# Patient Record
Sex: Male | Born: 1945 | Race: White | Hispanic: No | Marital: Married | Smoking: Never smoker
Health system: Southern US, Community
[De-identification: ages and names within clinical notes are randomized; demographics above are authoritative.]

## PROBLEM LIST (undated history)

## (undated) DIAGNOSIS — I2510 Atherosclerotic heart disease of native coronary artery without angina pectoris: Secondary | ICD-10-CM

## (undated) DIAGNOSIS — Z8679 Personal history of other diseases of the circulatory system: Secondary | ICD-10-CM

## (undated) DIAGNOSIS — R972 Elevated prostate specific antigen [PSA]: Secondary | ICD-10-CM

## (undated) DIAGNOSIS — K219 Gastro-esophageal reflux disease without esophagitis: Secondary | ICD-10-CM

## (undated) DIAGNOSIS — Z8719 Personal history of other diseases of the digestive system: Secondary | ICD-10-CM

## (undated) DIAGNOSIS — E349 Endocrine disorder, unspecified: Secondary | ICD-10-CM

## (undated) DIAGNOSIS — J9600 Acute respiratory failure, unspecified whether with hypoxia or hypercapnia: Secondary | ICD-10-CM

## (undated) DIAGNOSIS — N183 Chronic kidney disease, stage 3 unspecified: Secondary | ICD-10-CM

## (undated) DIAGNOSIS — M109 Gout, unspecified: Secondary | ICD-10-CM

## (undated) DIAGNOSIS — R001 Bradycardia, unspecified: Secondary | ICD-10-CM

## (undated) DIAGNOSIS — F1090 Alcohol use, unspecified, uncomplicated: Secondary | ICD-10-CM

## (undated) DIAGNOSIS — M48061 Spinal stenosis, lumbar region without neurogenic claudication: Secondary | ICD-10-CM

## (undated) DIAGNOSIS — I441 Atrioventricular block, second degree: Secondary | ICD-10-CM

## (undated) DIAGNOSIS — G4730 Sleep apnea, unspecified: Secondary | ICD-10-CM

## (undated) DIAGNOSIS — K4020 Bilateral inguinal hernia, without obstruction or gangrene, not specified as recurrent: Secondary | ICD-10-CM

## (undated) DIAGNOSIS — R0602 Shortness of breath: Secondary | ICD-10-CM

## (undated) DIAGNOSIS — C801 Malignant (primary) neoplasm, unspecified: Secondary | ICD-10-CM

## (undated) DIAGNOSIS — Z789 Other specified health status: Secondary | ICD-10-CM

## (undated) DIAGNOSIS — N400 Enlarged prostate without lower urinary tract symptoms: Secondary | ICD-10-CM

## (undated) DIAGNOSIS — D72829 Elevated white blood cell count, unspecified: Secondary | ICD-10-CM

## (undated) DIAGNOSIS — K5730 Diverticulosis of large intestine without perforation or abscess without bleeding: Secondary | ICD-10-CM

## (undated) DIAGNOSIS — E1165 Type 2 diabetes mellitus with hyperglycemia: Secondary | ICD-10-CM

## (undated) DIAGNOSIS — K8050 Calculus of bile duct without cholangitis or cholecystitis without obstruction: Secondary | ICD-10-CM

## (undated) DIAGNOSIS — G4733 Obstructive sleep apnea (adult) (pediatric): Secondary | ICD-10-CM

## (undated) DIAGNOSIS — I071 Rheumatic tricuspid insufficiency: Secondary | ICD-10-CM

## (undated) DIAGNOSIS — G4700 Insomnia, unspecified: Secondary | ICD-10-CM

## (undated) DIAGNOSIS — K8020 Calculus of gallbladder without cholecystitis without obstruction: Secondary | ICD-10-CM

## (undated) DIAGNOSIS — M1990 Unspecified osteoarthritis, unspecified site: Secondary | ICD-10-CM

## (undated) DIAGNOSIS — H409 Unspecified glaucoma: Secondary | ICD-10-CM

## (undated) DIAGNOSIS — I10 Essential (primary) hypertension: Secondary | ICD-10-CM

## (undated) DIAGNOSIS — Z7982 Long term (current) use of aspirin: Secondary | ICD-10-CM

## (undated) DIAGNOSIS — M5137 Other intervertebral disc degeneration, lumbosacral region: Secondary | ICD-10-CM

## (undated) DIAGNOSIS — M51379 Other intervertebral disc degeneration, lumbosacral region without mention of lumbar back pain or lower extremity pain: Secondary | ICD-10-CM

## (undated) DIAGNOSIS — M419 Scoliosis, unspecified: Secondary | ICD-10-CM

## (undated) DIAGNOSIS — K2270 Barrett's esophagus without dysplasia: Secondary | ICD-10-CM

## (undated) DIAGNOSIS — N529 Male erectile dysfunction, unspecified: Secondary | ICD-10-CM

## (undated) DIAGNOSIS — D649 Anemia, unspecified: Secondary | ICD-10-CM

## (undated) DIAGNOSIS — I499 Cardiac arrhythmia, unspecified: Secondary | ICD-10-CM

## (undated) DIAGNOSIS — Z960 Presence of urogenital implants: Secondary | ICD-10-CM

## (undated) DIAGNOSIS — E782 Mixed hyperlipidemia: Secondary | ICD-10-CM

## (undated) DIAGNOSIS — C61 Malignant neoplasm of prostate: Secondary | ICD-10-CM

## (undated) HISTORY — DX: Elevated white blood cell count, unspecified: D72.829

## (undated) HISTORY — PX: APPENDECTOMY: SHX54

## (undated) HISTORY — DX: Unspecified glaucoma: H40.9

## (undated) HISTORY — DX: Type 2 diabetes mellitus with hyperglycemia: E11.65

## (undated) HISTORY — PX: CORONARY ANGIOPLASTY WITH STENT PLACEMENT: SHX49

## (undated) HISTORY — PX: TOTAL SHOULDER ARTHROPLASTY: SHX126

## (undated) HISTORY — DX: Unspecified osteoarthritis, unspecified site: M19.90

## (undated) HISTORY — DX: Bradycardia, unspecified: R00.1

## (undated) HISTORY — DX: Essential (primary) hypertension: I10

## (undated) HISTORY — DX: Elevated prostate specific antigen (PSA): R97.20

## (undated) HISTORY — DX: Other intervertebral disc degeneration, lumbosacral region: M51.37

## (undated) HISTORY — PX: CHOLECYSTECTOMY: SHX55

## (undated) HISTORY — DX: Endocrine disorder, unspecified: E34.9

## (undated) HISTORY — DX: Mixed hyperlipidemia: E78.2

## (undated) HISTORY — PX: UPPER GI ENDOSCOPY: SHX6162

## (undated) HISTORY — DX: Obstructive sleep apnea (adult) (pediatric): G47.33

## (undated) HISTORY — PX: COLONOSCOPY: SHX174

## (undated) HISTORY — DX: Shortness of breath: R06.02

## (undated) HISTORY — DX: Sleep apnea, unspecified: G47.30

## (undated) HISTORY — DX: Barrett's esophagus without dysplasia: K22.70

## (undated) HISTORY — DX: Rheumatic tricuspid insufficiency: I07.1

## (undated) HISTORY — DX: Other intervertebral disc degeneration, lumbosacral region without mention of lumbar back pain or lower extremity pain: M51.379

## (undated) HISTORY — PX: TONSILLECTOMY: SUR1361

## (undated) HISTORY — DX: Atherosclerotic heart disease of native coronary artery without angina pectoris: I25.10

---

## 1998-03-05 HISTORY — PX: PENILE PROSTHESIS IMPLANT: SHX240

## 1999-03-06 HISTORY — PX: CORONARY ANGIOPLASTY WITH STENT PLACEMENT: SHX49

## 2008-05-14 ENCOUNTER — Ambulatory Visit: Payer: Self-pay | Admitting: Orthopedic Surgery

## 2008-08-30 ENCOUNTER — Emergency Department: Payer: Self-pay | Admitting: Emergency Medicine

## 2008-09-11 ENCOUNTER — Emergency Department: Payer: Self-pay | Admitting: Emergency Medicine

## 2008-10-05 ENCOUNTER — Ambulatory Visit: Payer: Self-pay | Admitting: Urology

## 2008-10-25 ENCOUNTER — Ambulatory Visit: Payer: Self-pay | Admitting: Gastroenterology

## 2009-10-06 ENCOUNTER — Other Ambulatory Visit: Payer: Self-pay | Admitting: Urology

## 2009-10-19 ENCOUNTER — Encounter: Payer: Self-pay | Admitting: Urology

## 2009-11-03 ENCOUNTER — Encounter: Payer: Self-pay | Admitting: Urology

## 2010-02-24 ENCOUNTER — Ambulatory Visit: Payer: Self-pay | Admitting: General Practice

## 2010-03-27 ENCOUNTER — Ambulatory Visit: Payer: Self-pay | Admitting: General Practice

## 2010-04-10 ENCOUNTER — Inpatient Hospital Stay: Payer: Self-pay | Admitting: General Practice

## 2010-04-13 LAB — PATHOLOGY REPORT

## 2010-06-01 ENCOUNTER — Ambulatory Visit: Payer: Self-pay | Admitting: Internal Medicine

## 2010-06-04 ENCOUNTER — Ambulatory Visit: Payer: Self-pay | Admitting: Internal Medicine

## 2010-07-04 ENCOUNTER — Ambulatory Visit: Payer: Self-pay | Admitting: Internal Medicine

## 2010-08-04 ENCOUNTER — Ambulatory Visit: Payer: Self-pay | Admitting: Internal Medicine

## 2010-09-03 ENCOUNTER — Ambulatory Visit: Payer: Self-pay | Admitting: Internal Medicine

## 2010-10-04 ENCOUNTER — Ambulatory Visit: Payer: Self-pay | Admitting: Internal Medicine

## 2010-11-04 ENCOUNTER — Ambulatory Visit: Payer: Self-pay | Admitting: Internal Medicine

## 2010-12-04 ENCOUNTER — Ambulatory Visit: Payer: Self-pay | Admitting: Internal Medicine

## 2011-01-04 ENCOUNTER — Ambulatory Visit: Payer: Self-pay | Admitting: Internal Medicine

## 2011-02-03 ENCOUNTER — Ambulatory Visit: Payer: Self-pay | Admitting: Internal Medicine

## 2011-03-06 ENCOUNTER — Ambulatory Visit: Payer: Self-pay | Admitting: Internal Medicine

## 2011-04-02 LAB — CANCER CENTER HEMATOCRIT: HCT: 45.3 % (ref 40.0–52.0)

## 2011-04-06 ENCOUNTER — Ambulatory Visit: Payer: Self-pay | Admitting: Internal Medicine

## 2011-04-30 LAB — CANCER CENTER HEMATOCRIT: HCT: 46.9 % (ref 40.0–52.0)

## 2011-05-04 ENCOUNTER — Ambulatory Visit: Payer: Self-pay | Admitting: Internal Medicine

## 2011-05-28 LAB — CBC CANCER CENTER
Basophil %: 0.5 %
Eosinophil %: 1.4 %
HGB: 15 g/dL (ref 13.0–18.0)
Lymphocyte %: 25.7 %
MCH: 26.1 pg (ref 26.0–34.0)
MCHC: 32.1 g/dL (ref 32.0–36.0)
Monocyte #: 1.3 x10 3/mm — ABNORMAL HIGH (ref 0.0–0.7)
Monocyte %: 14.8 %
Neutrophil #: 5.2 x10 3/mm (ref 1.4–6.5)
Platelet: 188 x10 3/mm (ref 150–440)
RBC: 5.77 10*6/uL (ref 4.40–5.90)
WBC: 9 x10 3/mm (ref 3.8–10.6)

## 2011-06-04 ENCOUNTER — Ambulatory Visit: Payer: Self-pay | Admitting: Internal Medicine

## 2011-07-04 ENCOUNTER — Ambulatory Visit: Payer: Self-pay | Admitting: Internal Medicine

## 2011-07-25 LAB — CANCER CENTER HEMATOCRIT: HCT: 47.9 % (ref 40.0–52.0)

## 2011-08-04 ENCOUNTER — Ambulatory Visit: Payer: Self-pay | Admitting: Internal Medicine

## 2011-09-19 ENCOUNTER — Ambulatory Visit: Payer: Self-pay | Admitting: Internal Medicine

## 2011-09-20 LAB — PSA: PSA: 6 ng/mL — ABNORMAL HIGH (ref 0.0–4.0)

## 2011-10-04 ENCOUNTER — Ambulatory Visit: Payer: Self-pay | Admitting: Internal Medicine

## 2011-10-31 LAB — CBC CANCER CENTER
Basophil #: 0.1 x10 3/mm (ref 0.0–0.1)
Basophil %: 1.1 %
Eosinophil #: 0.1 x10 3/mm (ref 0.0–0.7)
Eosinophil %: 1.7 %
HCT: 47.7 % (ref 40.0–52.0)
HGB: 14.8 g/dL (ref 13.0–18.0)
Lymphocyte #: 2.3 x10 3/mm (ref 1.0–3.6)
Lymphocyte %: 30.2 %
MCH: 25.5 pg — ABNORMAL LOW (ref 26.0–34.0)
MCHC: 31.1 g/dL — ABNORMAL LOW (ref 32.0–36.0)
MCV: 82 fL (ref 80–100)
Monocyte #: 1.2 x10 3/mm — ABNORMAL HIGH (ref 0.2–1.0)
Neutrophil %: 51.8 %
RBC: 5.8 10*6/uL (ref 4.40–5.90)
RDW: 16.2 % — ABNORMAL HIGH (ref 11.5–14.5)
WBC: 7.7 x10 3/mm (ref 3.8–10.6)

## 2011-11-04 ENCOUNTER — Ambulatory Visit: Payer: Self-pay | Admitting: Internal Medicine

## 2011-12-26 ENCOUNTER — Ambulatory Visit: Payer: Self-pay | Admitting: Internal Medicine

## 2011-12-26 LAB — CANCER CENTER HEMATOCRIT: HCT: 48.2 % (ref 40.0–52.0)

## 2012-01-04 ENCOUNTER — Ambulatory Visit: Payer: Self-pay | Admitting: Internal Medicine

## 2012-02-03 ENCOUNTER — Ambulatory Visit: Payer: Self-pay | Admitting: Internal Medicine

## 2012-02-20 LAB — CANCER CENTER HEMATOCRIT: HCT: 46.8 % (ref 40.0–52.0)

## 2012-03-05 ENCOUNTER — Ambulatory Visit: Payer: Self-pay | Admitting: Internal Medicine

## 2012-03-05 HISTORY — PX: SHOULDER SURGERY: SHX246

## 2012-04-05 ENCOUNTER — Ambulatory Visit: Payer: Self-pay | Admitting: Internal Medicine

## 2012-04-30 LAB — CANCER CENTER HEMATOCRIT: HCT: 48.3 % (ref 40.0–52.0)

## 2012-05-01 LAB — PSA: PSA: 3.2 ng/mL (ref 0.0–4.0)

## 2012-05-03 ENCOUNTER — Ambulatory Visit: Payer: Self-pay | Admitting: Internal Medicine

## 2012-06-10 ENCOUNTER — Ambulatory Visit: Payer: Self-pay | Admitting: Internal Medicine

## 2012-06-11 LAB — CBC CANCER CENTER
Basophil %: 0.9 %
HCT: 44.6 % (ref 40.0–52.0)
HGB: 14.3 g/dL (ref 13.0–18.0)
Lymphocyte #: 2.3 x10 3/mm (ref 1.0–3.6)
Lymphocyte %: 28.3 %
MCH: 27.8 pg (ref 26.0–34.0)
MCHC: 32.1 g/dL (ref 32.0–36.0)
MCV: 87 fL (ref 80–100)
Monocyte #: 1.1 x10 3/mm — ABNORMAL HIGH (ref 0.2–1.0)
Monocyte %: 13.6 %
Neutrophil #: 4.6 x10 3/mm (ref 1.4–6.5)
Platelet: 210 x10 3/mm (ref 150–440)

## 2012-07-03 ENCOUNTER — Ambulatory Visit: Payer: Self-pay | Admitting: Internal Medicine

## 2012-07-23 LAB — CANCER CENTER HEMATOCRIT: HCT: 44.9 % (ref 40.0–52.0)

## 2012-08-03 ENCOUNTER — Ambulatory Visit: Payer: Self-pay | Admitting: Internal Medicine

## 2012-09-09 ENCOUNTER — Ambulatory Visit: Payer: Self-pay | Admitting: Internal Medicine

## 2012-09-10 LAB — CANCER CENTER HEMATOCRIT: HCT: 44.6 %

## 2012-10-03 ENCOUNTER — Ambulatory Visit: Payer: Self-pay | Admitting: Internal Medicine

## 2012-10-21 ENCOUNTER — Ambulatory Visit: Payer: Self-pay | Admitting: Internal Medicine

## 2012-11-05 ENCOUNTER — Ambulatory Visit: Payer: Self-pay | Admitting: Internal Medicine

## 2012-11-10 ENCOUNTER — Ambulatory Visit: Payer: Self-pay | Admitting: Internal Medicine

## 2012-12-03 ENCOUNTER — Ambulatory Visit: Payer: Self-pay | Admitting: Internal Medicine

## 2012-12-31 LAB — CBC CANCER CENTER
Basophil #: 0.1 x10 3/mm (ref 0.0–0.1)
Basophil %: 0.9 %
Eosinophil #: 0.2 x10 3/mm (ref 0.0–0.7)
HCT: 47.6 % (ref 40.0–52.0)
HGB: 15.1 g/dL (ref 13.0–18.0)
Lymphocyte %: 28.3 %
MCH: 28.4 pg (ref 26.0–34.0)
MCHC: 31.8 g/dL — ABNORMAL LOW (ref 32.0–36.0)
MCV: 89 fL (ref 80–100)
Monocyte #: 1.4 x10 3/mm — ABNORMAL HIGH (ref 0.2–1.0)
Monocyte %: 15.1 %
Neutrophil %: 53.3 %
RDW: 14.9 % — ABNORMAL HIGH (ref 11.5–14.5)
WBC: 9.6 x10 3/mm (ref 3.8–10.6)

## 2013-01-03 ENCOUNTER — Ambulatory Visit: Payer: Self-pay | Admitting: Internal Medicine

## 2013-02-18 ENCOUNTER — Ambulatory Visit: Payer: Self-pay | Admitting: Internal Medicine

## 2013-02-18 LAB — CANCER CENTER HEMATOCRIT: HCT: 47.5 % (ref 40.0–52.0)

## 2013-03-05 ENCOUNTER — Ambulatory Visit: Payer: Self-pay | Admitting: Internal Medicine

## 2013-04-14 ENCOUNTER — Ambulatory Visit: Payer: Self-pay | Admitting: Internal Medicine

## 2013-04-15 LAB — CANCER CENTER HEMATOCRIT: HCT: 46.6 % (ref 40.0–52.0)

## 2013-05-03 ENCOUNTER — Ambulatory Visit: Payer: Self-pay | Admitting: Internal Medicine

## 2013-06-09 ENCOUNTER — Ambulatory Visit: Payer: Self-pay | Admitting: Internal Medicine

## 2013-06-10 LAB — CBC CANCER CENTER
Basophil #: 0.1 x10 3/mm (ref 0.0–0.1)
Basophil %: 1 %
EOS ABS: 0.2 x10 3/mm (ref 0.0–0.7)
EOS PCT: 2.2 %
HCT: 47.9 % (ref 40.0–52.0)
HGB: 15.1 g/dL (ref 13.0–18.0)
Lymphocyte #: 2.5 x10 3/mm (ref 1.0–3.6)
Lymphocyte %: 30.4 %
MCH: 27.5 pg (ref 26.0–34.0)
MCHC: 31.5 g/dL — ABNORMAL LOW (ref 32.0–36.0)
MCV: 88 fL (ref 80–100)
MONO ABS: 1 x10 3/mm (ref 0.2–1.0)
MONOS PCT: 12.7 %
NEUTROS ABS: 4.4 x10 3/mm (ref 1.4–6.5)
Neutrophil %: 53.7 %
Platelet: 224 x10 3/mm (ref 150–440)
RBC: 5.47 10*6/uL (ref 4.40–5.90)
RDW: 15.1 % — ABNORMAL HIGH (ref 11.5–14.5)
WBC: 8.1 x10 3/mm (ref 3.8–10.6)

## 2013-06-10 LAB — IRON AND TIBC
IRON: 161 ug/dL (ref 65–175)
Iron Bind.Cap.(Total): 477 ug/dL — ABNORMAL HIGH (ref 250–450)
Iron Saturation: 34 %
UNBOUND IRON-BIND. CAP.: 316 ug/dL

## 2013-06-10 LAB — FERRITIN: Ferritin (ARMC): 9 ng/mL (ref 8–388)

## 2013-07-03 ENCOUNTER — Ambulatory Visit: Payer: Self-pay | Admitting: Internal Medicine

## 2013-08-10 ENCOUNTER — Ambulatory Visit: Payer: Self-pay | Admitting: Internal Medicine

## 2013-08-10 LAB — HEMATOCRIT: HCT: 48 % (ref 40.0–52.0)

## 2013-09-02 ENCOUNTER — Ambulatory Visit: Payer: Self-pay | Admitting: Internal Medicine

## 2013-09-30 DIAGNOSIS — R0602 Shortness of breath: Secondary | ICD-10-CM | POA: Insufficient documentation

## 2013-09-30 DIAGNOSIS — M1990 Unspecified osteoarthritis, unspecified site: Secondary | ICD-10-CM | POA: Insufficient documentation

## 2013-09-30 HISTORY — DX: Shortness of breath: R06.02

## 2013-09-30 HISTORY — DX: Unspecified osteoarthritis, unspecified site: M19.90

## 2013-10-12 ENCOUNTER — Ambulatory Visit: Payer: Self-pay | Admitting: Internal Medicine

## 2013-10-12 LAB — CBC CANCER CENTER
BASOS ABS: 0.1 x10 3/mm (ref 0.0–0.1)
BASOS PCT: 0.9 %
Eosinophil #: 0.1 x10 3/mm (ref 0.0–0.7)
Eosinophil %: 1.7 %
HCT: 47 % (ref 40.0–52.0)
HGB: 14.8 g/dL (ref 13.0–18.0)
LYMPHS PCT: 29.2 %
Lymphocyte #: 2.5 x10 3/mm (ref 1.0–3.6)
MCH: 28.2 pg (ref 26.0–34.0)
MCHC: 31.5 g/dL — ABNORMAL LOW (ref 32.0–36.0)
MCV: 90 fL (ref 80–100)
Monocyte #: 1.2 x10 3/mm — ABNORMAL HIGH (ref 0.2–1.0)
Monocyte %: 13.7 %
NEUTROS PCT: 54.5 %
Neutrophil #: 4.6 x10 3/mm (ref 1.4–6.5)
Platelet: 254 x10 3/mm (ref 150–440)
RBC: 5.24 10*6/uL (ref 4.40–5.90)
RDW: 14.9 % — ABNORMAL HIGH (ref 11.5–14.5)
WBC: 8.5 x10 3/mm (ref 3.8–10.6)

## 2013-10-12 LAB — OCCULT BLOOD X 1 CARD TO LAB, STOOL
OCCULT BLOOD, FECES: NEGATIVE
Occult Blood, Feces: NEGATIVE
Occult Blood, Feces: NEGATIVE

## 2013-11-03 ENCOUNTER — Ambulatory Visit: Payer: Self-pay | Admitting: Internal Medicine

## 2013-11-10 ENCOUNTER — Ambulatory Visit: Payer: Self-pay | Admitting: Gastroenterology

## 2013-11-12 LAB — PATHOLOGY REPORT

## 2013-11-16 DIAGNOSIS — E1165 Type 2 diabetes mellitus with hyperglycemia: Secondary | ICD-10-CM

## 2013-11-16 DIAGNOSIS — IMO0002 Reserved for concepts with insufficient information to code with codable children: Secondary | ICD-10-CM

## 2013-11-16 DIAGNOSIS — E119 Type 2 diabetes mellitus without complications: Secondary | ICD-10-CM | POA: Insufficient documentation

## 2013-11-16 DIAGNOSIS — R972 Elevated prostate specific antigen [PSA]: Secondary | ICD-10-CM

## 2013-11-16 HISTORY — DX: Elevated prostate specific antigen (PSA): R97.20

## 2013-11-16 HISTORY — DX: Reserved for concepts with insufficient information to code with codable children: IMO0002

## 2013-11-16 HISTORY — DX: Type 2 diabetes mellitus with hyperglycemia: E11.65

## 2014-02-23 ENCOUNTER — Ambulatory Visit: Payer: Self-pay | Admitting: Internal Medicine

## 2014-04-20 DIAGNOSIS — G4733 Obstructive sleep apnea (adult) (pediatric): Secondary | ICD-10-CM | POA: Insufficient documentation

## 2014-04-20 HISTORY — DX: Obstructive sleep apnea (adult) (pediatric): G47.33

## 2014-04-29 DIAGNOSIS — I071 Rheumatic tricuspid insufficiency: Secondary | ICD-10-CM

## 2014-04-29 HISTORY — DX: Rheumatic tricuspid insufficiency: I07.1

## 2014-10-11 DIAGNOSIS — I10 Essential (primary) hypertension: Secondary | ICD-10-CM | POA: Insufficient documentation

## 2014-10-11 HISTORY — DX: Essential (primary) hypertension: I10

## 2014-10-29 ENCOUNTER — Ambulatory Visit (INDEPENDENT_AMBULATORY_CARE_PROVIDER_SITE_OTHER): Payer: Medicare Other | Admitting: Urology

## 2014-10-29 ENCOUNTER — Encounter: Payer: Self-pay | Admitting: Urology

## 2014-10-29 VITALS — BP 168/91 | HR 64 | Ht 69.0 in | Wt 184.3 lb

## 2014-10-29 DIAGNOSIS — E782 Mixed hyperlipidemia: Secondary | ICD-10-CM

## 2014-10-29 DIAGNOSIS — N401 Enlarged prostate with lower urinary tract symptoms: Secondary | ICD-10-CM

## 2014-10-29 DIAGNOSIS — R972 Elevated prostate specific antigen [PSA]: Secondary | ICD-10-CM

## 2014-10-29 DIAGNOSIS — I10 Essential (primary) hypertension: Secondary | ICD-10-CM

## 2014-10-29 DIAGNOSIS — E291 Testicular hypofunction: Secondary | ICD-10-CM | POA: Diagnosis not present

## 2014-10-29 DIAGNOSIS — N529 Male erectile dysfunction, unspecified: Secondary | ICD-10-CM

## 2014-10-29 DIAGNOSIS — I2510 Atherosclerotic heart disease of native coronary artery without angina pectoris: Secondary | ICD-10-CM

## 2014-10-29 HISTORY — DX: Essential (primary) hypertension: I10

## 2014-10-29 HISTORY — DX: Mixed hyperlipidemia: E78.2

## 2014-10-29 HISTORY — DX: Atherosclerotic heart disease of native coronary artery without angina pectoris: I25.10

## 2014-10-29 LAB — URINALYSIS, COMPLETE
Bilirubin, UA: NEGATIVE
Glucose, UA: NEGATIVE
LEUKOCYTES UA: NEGATIVE
Nitrite, UA: NEGATIVE
PROTEIN UA: NEGATIVE
RBC, UA: NEGATIVE
SPEC GRAV UA: 1.025 (ref 1.005–1.030)
Urobilinogen, Ur: 0.2 mg/dL (ref 0.2–1.0)
pH, UA: 5.5 (ref 5.0–7.5)

## 2014-10-29 LAB — MICROSCOPIC EXAMINATION
BACTERIA UA: NONE SEEN
EPITHELIAL CELLS (NON RENAL): NONE SEEN /HPF (ref 0–10)
RBC MICROSCOPIC, UA: NONE SEEN /HPF (ref 0–?)
WBC, UA: NONE SEEN /hpf (ref 0–?)

## 2014-10-29 MED ORDER — SULFAMETHOXAZOLE-TRIMETHOPRIM 800-160 MG PO TABS: 1.0000 | ORAL_TABLET | Freq: Two times a day (BID) | ORAL | Status: DC

## 2014-10-29 NOTE — Progress Notes (Signed)
10/29/2014 4:22 PM   Jimmy Mcintyre 1945-11-10 [id number]  Referring provider: No referring provider defined for this encounter.  Chief Complaint  Patient presents with  . Elevated PSA    New Patient    HPI:  1 - Elevated PSA - rising PSA x several, has been counseled for Urol eval x several by PCP but now agreeable. On Finasteride x 15 years. Recent PSA History: all values ON FINASTERIDE AND TESTOSTERONE 11/2013 - 5.09 06/2014 - 7.19; 09/2014 - 7.72  2 - Hypogonadism  - On androgel x years for symptomatic hypogonadism, mostly from extreme fatigue. Denies h/o polycythemia.  3 - Erectile Dysfunction - s/p IPP implant and revision x2, most recently in [place] around 2010. Now with severe SST deformity and he is very reluctant to use.  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  PMH sig for CAD/Stent (now not limiting whatsoever), Dm2, OA, GERD. His PCP is Georgie Chard MD with [name].      PMH: Past Medical History  Diagnosis Date  . Benign essential HTN 10/11/2014  . Arteriosclerosis of coronary artery 10/29/2014    Overview:  pci stent of lad   . Essential (primary) hypertension 10/29/2014  . TI (tricuspid incompetence) 04/29/2014  . Obstructive apnea 04/20/2014  . Arthritis, degenerative 09/30/2013    Overview:   a.  Shoulders severe.   b.  Cervical spine.   c.  Lumbar spine   . Diabetes mellitus type 2, uncontrolled 11/16/2013  . Abnormal prostate specific antigen 11/16/2013  . 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 Breath shortness 09/30/2013  . Glaucoma   . Sleep apnea     Surgical History: Past Surgical History  Procedure Laterality Date  . Coronary angioplasty with stent placement  2001  . Penile prosthesis implant  2000  . Shoulder surgery Right 2014    Home Medications:    Medication List       This list is accurate  as of: 10/29/14  4:22 PM.  Always use your most recent med list.               amLODipine-benazepril 5-20 MG per capsule  Commonly known as:  LOTREL  TAKE 1 CAPSULE BY MOUTH EVERY DAY     ANDROGEL 20.25 MG/1.25GM (1.62%) Gel  Generic drug:  Testosterone  Apply topically.     aspirin 325 MG tablet  Take by mouth.     cyanocobalamin 1000 MCG/ML injection  Commonly known as:  (VITAMIN B-12)  Inject into the muscle.     finasteride 5 MG tablet  Commonly known as:  PROSCAR  Take by mouth.     fluticasone 50 MCG/ACT nasal spray  Commonly known as:  FLONASE  Place into the nose.     gemfibrozil 600 MG tablet  Commonly known as:  LOPID  TAKE 1 TABLET TWICE A DAY AS DIRECTED     glimepiride 2 MG tablet  Commonly known as:  AMARYL  Take by mouth.     HYDROcodone-acetaminophen 5-325 MG per tablet  Commonly known as:  NORCO/VICODIN  Take by mouth.     JUBLIA 10 % Soln  Generic drug:  Efinaconazole  APPLY TO NAILS EVERY DAY     loratadine-pseudoephedrine 5-120 MG per tablet  Commonly known as:  CLARITIN-D 12-hour  Take by mouth.     metoprolol succinate 25 MG 24 hr tablet  Commonly  known as:  TOPROL-XL  Take by mouth.     nabumetone 750 MG tablet  Commonly known as:  RELAFEN  TAKE 1 TABLET TWICE A DAY AS NEEDED FOR PAIN     omeprazole 20 MG capsule  Commonly known as:  PRILOSEC  Take by mouth.     simvastatin 20 MG tablet  Commonly known as:  ZOCOR  Take by mouth.     sulfamethoxazole-trimethoprim 800-160 MG per tablet  Commonly known as:  BACTRIM DS,SEPTRA DS  Take 1 tablet by mouth every 12 (twelve) hours. Begin 3 days before prostate biopsy.     zolpidem 12.5 MG CR tablet  Commonly known as:  AMBIEN CR  Take by mouth.        Allergies: No Known Allergies  Family History: Family History  Problem Relation Age of Onset  . Prostate cancer Neg Hx   . Bladder Cancer Neg Hx     Social History:  reports that he has never smoked. He does not have any  smokeless tobacco history on file. He reports that he drinks alcohol. He reports that he does not use illicit drugs.  ROS: UROLOGY Frequent Urination?: Yes Hard to postpone urination?: Yes Burning/pain with urination?: No Get up at night to urinate?: Yes Leakage of urine?: Yes Urine stream starts and stops?: No Trouble starting stream?: Yes Do you have to strain to urinate?: No Blood in urine?: No Urinary tract infection?: No Sexually transmitted disease?: No Injury to kidneys or bladder?: No Painful intercourse?: No Weak stream?: Yes Erection problems?: Yes Penile pain?: Yes  Gastrointestinal Nausea?: No Vomiting?: No Indigestion/heartburn?: No Diarrhea?: No Constipation?: No  Constitutional Fever: No Night sweats?: No Weight loss?: No Fatigue?: No  Skin Skin rash/lesions?: No Itching?: No  Eyes Blurred vision?: Yes Double vision?: No  Ears/Nose/Throat Sore throat?: No Sinus problems?: Yes  Hematologic/Lymphatic Swollen glands?: No Easy bruising?: No  Cardiovascular Leg swelling?: No Chest pain?: No  Respiratory Cough?: No Shortness of breath?: Yes  Endocrine Excessive thirst?: No  Musculoskeletal Back pain?: Yes Joint pain?: Yes  Neurological Headaches?: No Dizziness?: No  Psychologic Depression?: No Anxiety?: No  Physical Exam: BP 168/91 mmHg  Pulse 64  Ht 5\' 9"  (1.753 m)  Wt 184 lb 4.8 oz (83.598 kg)  BMI 27.20 kg/m2  Constitutional:  Alert and oriented, No acute distress. HEENT: [place] AT, moist mucus membranes.  Trachea midline, no masses. Cardiovascular: No clubbing, cyanosis, or edema. Respiratory: Normal respiratory effort, no increased work of breathing. GI: Abdomen is soft, nontender, nondistended, no abdominal masses GU: No CVA tenderness. IPP in place w/o erosion, pump in Rt hemiscrotum. DRE 45gm with significant left lateral induraiton.  Skin: No rashes, bruises or suspicious lesions. Lymph: No cervical or inguinal  adenopathy. Neurologic: Grossly intact, no focal deficits, moving all 4 extremities. Psychiatric: Normal mood and affect.  Laboratory Data: Lab Results  Component Value Date   WBC 8.5 10/12/2013   HGB 14.8 10/12/2013   HCT 47.0 10/12/2013   MCV 90 10/12/2013   PLT 254 10/12/2013     Urinalysis No results found for: COLORURINE, APPEARANCEUR, LABSPEC, PHURINE, GLUCOSEU, HGBUR, BILIRUBINUR, KETONESUR, PROTEINUR, UROBILINOGEN, NITRITE, LEUKOCYTESUR  Pertinent Imaging:   Assessment & Plan:   1 - Elevated PSA - rising PSA and absolute value, which corrected for finasteride at over 15 is concerning. Also sig left lateral induration on exam. Although he does have some comorbdity and age over 27 it is well controlled and he has excellent functional status. Certainly rec  biopsy to rule out high grade tumor, as would really need to come off testosterone if significant malignancy present. Risks, benefits, alternatives discussed as well as peri-procedure course in detail. He wants to proceed. Bactrim to start 3 days prior.  2 - Hypogonadism  - risks of this medication (increased CV disesae, prostate cancer, all cause-mortality) discussed. At this point he elects to continue.   3 - Erectile Dysfunction - has funcitoning IPP, just with some glans hypermobility which he is not interested in adressing at present. 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   Return in about 3 weeks (around 11/19/2014).  Alexis Frock, [facility] [address] [place], [place] [address] [phone]

## 2014-11-29 ENCOUNTER — Telehealth: Payer: Self-pay | Admitting: Urology

## 2014-11-29 ENCOUNTER — Telehealth: Payer: Self-pay

## 2014-11-29 NOTE — Telephone Encounter (Signed)
Pt called stating he is having a reaction to abx, bactrim. Medication was given prior to prostate bx. Pt states he developed a rash and his blood sugars jumped to 160 (his normal range is around 100). Pt has stopped abx. In office the day of procedure he will receive gentamycin injection and Levaquin PO. Can pt have both of these and does he need a different abx leading up to procedure? Pt also stated he does have an artifical shoulder and a cardiac stent. Please advise.

## 2014-11-30 ENCOUNTER — Other Ambulatory Visit: Payer: Self-pay | Admitting: Urology

## 2014-11-30 ENCOUNTER — Ambulatory Visit (INDEPENDENT_AMBULATORY_CARE_PROVIDER_SITE_OTHER): Payer: Medicare Other | Admitting: Urology

## 2014-11-30 VITALS — BP 153/96 | HR 67 | Ht 69.0 in | Wt 186.8 lb

## 2014-11-30 DIAGNOSIS — R972 Elevated prostate specific antigen [PSA]: Secondary | ICD-10-CM | POA: Diagnosis not present

## 2014-11-30 MED ORDER — GENTAMICIN SULFATE 40 MG/ML IJ SOLN
80.0000 mg | Freq: Once | INTRAMUSCULAR | Status: AC
  Administered 2014-11-30: 80 mg via INTRAMUSCULAR

## 2014-11-30 MED ORDER — LEVOFLOXACIN 500 MG PO TABS
500.0000 mg | ORAL_TABLET | Freq: Once | ORAL | Status: AC
  Administered 2014-11-30: 500 mg via ORAL

## 2014-11-30 NOTE — Progress Notes (Signed)
Prostate Biopsy Procedure   Informed consent was obtained after discussing risks/benefits of the procedure.  A time out was performed to ensure correct patient identity.   - Gentamicin given prophylactically - Levaquin 500 mg administered PO -Transrectal Ultrasound performed revealing a 42 gm prostate -No significant hypoechoic or median lobe noted - moderate prostatomalacia noted.   Procedure: - Prostate block performed using 10 cc 1% lidocaine and biopsies taken from sextant areas, a total of 12 under ultrasound guidance.  Post-Procedure: - Patient tolerated the procedure well - He was counseled to seek immediate medical attention if experiences any severe pain, significant bleeding, or fevers - Return in one week to discuss biopsy results

## 2014-12-07 ENCOUNTER — Ambulatory Visit: Payer: Medicare Other | Admitting: Urology

## 2014-12-07 LAB — PATHOLOGY REPORT

## 2014-12-09 ENCOUNTER — Ambulatory Visit (INDEPENDENT_AMBULATORY_CARE_PROVIDER_SITE_OTHER): Payer: Medicare Other | Admitting: Urology

## 2014-12-09 VITALS — BP 146/89 | HR 88 | Ht 69.0 in | Wt 186.5 lb

## 2014-12-09 DIAGNOSIS — C61 Malignant neoplasm of prostate: Secondary | ICD-10-CM | POA: Diagnosis not present

## 2014-12-09 DIAGNOSIS — E291 Testicular hypofunction: Secondary | ICD-10-CM | POA: Diagnosis not present

## 2014-12-09 DIAGNOSIS — N529 Male erectile dysfunction, unspecified: Secondary | ICD-10-CM | POA: Diagnosis not present

## 2014-12-09 DIAGNOSIS — R972 Elevated prostate specific antigen [PSA]: Secondary | ICD-10-CM

## 2014-12-09 DIAGNOSIS — N400 Enlarged prostate without lower urinary tract symptoms: Secondary | ICD-10-CM

## 2014-12-09 NOTE — Progress Notes (Signed)
12/09/2014 4:25 PM   [name] June 11, 1945 [id number]  Referring provider: No referring provider defined for this encounter.  Chief Complaint  Patient presents with  . Results    prostate biopsy results    HPI: Patient had persistently elevated PSA despite finasteride use. It was moving up from 5.0-7.72 over a year's time. Transrectal ultrasound biopsies done by Dr. Tammi Klippel results are the right-sided tumor none on the left. Multiple areas in the right side grade 3+4 and 4+3. There is one biopsy at 3+ but basically easily high-risk patient metastasis if treatment is not done. Present time course he has no bone pain. Using AndroGel and Androderm for erectile dysfunction. This was stopped. He can maintain his finasteride. Long discussion be done regarding the various procedure options and complications same.   PMH: Past Medical History  Diagnosis Date  . Benign essential HTN 10/11/2014  . Arteriosclerosis of coronary artery 10/29/2014    Overview:  pci stent of lad   . Essential (primary) hypertension 10/29/2014  . TI (tricuspid incompetence) 04/29/2014  . Obstructive apnea 04/20/2014  . Arthritis, degenerative 09/30/2013    Overview:   a.  Shoulders severe.   b.  Cervical spine.   c.  Lumbar spine   . Diabetes mellitus type 2, uncontrolled 11/16/2013  . Abnormal prostate specific antigen 11/16/2013  . 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 Breath shortness 09/30/2013  . Glaucoma   . Sleep apnea     Surgical History: Past Surgical History  Procedure Laterality Date  . Coronary angioplasty with stent placement  2001  . Penile prosthesis implant  2000  . Shoulder surgery Right 2014    Home Medications:    Medication List       This list is accurate as of: 12/09/14  4:25 PM.  Always use your most recent med list.               amLODipine-benazepril 5-20 MG capsule  Commonly known as:  LOTREL  TAKE 1 CAPSULE BY MOUTH EVERY DAY     ANDROGEL 20.25  MG/1.25GM (1.62%) Gel  Generic drug:  Testosterone  Apply topically.     aspirin 325 MG tablet  Take by mouth.     cyanocobalamin 1000 MCG/ML injection  Commonly known as:  (VITAMIN B-12)  Inject into the muscle.     finasteride 5 MG tablet  Commonly known as:  PROSCAR  Take by mouth.     fluticasone 50 MCG/ACT nasal spray  Commonly known as:  FLONASE  Place into the nose.     gemfibrozil 600 MG tablet  Commonly known as:  LOPID  TAKE 1 TABLET TWICE A DAY AS DIRECTED     glimepiride 2 MG tablet  Commonly known as:  AMARYL  Take by mouth.     HYDROcodone-acetaminophen 5-325 MG tablet  Commonly known as:  NORCO/VICODIN  Take by mouth.     JUBLIA 10 % Soln  Generic drug:  Efinaconazole  APPLY TO NAILS EVERY DAY     loratadine-pseudoephedrine 5-120 MG tablet  Commonly known as:  CLARITIN-D 12-hour  Take by mouth.     metoprolol succinate 25 MG 24 hr tablet  Commonly known as:  TOPROL-XL  Take by mouth.     nabumetone 750 MG tablet  Commonly known as:  RELAFEN  TAKE 1 TABLET TWICE A DAY AS NEEDED FOR PAIN     omeprazole 20 MG capsule  Commonly known as:  PRILOSEC  Take by mouth.  simvastatin 20 MG tablet  Commonly known as:  ZOCOR  Take by mouth.     zolpidem 12.5 MG CR tablet  Commonly known as:  AMBIEN CR  Take by mouth.        Allergies:  Allergies  Allergen Reactions  . Bactrim [Sulfamethoxazole-Trimethoprim] Rash    Family History: Family History  Problem Relation Age of Onset  . Prostate cancer Neg Hx   . Bladder Cancer Neg Hx     Social History:  reports that he has never smoked. He does not have any smokeless tobacco history on file. He reports that he drinks alcohol. He reports that he does not use illicit drugs.  ROS:                                        Physical Exam: BP 146/89 mmHg  Pulse 88  Ht 5\' 9"  (1.753 m)  Wt 186 lb 8 oz (84.596 kg)  BMI 27.53 kg/m2  Constitutional:  Alert and oriented, No  acute distress. HEENT: [place] AT, moist mucus membranes.  Trachea midline, no masses. Cardiovascular: No clubbing, cyanosis, or edema. Respiratory: Normal respiratory effort, no increased work of breathing. GI: Abdomen is soft, nontender, nondistended, no abdominal masses GU: No CVA tenderness. BPH grade 2 over 4 smooth nonnodular Skin: No rashes, bruises or suspicious lesions. Lymph: No cervical or inguinal adenopathy. Neurologic: Grossly intact, no focal deficits, moving all 4 extremities. Psychiatric: Normal mood and affect.  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 All on the right side of the plan is to do external beam radiation to be the best procedure for this patient. His H obviate's doing radical prostatectomy is a 69. All he options were explained to the patient including complications impotence and incontinence irritative bowel symptoms irritative bladder symptoms. I make sure he stops his AndroGel on his original PSA  Refer to Radiation Oncology  There are no diagnoses linked to this encounter.  Return in about 4 weeks (around 01/06/2015) for fu radiation consult.  Collier Flowers, [facility] [address] [place], [place] [address] [phone]

## 2014-12-10 ENCOUNTER — Other Ambulatory Visit: Payer: Self-pay | Admitting: Urology

## 2014-12-14 ENCOUNTER — Telehealth: Payer: Self-pay

## 2014-12-14 NOTE — Telephone Encounter (Signed)
  Oncology Nurse Navigator Documentation  Referral date to RadOnc/MedOnc: 12/14/14 (12/14/14 1000) Navigator Encounter Type: Introductory phone call (12/14/14 1000) Patient Visit Type: Radonc (12/14/14 1000)                    Time Spent with Patient: 15 (12/14/14 1000)   Notified of appt Monday 12/20/14 10:30 with Dr Baruch Gouty. Readback confirmed.

## 2014-12-20 ENCOUNTER — Encounter: Payer: Self-pay | Admitting: Radiation Oncology

## 2014-12-20 ENCOUNTER — Ambulatory Visit
Admission: RE | Admit: 2014-12-20 | Discharge: 2014-12-20 | Disposition: A | Discharge status: Home / Self Care | Payer: Medicare Other | Source: Ambulatory Visit | Attending: Radiation Oncology | Admitting: Radiation Oncology

## 2014-12-20 VITALS — BP 150/89 | HR 64 | Temp 96.5°F | Wt 184.1 lb

## 2014-12-20 DIAGNOSIS — I10 Essential (primary) hypertension: Secondary | ICD-10-CM | POA: Insufficient documentation

## 2014-12-20 DIAGNOSIS — Z7984 Long term (current) use of oral hypoglycemic drugs: Secondary | ICD-10-CM | POA: Insufficient documentation

## 2014-12-20 DIAGNOSIS — Z79899 Other long term (current) drug therapy: Secondary | ICD-10-CM | POA: Insufficient documentation

## 2014-12-20 DIAGNOSIS — I2510 Atherosclerotic heart disease of native coronary artery without angina pectoris: Secondary | ICD-10-CM | POA: Insufficient documentation

## 2014-12-20 DIAGNOSIS — Z510 Encounter for antineoplastic radiation therapy: Secondary | ICD-10-CM | POA: Insufficient documentation

## 2014-12-20 DIAGNOSIS — Z7982 Long term (current) use of aspirin: Secondary | ICD-10-CM | POA: Insufficient documentation

## 2014-12-20 DIAGNOSIS — N400 Enlarged prostate without lower urinary tract symptoms: Secondary | ICD-10-CM | POA: Insufficient documentation

## 2014-12-20 DIAGNOSIS — Z955 Presence of coronary angioplasty implant and graft: Secondary | ICD-10-CM | POA: Insufficient documentation

## 2014-12-20 DIAGNOSIS — E119 Type 2 diabetes mellitus without complications: Secondary | ICD-10-CM | POA: Insufficient documentation

## 2014-12-20 DIAGNOSIS — C61 Malignant neoplasm of prostate: Secondary | ICD-10-CM | POA: Insufficient documentation

## 2014-12-20 NOTE — Consult Note (Signed)
Except an outstanding is perfect of Radiation Oncology NEW PATIENT EVALUATION  Name: Jimmy Mcintyre  MRN: [id number]  Date:   12/20/2014     DOB: 1945/04/14   This 69 y.o. 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 Prostate Cancer    Consult    DIAGNOSIS: The encounter diagnosis was Malignant neoplasm of prostate ([place]).   PREVIOUS INVESTIGATIONS:  Pathology reports reviewed Clinical notes reviewed Bone scan not performed based on low PSA  HPI: Patient is a 69 year old male who was noticed from 1 year time for his PSA accelerated from 5-7.7. This prompted a transrectal ultrasound-guided biopsy showing tumor confined to the right side of his prostate with 6 biopsies positive for Gleason 7 (3+4). One core was 4+3. Patient has no history of bone pain based on his PSA no bone scan was performed. He has very little lower urinary tract symptoms. He has had an umbilical herniorrhaphy repair. He has used AndroGel and Androderm in the past for erectile dysfunction he has since discontinued that. He is currently on finasteride. He is seen today for radiation oncology opinion.  PLANNED TREATMENT REGIMEN: Image guided I MRT radiation therapy  PAST MEDICAL HISTORY:  has a past medical history of Benign essential HTN (10/11/2014); Arteriosclerosis of coronary artery (10/29/2014); Essential (primary) hypertension (10/29/2014); TI (tricuspid incompetence) (04/29/2014); Obstructive apnea (04/20/2014); Arthritis, degenerative (09/30/2013); Diabetes mellitus type 2, uncontrolled ([place]) (11/16/2013); Abnormal prostate specific antigen (11/16/2013); Combined fat and carbohydrate induced hyperlipemia (10/29/2014); Breath shortness (09/30/2013); Glaucoma; and Sleep apnea.    PAST SURGICAL HISTORY:  Past Surgical  History  Procedure Laterality Date  . Coronary angioplasty with stent placement  2001  . Penile prosthesis implant  2000  . Shoulder surgery Right 2014    FAMILY HISTORY: family history is negative for Prostate cancer and Bladder Cancer.  SOCIAL HISTORY:  reports that he has never smoked. He does not have any smokeless tobacco history on file. He reports that he drinks alcohol. He reports that he does not use illicit drugs.  ALLERGIES: Bactrim  MEDICATIONS:  Current Outpatient Prescriptions  Medication Sig Dispense Refill  . amLODipine-benazepril (LOTREL) 5-20 MG per capsule TAKE 1 CAPSULE BY MOUTH EVERY DAY    . aspirin 325 MG tablet Take by mouth.    . cyanocobalamin (,VITAMIN B-12,) 1000 MCG/ML injection Inject into the muscle.    . Efinaconazole (JUBLIA) 10 % SOLN APPLY TO NAILS EVERY DAY    . finasteride (PROSCAR) 5 MG tablet Take by mouth.    . fluticasone (FLONASE) 50 MCG/ACT nasal spray Place into the nose.    Marland Kitchen gemfibrozil (LOPID) 600 MG tablet TAKE 1 TABLET TWICE A DAY AS DIRECTED    . glimepiride (AMARYL) 2 MG tablet Take by mouth.    Marland Kitchen HYDROcodone-acetaminophen (NORCO/VICODIN) 5-325 MG per tablet Take by mouth.    . loratadine-pseudoephedrine (CLARITIN-D 12-HOUR) 5-120 MG per tablet Take by mouth.    . metoprolol succinate (TOPROL-XL) 25 MG 24 hr tablet Take by mouth.    . nabumetone (RELAFEN) 750 MG tablet TAKE 1 TABLET TWICE A DAY AS NEEDED FOR PAIN    . omeprazole (PRILOSEC) 20 MG capsule Take by mouth.    . simvastatin (ZOCOR) 20 MG tablet Take by mouth.    . Testosterone (ANDROGEL) 20.25 MG/1.25GM (  1.62%) GEL Apply topically.    Marland Kitchen zolpidem (AMBIEN CR) 12.5 MG CR tablet Take by mouth.     No current facility-administered medications for this encounter.    ECOG PERFORMANCE STATUS:  0 - Asymptomatic  REVIEW OF SYSTEMS:  Patient denies any weight loss, fatigue, weakness, fever, chills or night sweats. Patient denies any loss of vision, blurred vision. Patient  denies any ringing  of the ears or hearing loss. No irregular heartbeat. Patient denies heart murmur or history of fainting. Patient denies any chest pain or pain radiating to her upper extremities. Patient denies any shortness of breath, difficulty breathing at night, cough or hemoptysis. Patient denies any swelling in the lower legs. Patient denies any nausea vomiting, vomiting of blood, or coffee ground material in the vomitus. Patient denies any stomach pain. Patient states has had normal bowel movements no significant constipation or diarrhea. Patient denies any dysuria, hematuria or significant nocturia. Patient denies any problems walking, swelling in the joints or loss of balance. Patient denies any skin changes, loss of hair or loss of weight. Patient denies any excessive worrying or anxiety or significant depression. Patient denies any problems with insomnia. Patient denies excessive thirst, polyuria, polydipsia. Patient denies any swollen glands, patient denies easy bruising or easy bleeding. Patient denies any recent infections, allergies or URI. Patient "s visual fields have not changed significantly in recent time.    PHYSICAL EXAM: BP 150/89 mmHg  Pulse 64  Temp(Src) 96.5 F (35.8 C)  Wt 184 lb 1.4 oz (83.5 kg) On rectal exam rectal sphincter tone is good prostate is slightly enlarged in the left lateral lobe although the silk sulcus is preserved bilaterally. Induration of the left lobe may be secondary to recent biopsy. No other rectal abnormality is identified. Well-developed well-nourished patient in NAD. HEENT reveals PERLA, EOMI, discs not visualized.  Oral cavity is clear. No oral mucosal lesions are identified. Neck is clear without evidence of cervical or supraclavicular adenopathy. Lungs are clear to A&P. Cardiac examination is essentially unremarkable with regular rate and rhythm without murmur rub or thrill. Abdomen is benign with no organomegaly or masses noted. Motor sensory and  DTR levels are equal and symmetric in the upper and lower extremities. Cranial nerves II through XII are grossly intact. Proprioception is intact. No peripheral adenopathy or edema is identified. No motor or sensory levels are noted. Crude visual fields are within normal range.  LABORATORY DATA: Pathology report is reviewed    RADIOLOGY RESULTS: No current films for review   IMPRESSION: Stage IIa adenocarcinoma the prostate Gleason score of 68 in 69 year old male for image guided I MRT radiation therapy.  PLAN: I have run the [facility] for prostate cancer. His parameter show only a 37% chance of organ confined disease with 62% of extracapsular extension. His lymph node involvement is 4%. Based on his status I've recommended image guided I MRT radiation therapy. We treated his prostate and proximal seminal vesicles to 8000 cGy over 8 weeks. I've asked urology to place gold fiduciary markers in his prostate for daily image guided treatment. Risks and benefits of treatment including increased lower urinary tract symptoms, diarrhea, fatigue, alteration of blood counts, all were discussed in detail with the patient. His wife was also present. They both seem to comprehend my treatment plan well. I've also set up and ordered CT simulation after fiduciary markers are placed.  I would like to take this opportunity for allowing me to participate in the care of your patient.Marland Kitchen  Armstead Peaks., MD

## 2014-12-21 ENCOUNTER — Telehealth: Payer: Self-pay

## 2014-12-21 NOTE — Telephone Encounter (Signed)
  Oncology Nurse Navigator Documentation    Navigator Encounter Type: Telephone (12/21/14 0900)                      Time Spent with Patient: 15 (12/21/14 0900)   Jimmy Mcintyre had a few further questions regarding external beam radiation vs seeds. Instructed again on why Dr Baruch Gouty chose external beam therapy for his treatment option. No further questions at this time.

## 2014-12-27 ENCOUNTER — Telehealth: Payer: Self-pay

## 2014-12-27 NOTE — Telephone Encounter (Signed)
  Oncology Nurse Navigator Documentation    Navigator Encounter Type: Telephone (12/27/14 1700)                      Time Spent with Patient: 15 (12/27/14 1700)   Jimmy Mcintyre called and had further questions regarding side effects of radiation and protection of the urethra. Reiterated most common side effects of radiaton to the prostate and protection of the surrounding areas.

## 2015-01-04 ENCOUNTER — Other Ambulatory Visit: Payer: Self-pay

## 2015-01-04 ENCOUNTER — Ambulatory Visit (INDEPENDENT_AMBULATORY_CARE_PROVIDER_SITE_OTHER): Payer: Medicare Other | Admitting: Urology

## 2015-01-04 VITALS — BP 178/93 | HR 72 | Ht 69.0 in | Wt 185.8 lb

## 2015-01-04 DIAGNOSIS — C61 Malignant neoplasm of prostate: Secondary | ICD-10-CM

## 2015-01-04 MED ORDER — LEVOFLOXACIN 500 MG PO TABS
500.0000 mg | ORAL_TABLET | Freq: Once | ORAL | Status: AC
  Administered 2015-01-04: 500 mg via ORAL

## 2015-01-04 MED ORDER — GENTAMICIN SULFATE 40 MG/ML IJ SOLN
80.0000 mg | Freq: Once | INTRAMUSCULAR | Status: AC
  Administered 2015-01-04: 80 mg via INTRAMUSCULAR

## 2015-01-04 NOTE — Progress Notes (Signed)
Patient's to have external beam radiation by Dr. Donella Stade. I placed his Gold seed markers anteriorly in the midline and laterally and posterior portion prostate today. It is under local anesthetic. Utilizing the ultrasound. Patient tolerated the procedure well was seen in follow-up in radiation therapy for his IMR T. He was also given his Lupron injection today. PSA will be obtained

## 2015-01-06 ENCOUNTER — Ambulatory Visit
Admission: RE | Admit: 2015-01-06 | Discharge: 2015-01-06 | Disposition: A | Discharge status: Home / Self Care | Payer: Medicare Other | Source: Ambulatory Visit | Attending: Radiation Oncology | Admitting: Radiation Oncology

## 2015-01-06 DIAGNOSIS — Z7984 Long term (current) use of oral hypoglycemic drugs: Secondary | ICD-10-CM | POA: Diagnosis not present

## 2015-01-06 DIAGNOSIS — I10 Essential (primary) hypertension: Secondary | ICD-10-CM | POA: Diagnosis not present

## 2015-01-06 DIAGNOSIS — Z79899 Other long term (current) drug therapy: Secondary | ICD-10-CM | POA: Diagnosis not present

## 2015-01-06 DIAGNOSIS — Z955 Presence of coronary angioplasty implant and graft: Secondary | ICD-10-CM | POA: Diagnosis not present

## 2015-01-06 DIAGNOSIS — C61 Malignant neoplasm of prostate: Secondary | ICD-10-CM | POA: Diagnosis not present

## 2015-01-06 DIAGNOSIS — N400 Enlarged prostate without lower urinary tract symptoms: Secondary | ICD-10-CM | POA: Diagnosis not present

## 2015-01-06 DIAGNOSIS — Z7982 Long term (current) use of aspirin: Secondary | ICD-10-CM | POA: Diagnosis not present

## 2015-01-06 DIAGNOSIS — E119 Type 2 diabetes mellitus without complications: Secondary | ICD-10-CM | POA: Diagnosis not present

## 2015-01-06 DIAGNOSIS — I2510 Atherosclerotic heart disease of native coronary artery without angina pectoris: Secondary | ICD-10-CM | POA: Diagnosis not present

## 2015-01-06 DIAGNOSIS — Z510 Encounter for antineoplastic radiation therapy: Secondary | ICD-10-CM | POA: Diagnosis present

## 2015-01-14 DIAGNOSIS — Z510 Encounter for antineoplastic radiation therapy: Secondary | ICD-10-CM | POA: Diagnosis not present

## 2015-01-17 ENCOUNTER — Ambulatory Visit: Payer: Medicare Other

## 2015-01-17 ENCOUNTER — Other Ambulatory Visit: Payer: Self-pay | Admitting: *Deleted

## 2015-01-17 DIAGNOSIS — C61 Malignant neoplasm of prostate: Secondary | ICD-10-CM

## 2015-01-17 DIAGNOSIS — Z510 Encounter for antineoplastic radiation therapy: Secondary | ICD-10-CM | POA: Diagnosis not present

## 2015-01-18 ENCOUNTER — Ambulatory Visit: Payer: Medicare Other

## 2015-01-18 DIAGNOSIS — Z510 Encounter for antineoplastic radiation therapy: Secondary | ICD-10-CM | POA: Diagnosis not present

## 2015-01-19 ENCOUNTER — Ambulatory Visit: Payer: Medicare Other

## 2015-01-19 DIAGNOSIS — Z510 Encounter for antineoplastic radiation therapy: Secondary | ICD-10-CM | POA: Diagnosis not present

## 2015-01-20 ENCOUNTER — Ambulatory Visit: Payer: Medicare Other

## 2015-01-20 DIAGNOSIS — Z510 Encounter for antineoplastic radiation therapy: Secondary | ICD-10-CM | POA: Diagnosis not present

## 2015-01-21 ENCOUNTER — Ambulatory Visit: Payer: Medicare Other

## 2015-01-21 DIAGNOSIS — Z510 Encounter for antineoplastic radiation therapy: Secondary | ICD-10-CM | POA: Diagnosis not present

## 2015-01-24 ENCOUNTER — Ambulatory Visit
Admission: RE | Admit: 2015-01-24 | Discharge: 2015-01-24 | Disposition: A | Discharge status: Home / Self Care | Payer: Medicare Other | Source: Ambulatory Visit | Attending: Radiation Oncology | Admitting: Radiation Oncology

## 2015-01-24 DIAGNOSIS — Z510 Encounter for antineoplastic radiation therapy: Secondary | ICD-10-CM | POA: Diagnosis not present

## 2015-01-25 ENCOUNTER — Ambulatory Visit
Admission: RE | Admit: 2015-01-25 | Discharge: 2015-01-25 | Disposition: A | Discharge status: Home / Self Care | Payer: Medicare Other | Source: Ambulatory Visit | Attending: Radiation Oncology | Admitting: Radiation Oncology

## 2015-01-25 DIAGNOSIS — Z510 Encounter for antineoplastic radiation therapy: Secondary | ICD-10-CM | POA: Diagnosis not present

## 2015-01-26 ENCOUNTER — Ambulatory Visit
Admission: RE | Admit: 2015-01-26 | Discharge: 2015-01-26 | Disposition: A | Discharge status: Home / Self Care | Payer: Medicare Other | Source: Ambulatory Visit | Attending: Radiation Oncology | Admitting: Radiation Oncology

## 2015-01-26 ENCOUNTER — Inpatient Hospital Stay: Payer: Medicare Other | Attending: Radiation Oncology

## 2015-01-26 DIAGNOSIS — C61 Malignant neoplasm of prostate: Secondary | ICD-10-CM | POA: Diagnosis present

## 2015-01-26 DIAGNOSIS — Z510 Encounter for antineoplastic radiation therapy: Secondary | ICD-10-CM | POA: Diagnosis not present

## 2015-01-26 LAB — CBC
HEMATOCRIT: 49.6 % (ref 40.0–52.0)
Hemoglobin: 16.2 g/dL (ref 13.0–18.0)
MCH: 28.5 pg (ref 26.0–34.0)
MCHC: 32.7 g/dL (ref 32.0–36.0)
MCV: 87.1 fL (ref 80.0–100.0)
Platelets: 205 10*3/uL (ref 150–440)
RBC: 5.7 MIL/uL (ref 4.40–5.90)
RDW: 14.1 % (ref 11.5–14.5)
WBC: 7.4 10*3/uL (ref 3.8–10.6)

## 2015-01-31 ENCOUNTER — Ambulatory Visit
Admission: RE | Admit: 2015-01-31 | Discharge: 2015-01-31 | Disposition: A | Discharge status: Home / Self Care | Payer: Medicare Other | Source: Ambulatory Visit | Attending: Radiation Oncology | Admitting: Radiation Oncology

## 2015-01-31 DIAGNOSIS — Z510 Encounter for antineoplastic radiation therapy: Secondary | ICD-10-CM | POA: Diagnosis not present

## 2015-02-01 ENCOUNTER — Ambulatory Visit
Admission: RE | Admit: 2015-02-01 | Discharge: 2015-02-01 | Disposition: A | Discharge status: Home / Self Care | Payer: Medicare Other | Source: Ambulatory Visit | Attending: Radiation Oncology | Admitting: Radiation Oncology

## 2015-02-01 DIAGNOSIS — Z510 Encounter for antineoplastic radiation therapy: Secondary | ICD-10-CM | POA: Diagnosis not present

## 2015-02-02 ENCOUNTER — Ambulatory Visit
Admission: RE | Admit: 2015-02-02 | Discharge: 2015-02-02 | Disposition: A | Discharge status: Home / Self Care | Payer: Medicare Other | Source: Ambulatory Visit | Attending: Radiation Oncology | Admitting: Radiation Oncology

## 2015-02-02 DIAGNOSIS — Z510 Encounter for antineoplastic radiation therapy: Secondary | ICD-10-CM | POA: Diagnosis not present

## 2015-02-03 ENCOUNTER — Ambulatory Visit
Admission: RE | Admit: 2015-02-03 | Discharge: 2015-02-03 | Disposition: A | Discharge status: Home / Self Care | Payer: Medicare Other | Source: Ambulatory Visit | Attending: Radiation Oncology | Admitting: Radiation Oncology

## 2015-02-03 DIAGNOSIS — Z510 Encounter for antineoplastic radiation therapy: Secondary | ICD-10-CM | POA: Diagnosis not present

## 2015-02-04 ENCOUNTER — Ambulatory Visit
Admission: RE | Admit: 2015-02-04 | Discharge: 2015-02-04 | Disposition: A | Discharge status: Home / Self Care | Payer: Medicare Other | Source: Ambulatory Visit | Attending: Radiation Oncology | Admitting: Radiation Oncology

## 2015-02-04 DIAGNOSIS — Z510 Encounter for antineoplastic radiation therapy: Secondary | ICD-10-CM | POA: Diagnosis not present

## 2015-02-07 ENCOUNTER — Ambulatory Visit
Admission: RE | Admit: 2015-02-07 | Discharge: 2015-02-07 | Disposition: A | Discharge status: Home / Self Care | Payer: Medicare Other | Source: Ambulatory Visit | Attending: Radiation Oncology | Admitting: Radiation Oncology

## 2015-02-07 DIAGNOSIS — Z510 Encounter for antineoplastic radiation therapy: Secondary | ICD-10-CM | POA: Diagnosis not present

## 2015-02-08 ENCOUNTER — Ambulatory Visit
Admission: RE | Admit: 2015-02-08 | Discharge: 2015-02-08 | Disposition: A | Discharge status: Home / Self Care | Payer: Medicare Other | Source: Ambulatory Visit | Attending: Radiation Oncology | Admitting: Radiation Oncology

## 2015-02-08 DIAGNOSIS — Z510 Encounter for antineoplastic radiation therapy: Secondary | ICD-10-CM | POA: Diagnosis not present

## 2015-02-09 ENCOUNTER — Ambulatory Visit
Admission: RE | Admit: 2015-02-09 | Discharge: 2015-02-09 | Disposition: A | Discharge status: Home / Self Care | Payer: Medicare Other | Source: Ambulatory Visit | Attending: Radiation Oncology | Admitting: Radiation Oncology

## 2015-02-09 ENCOUNTER — Inpatient Hospital Stay: Payer: Medicare Other | Attending: Radiation Oncology

## 2015-02-09 DIAGNOSIS — C61 Malignant neoplasm of prostate: Secondary | ICD-10-CM | POA: Diagnosis present

## 2015-02-09 DIAGNOSIS — Z510 Encounter for antineoplastic radiation therapy: Secondary | ICD-10-CM | POA: Diagnosis not present

## 2015-02-09 LAB — CBC
HEMATOCRIT: 48.7 % (ref 40.0–52.0)
Hemoglobin: 15.9 g/dL (ref 13.0–18.0)
MCH: 28.6 pg (ref 26.0–34.0)
MCHC: 32.7 g/dL (ref 32.0–36.0)
MCV: 87.4 fL (ref 80.0–100.0)
Platelets: 185 10*3/uL (ref 150–440)
RBC: 5.57 MIL/uL (ref 4.40–5.90)
RDW: 14.7 % — AB (ref 11.5–14.5)
WBC: 6.8 10*3/uL (ref 3.8–10.6)

## 2015-02-10 ENCOUNTER — Ambulatory Visit
Admission: RE | Admit: 2015-02-10 | Discharge: 2015-02-10 | Disposition: A | Discharge status: Home / Self Care | Payer: Medicare Other | Source: Ambulatory Visit | Attending: Radiation Oncology | Admitting: Radiation Oncology

## 2015-02-10 DIAGNOSIS — Z510 Encounter for antineoplastic radiation therapy: Secondary | ICD-10-CM | POA: Diagnosis not present

## 2015-02-11 ENCOUNTER — Ambulatory Visit
Admission: RE | Admit: 2015-02-11 | Discharge: 2015-02-11 | Disposition: A | Discharge status: Home / Self Care | Payer: Medicare Other | Source: Ambulatory Visit | Attending: Radiation Oncology | Admitting: Radiation Oncology

## 2015-02-11 DIAGNOSIS — Z510 Encounter for antineoplastic radiation therapy: Secondary | ICD-10-CM | POA: Diagnosis not present

## 2015-02-14 ENCOUNTER — Ambulatory Visit
Admission: RE | Admit: 2015-02-14 | Discharge: 2015-02-14 | Disposition: A | Discharge status: Home / Self Care | Payer: Medicare Other | Source: Ambulatory Visit | Attending: Radiation Oncology | Admitting: Radiation Oncology

## 2015-02-14 DIAGNOSIS — Z510 Encounter for antineoplastic radiation therapy: Secondary | ICD-10-CM | POA: Diagnosis not present

## 2015-02-15 ENCOUNTER — Ambulatory Visit
Admission: RE | Admit: 2015-02-15 | Discharge: 2015-02-15 | Disposition: A | Discharge status: Home / Self Care | Payer: Medicare Other | Source: Ambulatory Visit | Attending: Radiation Oncology | Admitting: Radiation Oncology

## 2015-02-15 ENCOUNTER — Other Ambulatory Visit: Payer: Self-pay | Admitting: *Deleted

## 2015-02-15 DIAGNOSIS — Z510 Encounter for antineoplastic radiation therapy: Secondary | ICD-10-CM | POA: Diagnosis not present

## 2015-02-15 MED ORDER — HYDROCORTISONE 2.5 % RE CREA
1.0000 | TOPICAL_CREAM | Freq: Two times a day (BID) | RECTAL | Status: DC
Start: 2015-02-15 — End: 2015-11-21

## 2015-02-16 ENCOUNTER — Ambulatory Visit
Admission: RE | Admit: 2015-02-16 | Discharge: 2015-02-16 | Disposition: A | Discharge status: Home / Self Care | Payer: Medicare Other | Source: Ambulatory Visit | Attending: Radiation Oncology | Admitting: Radiation Oncology

## 2015-02-16 DIAGNOSIS — Z510 Encounter for antineoplastic radiation therapy: Secondary | ICD-10-CM | POA: Diagnosis not present

## 2015-02-17 ENCOUNTER — Ambulatory Visit
Admission: RE | Admit: 2015-02-17 | Discharge: 2015-02-17 | Disposition: A | Discharge status: Home / Self Care | Payer: Medicare Other | Source: Ambulatory Visit | Attending: Radiation Oncology | Admitting: Radiation Oncology

## 2015-02-17 DIAGNOSIS — Z510 Encounter for antineoplastic radiation therapy: Secondary | ICD-10-CM | POA: Diagnosis not present

## 2015-02-18 ENCOUNTER — Ambulatory Visit
Admission: RE | Admit: 2015-02-18 | Discharge: 2015-02-18 | Disposition: A | Discharge status: Home / Self Care | Payer: Medicare Other | Source: Ambulatory Visit | Attending: Radiation Oncology | Admitting: Radiation Oncology

## 2015-02-18 DIAGNOSIS — Z510 Encounter for antineoplastic radiation therapy: Secondary | ICD-10-CM | POA: Diagnosis not present

## 2015-02-21 ENCOUNTER — Ambulatory Visit
Admission: RE | Admit: 2015-02-21 | Discharge: 2015-02-21 | Disposition: A | Discharge status: Home / Self Care | Payer: Medicare Other | Source: Ambulatory Visit | Attending: Radiation Oncology | Admitting: Radiation Oncology

## 2015-02-21 DIAGNOSIS — Z510 Encounter for antineoplastic radiation therapy: Secondary | ICD-10-CM | POA: Diagnosis not present

## 2015-02-22 ENCOUNTER — Ambulatory Visit
Admission: RE | Admit: 2015-02-22 | Discharge: 2015-02-22 | Disposition: A | Discharge status: Home / Self Care | Payer: Medicare Other | Source: Ambulatory Visit | Attending: Radiation Oncology | Admitting: Radiation Oncology

## 2015-02-22 DIAGNOSIS — Z510 Encounter for antineoplastic radiation therapy: Secondary | ICD-10-CM | POA: Diagnosis not present

## 2015-02-23 ENCOUNTER — Ambulatory Visit
Admission: RE | Admit: 2015-02-23 | Discharge: 2015-02-23 | Disposition: A | Discharge status: Home / Self Care | Payer: Medicare Other | Source: Ambulatory Visit | Attending: Radiation Oncology | Admitting: Radiation Oncology

## 2015-02-23 ENCOUNTER — Inpatient Hospital Stay: Payer: Medicare Other | Admitting: *Deleted

## 2015-02-23 DIAGNOSIS — IMO0001 Reserved for inherently not codable concepts without codable children: Secondary | ICD-10-CM

## 2015-02-23 DIAGNOSIS — IMO0002 Reserved for concepts with insufficient information to code with codable children: Principal | ICD-10-CM

## 2015-02-23 DIAGNOSIS — C61 Malignant neoplasm of prostate: Secondary | ICD-10-CM | POA: Diagnosis not present

## 2015-02-23 DIAGNOSIS — Z510 Encounter for antineoplastic radiation therapy: Secondary | ICD-10-CM | POA: Diagnosis not present

## 2015-02-23 LAB — CBC WITH DIFFERENTIAL/PLATELET
BASOS ABS: 0.1 10*3/uL (ref 0–0.1)
Basophils Relative: 1 %
Eosinophils Absolute: 0.3 10*3/uL (ref 0–0.7)
Eosinophils Relative: 5 %
HEMATOCRIT: 47.4 % (ref 40.0–52.0)
Hemoglobin: 15.3 g/dL (ref 13.0–18.0)
LYMPHS ABS: 1.4 10*3/uL (ref 1.0–3.6)
LYMPHS PCT: 23 %
MCH: 28.3 pg (ref 26.0–34.0)
MCHC: 32.2 g/dL (ref 32.0–36.0)
MCV: 87.8 fL (ref 80.0–100.0)
MONO ABS: 0.9 10*3/uL (ref 0.2–1.0)
Monocytes Relative: 15 %
NEUTROS ABS: 3.3 10*3/uL (ref 1.4–6.5)
Neutrophils Relative %: 56 %
Platelets: 166 10*3/uL (ref 150–440)
RBC: 5.4 MIL/uL (ref 4.40–5.90)
RDW: 14.1 % (ref 11.5–14.5)
WBC: 5.9 10*3/uL (ref 3.8–10.6)

## 2015-02-24 ENCOUNTER — Ambulatory Visit
Admission: RE | Admit: 2015-02-24 | Discharge: 2015-02-24 | Disposition: A | Discharge status: Home / Self Care | Payer: Medicare Other | Source: Ambulatory Visit | Attending: Radiation Oncology | Admitting: Radiation Oncology

## 2015-02-24 DIAGNOSIS — Z510 Encounter for antineoplastic radiation therapy: Secondary | ICD-10-CM | POA: Diagnosis not present

## 2015-02-25 ENCOUNTER — Ambulatory Visit
Admission: RE | Admit: 2015-02-25 | Discharge: 2015-02-25 | Disposition: A | Discharge status: Home / Self Care | Payer: Medicare Other | Source: Ambulatory Visit | Attending: Radiation Oncology | Admitting: Radiation Oncology

## 2015-02-25 DIAGNOSIS — Z510 Encounter for antineoplastic radiation therapy: Secondary | ICD-10-CM | POA: Diagnosis not present

## 2015-03-01 ENCOUNTER — Ambulatory Visit
Admission: RE | Admit: 2015-03-01 | Discharge: 2015-03-01 | Disposition: A | Discharge status: Home / Self Care | Payer: Medicare Other | Source: Ambulatory Visit | Attending: Radiation Oncology | Admitting: Radiation Oncology

## 2015-03-01 DIAGNOSIS — Z510 Encounter for antineoplastic radiation therapy: Secondary | ICD-10-CM | POA: Diagnosis not present

## 2015-03-02 ENCOUNTER — Ambulatory Visit
Admission: RE | Admit: 2015-03-02 | Discharge: 2015-03-02 | Disposition: A | Discharge status: Home / Self Care | Payer: Medicare Other | Source: Ambulatory Visit | Attending: Radiation Oncology | Admitting: Radiation Oncology

## 2015-03-02 DIAGNOSIS — Z510 Encounter for antineoplastic radiation therapy: Secondary | ICD-10-CM | POA: Diagnosis not present

## 2015-03-03 ENCOUNTER — Ambulatory Visit
Admission: RE | Admit: 2015-03-03 | Discharge: 2015-03-03 | Disposition: A | Discharge status: Home / Self Care | Payer: Medicare Other | Source: Ambulatory Visit | Attending: Radiation Oncology | Admitting: Radiation Oncology

## 2015-03-03 DIAGNOSIS — Z510 Encounter for antineoplastic radiation therapy: Secondary | ICD-10-CM | POA: Diagnosis not present

## 2015-03-04 ENCOUNTER — Ambulatory Visit
Admission: RE | Admit: 2015-03-04 | Discharge: 2015-03-04 | Disposition: A | Discharge status: Home / Self Care | Payer: Medicare Other | Source: Ambulatory Visit | Attending: Radiation Oncology | Admitting: Radiation Oncology

## 2015-03-04 DIAGNOSIS — Z510 Encounter for antineoplastic radiation therapy: Secondary | ICD-10-CM | POA: Diagnosis not present

## 2015-03-07 ENCOUNTER — Ambulatory Visit: Payer: Medicare Other

## 2015-03-08 ENCOUNTER — Ambulatory Visit
Admission: RE | Admit: 2015-03-08 | Discharge: 2015-03-08 | Disposition: A | Discharge status: Home / Self Care | Payer: Medicare Other | Source: Ambulatory Visit | Attending: Radiation Oncology | Admitting: Radiation Oncology

## 2015-03-08 DIAGNOSIS — Z510 Encounter for antineoplastic radiation therapy: Secondary | ICD-10-CM | POA: Diagnosis not present

## 2015-03-09 ENCOUNTER — Ambulatory Visit
Admission: RE | Admit: 2015-03-09 | Discharge: 2015-03-09 | Disposition: A | Discharge status: Home / Self Care | Payer: Medicare Other | Source: Ambulatory Visit | Attending: Radiation Oncology | Admitting: Radiation Oncology

## 2015-03-09 DIAGNOSIS — Z510 Encounter for antineoplastic radiation therapy: Secondary | ICD-10-CM | POA: Diagnosis not present

## 2015-03-10 ENCOUNTER — Ambulatory Visit
Admission: RE | Admit: 2015-03-10 | Discharge: 2015-03-10 | Disposition: A | Discharge status: Home / Self Care | Payer: Medicare Other | Source: Ambulatory Visit | Attending: Radiation Oncology | Admitting: Radiation Oncology

## 2015-03-10 DIAGNOSIS — Z510 Encounter for antineoplastic radiation therapy: Secondary | ICD-10-CM | POA: Diagnosis not present

## 2015-03-11 ENCOUNTER — Ambulatory Visit
Admission: RE | Admit: 2015-03-11 | Discharge: 2015-03-11 | Disposition: A | Discharge status: Home / Self Care | Payer: Medicare Other | Source: Ambulatory Visit | Attending: Radiation Oncology | Admitting: Radiation Oncology

## 2015-03-11 DIAGNOSIS — Z510 Encounter for antineoplastic radiation therapy: Secondary | ICD-10-CM | POA: Diagnosis not present

## 2015-03-14 ENCOUNTER — Ambulatory Visit: Payer: Medicare Other

## 2015-03-15 ENCOUNTER — Ambulatory Visit
Admission: RE | Admit: 2015-03-15 | Discharge: 2015-03-15 | Disposition: A | Discharge status: Home / Self Care | Payer: Medicare Other | Source: Ambulatory Visit | Attending: Radiation Oncology | Admitting: Radiation Oncology

## 2015-03-15 DIAGNOSIS — Z510 Encounter for antineoplastic radiation therapy: Secondary | ICD-10-CM | POA: Diagnosis not present

## 2015-03-16 ENCOUNTER — Ambulatory Visit
Admission: RE | Admit: 2015-03-16 | Discharge: 2015-03-16 | Disposition: A | Discharge status: Home / Self Care | Payer: Medicare Other | Source: Ambulatory Visit | Attending: Radiation Oncology | Admitting: Radiation Oncology

## 2015-03-16 DIAGNOSIS — Z510 Encounter for antineoplastic radiation therapy: Secondary | ICD-10-CM | POA: Diagnosis not present

## 2015-03-17 ENCOUNTER — Ambulatory Visit
Admission: RE | Admit: 2015-03-17 | Discharge: 2015-03-17 | Disposition: A | Discharge status: Home / Self Care | Payer: Medicare Other | Source: Ambulatory Visit | Attending: Radiation Oncology | Admitting: Radiation Oncology

## 2015-03-17 ENCOUNTER — Ambulatory Visit: Payer: Medicare Other

## 2015-03-17 DIAGNOSIS — Z510 Encounter for antineoplastic radiation therapy: Secondary | ICD-10-CM | POA: Diagnosis not present

## 2015-03-18 ENCOUNTER — Ambulatory Visit: Payer: Medicare Other

## 2015-03-18 ENCOUNTER — Ambulatory Visit
Admission: RE | Admit: 2015-03-18 | Discharge: 2015-03-18 | Disposition: A | Discharge status: Home / Self Care | Payer: Medicare Other | Source: Ambulatory Visit | Attending: Radiation Oncology | Admitting: Radiation Oncology

## 2015-03-18 DIAGNOSIS — Z510 Encounter for antineoplastic radiation therapy: Secondary | ICD-10-CM | POA: Diagnosis not present

## 2015-03-21 ENCOUNTER — Ambulatory Visit
Admission: RE | Admit: 2015-03-21 | Discharge: 2015-03-21 | Disposition: A | Discharge status: Home / Self Care | Payer: Medicare Other | Source: Ambulatory Visit | Attending: Radiation Oncology | Admitting: Radiation Oncology

## 2015-03-21 DIAGNOSIS — Z510 Encounter for antineoplastic radiation therapy: Secondary | ICD-10-CM | POA: Diagnosis not present

## 2015-03-21 NOTE — Telephone Encounter (Signed)
Encounter created in error

## 2015-04-20 ENCOUNTER — Ambulatory Visit
Admission: RE | Admit: 2015-04-20 | Discharge: 2015-04-20 | Disposition: A | Discharge status: Home / Self Care | Payer: Medicare Other | Source: Ambulatory Visit | Attending: Radiation Oncology | Admitting: Radiation Oncology

## 2015-04-20 ENCOUNTER — Encounter: Payer: Self-pay | Admitting: Radiation Oncology

## 2015-04-20 VITALS — BP 148/87 | HR 67 | Temp 96.9°F | Resp 20 | Ht 69.0 in | Wt 186.9 lb

## 2015-04-20 DIAGNOSIS — C61 Malignant neoplasm of prostate: Secondary | ICD-10-CM

## 2015-04-20 NOTE — Progress Notes (Signed)
Radiation Oncology Follow up Note  Name: Jimmy Mcintyre   Date:   04/20/2015 MRN:  [id number] DOB: Jun 05, 1945    This 70 y.o. male presents to the clinic today for follow-up for stage IIa prostate cancer 1 month outIMRT treatment.  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 He is seen today in routine follow-up one month out and is doing well. He states his nocturia has improved. Still has some slight urgency and frequency. No significant diarrhea at this time..  COMPLICATIONS OF TREATMENT: none  FOLLOW UP COMPLIANCE: keeps appointments   PHYSICAL EXAM:  BP 148/87 mmHg  Pulse 67  Temp(Src) 96.9 F (36.1 C)  Resp 20  Ht 5\' 9"  (1.753 m)  Wt 186 lb 15.2 oz (84.8 kg)  BMI 27.60 kg/m2 On rectal exam rectal sphincter tone is good. Prostate is smooth contracted without evidence of nodularity or mass. Sulcus is preserved bilaterally. No discrete nodularity is identified. No other rectal abnormalities are noted. Well-developed well-nourished patient in NAD. HEENT reveals PERLA, EOMI, discs not visualized.  Oral cavity is clear. No oral mucosal lesions are identified. Neck is clear without evidence of cervical or supraclavicular adenopathy. Lungs are clear to A&P. Cardiac examination is essentially unremarkable with regular rate and rhythm without murmur rub or thrill. Abdomen is benign with no organomegaly or masses noted. Motor sensory and DTR levels are equal and symmetric in the upper and lower extremities. Cranial nerves II through XII are grossly intact. Proprioception is intact. No peripheral adenopathy or edema is identified. No motor or sensory levels are noted. Crude visual fields are within normal range.  RADIOLOGY RESULTS: No current films for review  PLAN: At the present time he is doing well recovering nicely from  his radiation therapy treatments. I am please was overall progress. I've explained to my PSA protocol and I've asked to see him back in 3-4 months for follow-up at which time PSA will be performed. He is scheduled for follow-up with urology. Patient knows to call with any concerns.  I would like to take this opportunity for allowing me to participate in the care of your patient.Armstead Peaks., MD

## 2015-04-22 ENCOUNTER — Ambulatory Visit: Payer: Medicare Other | Admitting: Urology

## 2015-05-19 ENCOUNTER — Emergency Department
Admission: EM | Admit: 2015-05-19 | Discharge: 2015-05-19 | Disposition: A | Discharge status: Home / Self Care | Payer: Medicare Other | Attending: Emergency Medicine | Admitting: Emergency Medicine

## 2015-05-19 ENCOUNTER — Encounter: Payer: Self-pay | Admitting: Emergency Medicine

## 2015-05-19 ENCOUNTER — Emergency Department: Payer: Medicare Other

## 2015-05-19 DIAGNOSIS — Z955 Presence of coronary angioplasty implant and graft: Secondary | ICD-10-CM | POA: Diagnosis not present

## 2015-05-19 DIAGNOSIS — I2510 Atherosclerotic heart disease of native coronary artery without angina pectoris: Secondary | ICD-10-CM | POA: Insufficient documentation

## 2015-05-19 DIAGNOSIS — Z7984 Long term (current) use of oral hypoglycemic drugs: Secondary | ICD-10-CM | POA: Insufficient documentation

## 2015-05-19 DIAGNOSIS — E785 Hyperlipidemia, unspecified: Secondary | ICD-10-CM | POA: Insufficient documentation

## 2015-05-19 DIAGNOSIS — Z7982 Long term (current) use of aspirin: Secondary | ICD-10-CM | POA: Diagnosis not present

## 2015-05-19 DIAGNOSIS — I361 Nonrheumatic tricuspid (valve) insufficiency: Secondary | ICD-10-CM | POA: Diagnosis not present

## 2015-05-19 DIAGNOSIS — E1165 Type 2 diabetes mellitus with hyperglycemia: Secondary | ICD-10-CM | POA: Diagnosis not present

## 2015-05-19 DIAGNOSIS — Z791 Long term (current) use of non-steroidal anti-inflammatories (NSAID): Secondary | ICD-10-CM | POA: Insufficient documentation

## 2015-05-19 DIAGNOSIS — N401 Enlarged prostate with lower urinary tract symptoms: Secondary | ICD-10-CM | POA: Diagnosis not present

## 2015-05-19 DIAGNOSIS — R509 Fever, unspecified: Secondary | ICD-10-CM | POA: Diagnosis present

## 2015-05-19 DIAGNOSIS — M1990 Unspecified osteoarthritis, unspecified site: Secondary | ICD-10-CM | POA: Diagnosis not present

## 2015-05-19 DIAGNOSIS — M545 Low back pain: Secondary | ICD-10-CM | POA: Diagnosis not present

## 2015-05-19 DIAGNOSIS — I10 Essential (primary) hypertension: Secondary | ICD-10-CM | POA: Insufficient documentation

## 2015-05-19 DIAGNOSIS — Z79899 Other long term (current) drug therapy: Secondary | ICD-10-CM | POA: Insufficient documentation

## 2015-05-19 LAB — COMPREHENSIVE METABOLIC PANEL
ALK PHOS: 95 U/L (ref 38–126)
ALT: 54 U/L (ref 17–63)
AST: 64 U/L — AB (ref 15–41)
Albumin: 4.4 g/dL (ref 3.5–5.0)
Anion gap: 6 (ref 5–15)
BUN: 19 mg/dL (ref 6–20)
CALCIUM: 9.4 mg/dL (ref 8.9–10.3)
CHLORIDE: 105 mmol/L (ref 101–111)
CO2: 26 mmol/L (ref 22–32)
CREATININE: 1.21 mg/dL (ref 0.61–1.24)
GFR, EST NON AFRICAN AMERICAN: 58 mL/min — AB (ref >60)
Glucose, Bld: 90 mg/dL (ref 65–99)
Potassium: 3.8 mmol/L (ref 3.5–5.1)
Sodium: 137 mmol/L (ref 135–145)
Total Bilirubin: 1.5 mg/dL — ABNORMAL HIGH (ref 0.3–1.2)
Total Protein: 7.6 g/dL (ref 6.5–8.1)

## 2015-05-19 LAB — URINALYSIS COMPLETE WITH MICROSCOPIC (ARMC ONLY)
BILIRUBIN URINE: NEGATIVE
Glucose, UA: NEGATIVE mg/dL
Hgb urine dipstick: NEGATIVE
KETONES UR: NEGATIVE mg/dL
Leukocytes, UA: NEGATIVE
Nitrite: NEGATIVE
Protein, ur: NEGATIVE mg/dL
RBC / HPF: NONE SEEN RBC/hpf (ref 0–5)
Specific Gravity, Urine: 1.024 (ref 1.005–1.030)
pH: 5 (ref 5.0–8.0)

## 2015-05-19 LAB — CBC WITH DIFFERENTIAL/PLATELET
BASOS ABS: 0 10*3/uL (ref 0–0.1)
Basophils Relative: 0 %
Eosinophils Absolute: 0 10*3/uL (ref 0–0.7)
Eosinophils Relative: 0 %
HCT: 49 % (ref 40.0–52.0)
HEMOGLOBIN: 15.8 g/dL (ref 13.0–18.0)
LYMPHS ABS: 0.4 10*3/uL — AB (ref 1.0–3.6)
LYMPHS PCT: 2 %
MCH: 28.6 pg (ref 26.0–34.0)
MCHC: 32.2 g/dL (ref 32.0–36.0)
MCV: 88.9 fL (ref 80.0–100.0)
Monocytes Absolute: 0.8 10*3/uL (ref 0.2–1.0)
Monocytes Relative: 5 %
NEUTROS ABS: 15.6 10*3/uL — AB (ref 1.4–6.5)
NEUTROS PCT: 93 %
Platelets: 189 10*3/uL (ref 150–440)
RBC: 5.51 MIL/uL (ref 4.40–5.90)
RDW: 14.2 % (ref 11.5–14.5)
WBC: 16.8 10*3/uL — AB (ref 3.8–10.6)

## 2015-05-19 LAB — RAPID INFLUENZA A&B ANTIGENS (ARMC ONLY): INFLUENZA B (ARMC): NEGATIVE

## 2015-05-19 LAB — RAPID INFLUENZA A&B ANTIGENS: Influenza A (ARMC): NEGATIVE

## 2015-05-19 LAB — MONONUCLEOSIS SCREEN: Mono Screen: NEGATIVE

## 2015-05-19 MED ORDER — OXYCODONE-ACETAMINOPHEN 5-325 MG PO TABS
2.0000 | ORAL_TABLET | Freq: Once | ORAL | Status: AC
  Administered 2015-05-19: 2 via ORAL
  Filled 2015-05-19: qty 2

## 2015-05-19 MED ORDER — OXYCODONE-ACETAMINOPHEN 5-325 MG PO TABS: 1.0000 | ORAL_TABLET | Freq: Four times a day (QID) | ORAL | Status: DC | PRN

## 2015-05-19 NOTE — ED Provider Notes (Signed)
[facility] Emergency Department Provider Note  Time seen: 9:29 PM  I have reviewed the triage vital signs and the nursing notes.   HISTORY  Chief Complaint Abnormal Lab    HPI Jimmy Mcintyre is a 70 y.o. male with a past medical history of diabetes, prostate cancer status post radiation therapy which ended in January presents the emergency department with back pain and fever. According to the patient approximately 3 weeks ago he developed significant lower back pain. He states he's had lower back pain intermittently which has worsened from the radiation risk as it has always been mild to moderate however 3 weeks ago that became severe and he had a fever of 103. He stated this lasted for a day or 2 and then resolved. Patient states today he felt febrile once again, with back pain once again, so he went to urgent care. Patient was found have a white blood cell count of 16,000 so they sent him to the emergency department for further evaluation. Here the patient denies any recent cough, congestion, sore throat, headache, nausea, vomiting, diarrhea, abdominal pain, chest pain. Denies dysuria. Patient does state moderate lower back pain.     Past Medical History  Diagnosis Date  . Benign essential HTN 10/11/2014  . Arteriosclerosis of coronary artery 10/29/2014    Overview:  pci stent of lad   . Essential (primary) hypertension 10/29/2014  . TI (tricuspid incompetence) 04/29/2014  . Obstructive apnea 04/20/2014  . Arthritis, degenerative 09/30/2013    Overview:   a.  Shoulders severe.   b.  Cervical spine.   c.  Lumbar spine   . Diabetes mellitus type 2, uncontrolled ([place]) 11/16/2013  . Abnormal prostate specific antigen 11/16/2013  . 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 Breath shortness 09/30/2013  . Glaucoma   . Sleep apnea     Patient Active Problem List   Diagnosis Date Noted  . Arteriosclerosis of coronary artery 10/29/2014  . Essential  (primary) hypertension 10/29/2014  . 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 Hypogonadism in male 10/29/2014  . Enlarged prostate with lower urinary tract symptoms (LUTS) 10/29/2014  . Elevated PSA 10/29/2014  . Erectile dysfunction 10/29/2014  . Benign essential HTN 10/11/2014  . TI (tricuspid incompetence) 04/29/2014  . Obstructive apnea 04/20/2014  . Diabetes mellitus type 2, uncontrolled ([place]) 11/16/2013  . Abnormal prostate specific antigen 11/16/2013  . Type 2 diabetes mellitus ([place]) 11/16/2013  . Arthritis, degenerative 09/30/2013  . Breath shortness 09/30/2013    Past Surgical History  Procedure Laterality Date  . Coronary angioplasty with stent placement  2001  . Penile prosthesis implant  2000  . Shoulder surgery Right 2014    Current Outpatient Rx  Name  Route  Sig  Dispense  Refill  . amLODipine-benazepril (LOTREL) 5-20 MG per capsule      TAKE 1 CAPSULE BY MOUTH EVERY DAY         . aspirin 325 MG tablet   Oral   Take by mouth.         . cyanocobalamin (,VITAMIN B-12,) 1000 MCG/ML injection   Intramuscular   Inject into the muscle.         . Efinaconazole (JUBLIA) 10 % SOLN      APPLY TO NAILS EVERY DAY         . finasteride (PROSCAR) 5 MG tablet   Oral   Take by mouth.         . fluticasone (  FLONASE) 50 MCG/ACT nasal spray   Nasal   Place into the nose.         Marland Kitchen FLUZONE HIGH-DOSE 0.5 ML SUSY      Reported on 04/20/2015      0     Dispense as written.   Marland Kitchen gemfibrozil (LOPID) 600 MG tablet      TAKE 1 TABLET TWICE A DAY AS DIRECTED         . glimepiride (AMARYL) 2 MG tablet   Oral   Take by mouth.         Marland Kitchen HYDROcodone-acetaminophen (NORCO/VICODIN) 5-325 MG per tablet   Oral   Take by mouth.         . hydrocortisone (ANUSOL-HC) 2.5 % rectal cream   Rectal   Place 1 application rectally 2 (two) times daily.   30 g   2   . loratadine-pseudoephedrine (CLARITIN-D 12-HOUR) 5-120 MG per tablet    Oral   Take by mouth.         . metoprolol succinate (TOPROL-XL) 25 MG 24 hr tablet   Oral   Take by mouth.         . nabumetone (RELAFEN) 750 MG tablet      TAKE 1 TABLET TWICE A DAY AS NEEDED FOR PAIN         . omeprazole (PRILOSEC) 20 MG capsule   Oral   Take by mouth.         . EXPIRED: simvastatin (ZOCOR) 20 MG tablet   Oral   Take by mouth.         . Testosterone (ANDROGEL) 20.25 MG/1.25GM (1.62%) GEL   Topical   Apply topically.         Marland Kitchen zolpidem (AMBIEN CR) 12.5 MG CR tablet   Oral   Take by mouth.           Allergies Bactrim  Family History  Problem Relation Age of Onset  . Prostate cancer Neg Hx   . Bladder Cancer Neg Hx     Social History Social History  Substance Use Topics  . Smoking status: Never Smoker   . Smokeless tobacco: None  . Alcohol Use: 0.0 oz/week    0 Standard drinks or equivalent per week    Review of Systems Constitutional: Positive for fever Cardiovascular: Negative for chest pain. Respiratory: Negative for shortness of breath. Gastrointestinal: Negative for abdominal pain, vomiting and diarrhea. Genitourinary: Negative for dysuria. Musculoskeletal: Positive for lower back pain Neurological: Negative for headache 10-point ROS otherwise negative.  ____________________________________________   PHYSICAL EXAM:  VITAL SIGNS: ED Triage Vitals  Enc Vitals Group     BP 05/19/15 1902 137/75 mmHg     Pulse Rate 05/19/15 1902 92     Resp 05/19/15 1902 16     Temp 05/19/15 1902 100.1 F (37.8 C)     Temp Source 05/19/15 1902 Oral     SpO2 05/19/15 2125 99 %     Weight 05/19/15 1902 190 lb (86.183 kg)     Height 05/19/15 1902 5\' 9"  (1.753 m)     Head Cir --      Peak Flow --      Pain Score 05/19/15 1903 8     Pain Loc --      Pain Edu? --      Excl. in [place]? --     Constitutional: Alert and oriented. Well appearing and in no distress. Eyes: Normal exam ENT   Head: Normocephalic and  atraumatic.    Mouth/Throat: Mucous membranes are moist. Cardiovascular: Normal rate, regular rhythm. No murmur Respiratory: Normal respiratory effort without tachypnea nor retractions. Breath sounds are clear  Gastrointestinal: Soft and nontender. No distention.   Musculoskeletal: Nontender with normal range of motion in all extremities. No back tenderness to palpation. No neck tenderness. Neurologic:  Normal speech and language. No gross focal neurologic deficits Skin:  Skin is warm, dry and intact.  Psychiatric: Mood and affect are normal. Speech and behavior are normal.   ____________________________________________     RADIOLOGY  Chest x-ray negative  ____________________________________________   INITIAL IMPRESSION / ASSESSMENT AND PLAN / ED COURSE  Pertinent labs & imaging results that were available during my care of the patient were reviewed by me and considered in my medical decision making (see chart for details).  Patient presents the emergency department back pain and a fever. Patient denies nearly every other symptom on review of systems. It is not clear what is causing the patient's symptoms at this time. Patient's labs have resulted within elevated white blood cell count of 16,000. Currently awaiting urinalysis results as well as chest x-ray and influenza swab. Patient has no headache, no neck pain or stiffness. Do not suspect meningitis. However given the patient's back pain and fever if the rest of the workup is negative at believe the patient would likely require an MRI versus CT scan of the spine to rule out osteomyelitis or discitis. Patient has a history of a artificial shoulder which may limit our ability to do an MRI.  Patient's workup has been largely negative besides leukocytosis of 16,000. Monospot negative. Influenza negative. Chest x-ray negative. Labs are largely within normal limits besides leukocytosis. Given the patient's reported subjective fevers with elevated white  blood cell count and 8/10 lower back pain. I discussed with the patient that he needs an MRI. I called radiology who confirmed that the patient would be able to do an MRI even with his shoulder replacement.  Spoke to the patient about this, he is very upset that his been 6 hours since he arrived in the emergency department. He states he does not want an MRI. He wants to go home he will follow-up with his primary care doctor to schedule an MRI. I discussed with the patient at length my concern for possible discitis, osteomyelitis, or epidural abscess. Patient understands the risk in going home but states he does not want to wait any longer and wishes to be discharged. I discussed with the patient if he spikes a fever, has worsening pain or any numbness/weakness he is to return to the emergency department immediately for an MRI. I also discussed with the patient if he talks to his primary care doctor and it is going to be a considerable amount of time before is able to get an MRI he needs to return to the emergency department. Patient is agreeable to this plan, he has capacity to make his own decisions right now, he understands the risks she is taking by not getting the MRI tonight and he still wishes to go home.  ____________________________________________   FINAL CLINICAL IMPRESSION(S) / ED DIAGNOSES  Fever Back pain   Harvest Dark, MD 05/19/15 2254

## 2015-05-19 NOTE — ED Notes (Signed)
Discharge instructions reviewed with patient. Patient verbalized understanding. Patient ambulated to lobby without difficulty.   

## 2015-05-19 NOTE — Discharge Instructions (Signed)
You have been seen in the emergency Department for back pain and fever. Your workup that showed normal results besides an elevated white blood cell count. As we discussed I believe it is very important that you obtain an MRI as soon as possible. If you change your mind and would like to receive the MRI in the emergency department please return to the emergency department as soon as possible. If you develop fever, worsening pain please return to the emergency department immediately for MRI. Otherwise please call your primary care doctor tomorrow to schedule an MRI as soon as possible. Please see her primary care doctor soon as possible for recheck/reevaluation.   Back Pain, Adult Back pain is very common in adults.The cause of back pain is rarely dangerous and the pain often gets better over time.The cause of your back pain may not be known. Some common causes of back pain include:  Strain of the muscles or ligaments supporting the spine.  Wear and tear (degeneration) of the spinal disks.  Arthritis.  Direct injury to the back. For many people, back pain may return. Since back pain is rarely dangerous, most people can learn to manage this condition on their own. HOME CARE INSTRUCTIONS Watch your back pain for any changes. The following actions may help to lessen any discomfort you are feeling:  Remain active. It is stressful on your back to sit or stand in one place for long periods of time. Do not sit, drive, or stand in one place for more than 30 minutes at a time. Take short walks on even surfaces as soon as you are able.Try to increase the length of time you walk each day.  Exercise regularly as directed by your health care provider. Exercise helps your back heal faster. It also helps avoid future injury by keeping your muscles strong and flexible.  Do not stay in bed.Resting more than 1-2 days can delay your recovery.  Pay attention to your body when you bend and lift. The most  comfortable positions are those that put less stress on your recovering back. Always use proper lifting techniques, including:  Bending your knees.  Keeping the load close to your body.  Avoiding twisting.  Find a comfortable position to sleep. Use a firm mattress and lie on your side with your knees slightly bent. If you lie on your back, put a pillow under your knees.  Avoid feeling anxious or stressed.Stress increases muscle tension and can worsen back pain.It is important to recognize when you are anxious or stressed and learn ways to manage it, such as with exercise.  Take medicines only as directed by your health care provider. Over-the-counter medicines to reduce pain and inflammation are often the most helpful.Your health care provider may prescribe muscle relaxant drugs.These medicines help dull your pain so you can more quickly return to your normal activities and healthy exercise.  Apply ice to the injured area:  Put ice in a plastic bag.  Place a towel between your skin and the bag.  Leave the ice on for 20 minutes, 2-3 times a day for the first 2-3 days. After that, ice and heat may be alternated to reduce pain and spasms.  Maintain a healthy weight. Excess weight puts extra stress on your back and makes it difficult to maintain good posture. SEEK MEDICAL CARE IF:  You have pain that is not relieved with rest or medicine.  You have increasing pain going down into the legs or buttocks.  You have  pain that does not improve in one week.  You have night pain.  You lose weight.  You have a fever or chills. SEEK IMMEDIATE MEDICAL CARE IF:   You develop new bowel or bladder control problems.  You have unusual weakness or numbness in your arms or legs.  You develop nausea or vomiting.  You develop abdominal pain.  You feel faint.   This information is not intended to replace advice given to you by your health care provider. Make sure you discuss any questions  you have with your health care provider.   Document Released: 02/19/2005 Document Revised: 03/12/2014 Document Reviewed: 06/23/2013 Elsevier Interactive Patient Education 2016 [place].  Fever, Adult A fever is an increase in the body's temperature. It is usually defined as a temperature of 100F (38C) or higher. Brief mild or moderate fevers generally have no long-term effects, and they often do not require treatment. Moderate or high fevers may make you feel uncomfortable and can sometimes be a sign of a serious illness or disease. The sweating that may occur with repeated or prolonged fever may also cause dehydration. Fever is confirmed by taking a temperature with a thermometer. A measured temperature can vary with:  Age.  Time of day.  Location of the thermometer:  Mouth (oral).  Rectum (rectal).  Ear (tympanic).  Underarm (axillary).  Forehead (temporal). HOME CARE INSTRUCTIONS Pay attention to any changes in your symptoms. Take these actions to help with your condition:  Take over-the counter and prescription medicines only as told by your health care provider. Follow the dosing instructions carefully.  If you were prescribed an antibiotic medicine, take it as told by your health care provider. Do not stop taking the antibiotic even if you start to feel better.  Rest as needed.  Drink enough fluid to keep your urine clear or pale yellow. This helps to prevent dehydration.  Sponge yourself or bathe with room-temperature water to help reduce your body temperature as needed. Do not use ice water.  Do not overbundle yourself in blankets or heavy clothes. SEEK MEDICAL CARE IF:  You vomit.  You cannot eat or drink without vomiting.  You have diarrhea.  You have pain when you urinate.  Your symptoms do not improve with treatment.  You develop new symptoms.  You develop excessive weakness. SEEK IMMEDIATE MEDICAL CARE IF:  You have shortness of breath or  have trouble breathing.  You are dizzy or you faint.  You are disoriented or confused.  You develop signs of dehydration, such as a dry mouth, decreased urination, or paleness.  You develop severe pain in your abdomen.  You have persistent vomiting or diarrhea.  You develop a skin rash.  Your symptoms suddenly get worse.   This information is not intended to replace advice given to you by your health care provider. Make sure you discuss any questions you have with your health care provider.   Document Released: 08/15/2000 Document Revised: 11/10/2014 Document Reviewed: 04/15/2014 Elsevier Interactive Patient Education [employer].

## 2015-05-19 NOTE — ED Notes (Signed)
Pt sent here from [facility] with high WBC count (16,000). Pt reports fever x3 weeks. Pt denies abdominal pain, chest pain, shortness of breath or cough. Pt reports generalized joint pain.

## 2015-05-20 ENCOUNTER — Ambulatory Visit
Admission: RE | Admit: 2015-05-20 | Discharge: 2015-05-20 | Disposition: A | Discharge status: Home / Self Care | Payer: Medicare Other | Source: Ambulatory Visit | Attending: Internal Medicine | Admitting: Internal Medicine

## 2015-05-20 ENCOUNTER — Other Ambulatory Visit: Payer: Self-pay | Admitting: Internal Medicine

## 2015-05-20 DIAGNOSIS — M5136 Other intervertebral disc degeneration, lumbar region: Secondary | ICD-10-CM | POA: Diagnosis not present

## 2015-05-20 DIAGNOSIS — D72829 Elevated white blood cell count, unspecified: Secondary | ICD-10-CM | POA: Diagnosis present

## 2015-05-20 DIAGNOSIS — M545 Low back pain, unspecified: Secondary | ICD-10-CM

## 2015-05-20 DIAGNOSIS — M549 Dorsalgia, unspecified: Secondary | ICD-10-CM

## 2015-05-20 DIAGNOSIS — D72828 Other elevated white blood cell count: Secondary | ICD-10-CM

## 2015-05-20 DIAGNOSIS — C61 Malignant neoplasm of prostate: Secondary | ICD-10-CM

## 2015-05-20 HISTORY — DX: Malignant (primary) neoplasm, unspecified: C80.1

## 2015-05-20 MED ORDER — IOHEXOL 300 MG/ML  SOLN
100.0000 mL | Freq: Once | INTRAMUSCULAR | Status: AC | PRN
  Administered 2015-05-20: 100 mL via INTRAVENOUS

## 2015-08-29 ENCOUNTER — Inpatient Hospital Stay: Admission: RE | Admit: 2015-08-29 | Payer: Medicare Other | Source: Ambulatory Visit | Admitting: Radiation Oncology

## 2015-08-29 ENCOUNTER — Ambulatory Visit: Payer: Medicare Other | Admitting: Radiation Oncology

## 2015-08-31 ENCOUNTER — Inpatient Hospital Stay: Payer: Medicare Other | Attending: Radiation Oncology

## 2015-08-31 ENCOUNTER — Other Ambulatory Visit: Payer: Self-pay | Admitting: *Deleted

## 2015-08-31 ENCOUNTER — Ambulatory Visit
Admission: RE | Admit: 2015-08-31 | Discharge: 2015-08-31 | Disposition: A | Discharge status: Home / Self Care | Payer: Medicare Other | Source: Ambulatory Visit | Attending: Radiation Oncology | Admitting: Radiation Oncology

## 2015-08-31 ENCOUNTER — Encounter: Payer: Self-pay | Admitting: Radiation Oncology

## 2015-08-31 ENCOUNTER — Encounter (INDEPENDENT_AMBULATORY_CARE_PROVIDER_SITE_OTHER): Payer: Self-pay

## 2015-08-31 VITALS — BP 175/91 | HR 62 | Temp 97.1°F | Resp 18 | Wt 185.6 lb

## 2015-08-31 DIAGNOSIS — Z923 Personal history of irradiation: Secondary | ICD-10-CM | POA: Diagnosis not present

## 2015-08-31 DIAGNOSIS — C61 Malignant neoplasm of prostate: Secondary | ICD-10-CM

## 2015-08-31 LAB — PSA: PSA: 0.56 ng/mL (ref 0.00–4.00)

## 2015-08-31 NOTE — Progress Notes (Signed)
.  Radiation Oncology Follow up Note  Name: Jimmy Mcintyre   Date:   08/31/2015 MRN:  [id number] DOB: [birth year]    This 70 y.o. male presents to the clinic today for six-month follow-up for prostate cancer status post I MRT.  REFERRING PROVIDER: Collier Flowers, MD  HPI: Patient is a 70 year old male now out 6 months having completed IM RT image guided radiation therapy for stage II (T1 CN 0 M0) Gleason 7 (3+4) adenocarcinoma presenting the PSA of 7.7. He is seen today in routine follow-up doing fairly well he's had numerous episodes of spiking fevers chills shakes and a high white count. He's been evaluated multiple times in the emergency room with nois established. Back in December he did have a CT scan of abdomen and pelvis showing increased caliber of the common bile duct measuring 1 cm recognition for MRCP was made. That was never performed. His last episode was about a month and half prior. He specifically denies increased lower urinary tract symptoms or diarrhea.  COMPLICATIONS OF TREATMENT: none  FOLLOW UP COMPLIANCE: keeps appointments   PHYSICAL EXAM:  BP 175/91 mmHg  Pulse 62  Temp(Src) 97.1 F (36.2 C)  Resp 18  Wt 185 lb 10 oz (84.2 kg) On rectal exam rectal sphincter tone is good. Prostate is smooth contracted without evidence of nodularity or mass. Sulcus is preserved bilaterally. No discrete nodularity is identified. No other rectal abnormalities are noted. Well-developed well-nourished patient in NAD. HEENT reveals PERLA, EOMI, discs not visualized.  Oral cavity is clear. No oral mucosal lesions are identified. Neck is clear without evidence of cervical or supraclavicular adenopathy. Lungs are clear to A&P. Cardiac examination is essentially unremarkable with regular rate and rhythm without murmur rub or thrill. Abdomen is benign with no organomegaly or masses noted. Motor sensory and DTR levels are equal and symmetric in the upper and lower extremities. Cranial nerves II through  XII are grossly intact. Proprioception is intact. No peripheral adenopathy or edema is identified. No motor or sensory levels are noted. Crude visual fields are within normal range.  RADIOLOGY RESULTS: CT scans of abdomen and pelvis and chest all reviewed  PLAN: At this time from a prostate Cancer standpoint he is doing well. I'm please was overall progress. I've reviewed his CT scan with him and made recommendations possibly ordering an MRCP. I'll leave that to his PMD Dr. Doy Hutching. Otherwise I have run a PSA level on today on him today and will report that separately. I've asked to see him back in 6 months for follow-up. Patient knows to call sooner with any concerns.  I would like to take this opportunity to thank you for allowing me to participate in the care of your patient.Armstead Peaks., MD

## 2015-09-16 NOTE — Telephone Encounter (Signed)
x

## 2015-11-21 ENCOUNTER — Ambulatory Visit (INDEPENDENT_AMBULATORY_CARE_PROVIDER_SITE_OTHER): Payer: Medicare Other | Admitting: Urology

## 2015-11-21 ENCOUNTER — Encounter: Payer: Self-pay | Admitting: Urology

## 2015-11-21 VITALS — Ht 69.0 in | Wt 186.0 lb

## 2015-11-21 DIAGNOSIS — E291 Testicular hypofunction: Secondary | ICD-10-CM

## 2015-11-21 DIAGNOSIS — N401 Enlarged prostate with lower urinary tract symptoms: Secondary | ICD-10-CM

## 2015-11-21 DIAGNOSIS — C61 Malignant neoplasm of prostate: Secondary | ICD-10-CM

## 2015-11-21 LAB — BLADDER SCAN AMB NON-IMAGING

## 2015-11-21 MED ORDER — OXYBUTYNIN CHLORIDE 5 MG PO TABS: 5.0000 mg | ORAL_TABLET | Freq: Three times a day (TID) | ORAL | 11 refills | Status: DC | PRN

## 2015-11-21 NOTE — Progress Notes (Signed)
10/29/2014 4:22 PM   Jimmy Mcintyre January 24, 1946 [id number]  Referring provider: No referring provider defined for this encounter.  Chief Complaint  Patient presents with  . 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 TRUS 40mL. Underwent primary XRT. Under care of [facility].  Post XRT Course: 08/2015 PSA 0.56  2 - Hypogonadism  - On androgel x years for symptomatic hypogonadism, mostly from extreme fatigue. Denies h/o polycythemia. Now ON HOLD given large volume prostat cancer.   3 - Erectile Dysfunction - s/p IPP implant and revision x2, most recently in [place] around 2010. Now with severe SST deformity and he is very reluctant to use.  4 - Lower Urinary Tract Symptoms / Urinary Urgency- on finasteride since around 2000 for enlarged prostate with obstructive sympotms, now s/p external beam radiation and with increased irritative sympotms and urge leakage. Now manaigng with thin pad. PVR 11/2015 71mL (normal).  PMH sig for CAD/Stent (now not limiting whatsoever), Dm2, OA, GERD. His PCP is Jimmy Chard MD with [name].   Today "Jimmy Mcintyre" is seen in f/u above. His most recent PSA shows good response to radiation. No interval retention or gross hematuria.    PMH: Past Medical History  Diagnosis Date  . Benign essential HTN 10/11/2014  . Arteriosclerosis of coronary artery 10/29/2014    Overview:  pci stent of lad   . Essential (primary) hypertension 10/29/2014  . TI (tricuspid incompetence) 04/29/2014  . Obstructive apnea 04/20/2014  . Arthritis, degenerative 09/30/2013    Overview:   a.  Shoulders severe.   b.  Cervical spine.   c.  Lumbar spine   . Diabetes mellitus type 2, uncontrolled 11/16/2013  . Abnormal prostate specific antigen 11/16/2013  . 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 Breath shortness 09/30/2013  . Glaucoma   . Sleep apnea       Surgical History: Past Surgical History  Procedure Laterality Date  . Coronary angioplasty with stent placement  2001  . Penile prosthesis implant  2000  . Shoulder surgery Right 2014    Home Medications:    Medication List       This list is accurate as of: 10/29/14  4:22 PM.  Always use your most recent med list.               amLODipine-benazepril 5-20 MG per capsule  Commonly known as:  LOTREL  TAKE 1 CAPSULE BY MOUTH EVERY DAY     ANDROGEL 20.25 MG/1.25GM (1.62%) Gel  Generic drug:  Testosterone  Apply topically.     aspirin 325 MG tablet  Take by mouth.     cyanocobalamin 1000 MCG/ML injection  Commonly known as:  (VITAMIN B-12)  Inject into the muscle.     finasteride 5 MG tablet  Commonly known as:  PROSCAR  Take by mouth.     fluticasone 50 MCG/ACT nasal spray  Commonly known as:  FLONASE  Place into the nose.     gemfibrozil 600 MG tablet  Commonly known as:  LOPID  TAKE 1 TABLET TWICE A DAY AS DIRECTED     glimepiride 2 MG tablet  Commonly known as:  AMARYL  Take by mouth.     HYDROcodone-acetaminophen 5-325 MG per tablet  Commonly known as:  NORCO/VICODIN  Take by mouth.     JUBLIA 10 %  Soln  Generic drug:  Efinaconazole  APPLY TO NAILS EVERY DAY     loratadine-pseudoephedrine 5-120 MG per tablet  Commonly known as:  CLARITIN-D 12-hour  Take by mouth.     metoprolol succinate 25 MG 24 hr tablet  Commonly known as:  TOPROL-XL  Take by mouth.     nabumetone 750 MG tablet  Commonly known as:  RELAFEN  TAKE 1 TABLET TWICE A DAY AS NEEDED FOR PAIN     omeprazole 20 MG capsule  Commonly known as:  PRILOSEC  Take by mouth.     simvastatin 20 MG tablet  Commonly known as:  ZOCOR  Take by mouth.     sulfamethoxazole-trimethoprim 800-160 MG per tablet  Commonly known as:  BACTRIM DS,SEPTRA DS  Take 1 tablet by mouth every 12 (twelve) hours. Begin 3 days before prostate biopsy.     zolpidem 12.5 MG CR tablet  Commonly known as:   AMBIEN CR  Take by mouth.        Allergies: No Known Allergies  Family History: Family History  Problem Relation Age of Onset  . Prostate cancer Neg Hx   . Bladder Cancer Neg Hx     Social History:  reports that he has never smoked. He does not have any smokeless tobacco history on file. He reports that he drinks alcohol. He reports that he does not use illicit drugs.  ROS: UROLOGY Frequent Urination?: Yes Hard to postpone urination?: Yes Burning/pain with urination?: No Get up at night to urinate?: Yes Leakage of urine?: Yes Urine stream starts and stops?: No Trouble starting stream?: Yes Do you have to strain to urinate?: No Blood in urine?: No Urinary tract infection?: No Sexually transmitted disease?: No Injury to kidneys or bladder?: No Painful intercourse?: No Weak stream?: Yes Erection problems?: Yes Penile pain?: Yes  Gastrointestinal Nausea?: No Vomiting?: No Indigestion/heartburn?: No Diarrhea?: No Constipation?: No  Constitutional Fever: No Night sweats?: No Weight loss?: No Fatigue?: No  Skin Skin rash/lesions?: No Itching?: No  Eyes Blurred vision?: Yes Double vision?: No  Ears/Nose/Throat Sore throat?: No Sinus problems?: Yes  Hematologic/Lymphatic Swollen glands?: No Easy bruising?: No  Cardiovascular Leg swelling?: No Chest pain?: No  Respiratory Cough?: No Shortness of breath?: Yes  Endocrine Excessive thirst?: No  Musculoskeletal Back pain?: Yes Joint pain?: Yes  Neurological Headaches?: No Dizziness?: No  Psychologic Depression?: No Anxiety?: No  Physical Exam: BP 168/91 mmHg  Pulse 64  Ht 5\' 9"  (1.753 m)  Wt 184 lb 4.8 oz (83.598 kg)  BMI 27.20 kg/m2  Constitutional:  Alert and oriented, No acute distress. HEENT: [place] AT, moist mucus membranes.  Trachea midline, no masses. Cardiovascular: No clubbing, cyanosis, or edema. Respiratory: Normal respiratory effort, no increased work of breathing. GI:  Abdomen is soft, nontender, nondistended, no abdominal masses GU: No CVA tenderness. IPP in place w/o erosion, pump in Rt hemiscrotum. DRE 40gm scarrified fossa w/o masses.  Skin: No rashes, bruises or suspicious lesions. Lymph: No cervical or inguinal adenopathy. Neurologic: Grossly intact, no focal deficits, moving all 4 extremities. Psychiatric: Normal mood and affect.  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 He has been getting PSA's at least Q6 mo through cancer center. Reinforced need for at least annual surveillance until 2026.   2 - Hypogonadism  -  STRONGLY recommended no exogenous androgens given known prostate malignancy.   3 - Erectile Dysfunction - has funcitoning IPP, just with some glans hypermobility which he is not interested in adressing at present. Marland Kitchen  4 - Lower Urinary Tract Symptoms / Urinary Urgency- likely radiation irritation of bladder causing OAB wet. Continue finasteride but also add oxybtynin 5mg  Q8 prn. Warned abtou dry mouth / constipation. Should this fail to achive his goals, may try myrbetriq, or then even cysto / UDS given history.  5 - RTC  6 weeks  Alexis Frock, MD  [facility] [address] [place], [place] [address] [phone]

## 2015-12-26 ENCOUNTER — Ambulatory Visit: Payer: Medicare Other

## 2015-12-27 ENCOUNTER — Ambulatory Visit (INDEPENDENT_AMBULATORY_CARE_PROVIDER_SITE_OTHER): Payer: Medicare Other | Admitting: Urology

## 2015-12-27 ENCOUNTER — Encounter: Payer: Self-pay | Admitting: Urology

## 2015-12-27 VITALS — BP 127/81 | HR 60 | Ht 69.0 in | Wt 188.4 lb

## 2015-12-27 DIAGNOSIS — N3281 Overactive bladder: Secondary | ICD-10-CM | POA: Diagnosis not present

## 2015-12-27 DIAGNOSIS — N401 Enlarged prostate with lower urinary tract symptoms: Secondary | ICD-10-CM | POA: Diagnosis not present

## 2015-12-27 DIAGNOSIS — C61 Malignant neoplasm of prostate: Secondary | ICD-10-CM | POA: Diagnosis not present

## 2015-12-27 MED ORDER — ALFUZOSIN HCL ER 10 MG PO TB24: 10.0000 mg | ORAL_TABLET | Freq: Every day | ORAL | 11 refills | Status: DC

## 2015-12-27 NOTE — Progress Notes (Signed)
12/27/2015 2:32 PM   [name] Dec 14, 1945 [id number]  Referring provider: Idelle Crouch, MD [employer] [facility] [place], [place] [id number]  Chief Complaint  Patient presents with  . Follow-up    Prostate Cancer     HPI: Mr Parello is a 70yo seen in followup for BPH with LUTS with urgency and OAB. He is on finasteride for LUTS. Since his last visit he has worsening LUTS with hesitancy, nocturia 1-2x, urgency with urge incontinence. He has a weak stream.  He was prescribed oxybutynin at his last visit which caused dry eye and constipation. He stopped it after 2 weeks. He has a hx of glaucoma.  He has erectile dysfunction and a IPP s/p 2 revisions.     PMH: Past Medical History:  Diagnosis Date  . Abnormal prostate specific antigen 11/16/2013  . Arteriosclerosis of coronary artery 10/29/2014   Overview:  pci stent of lad   . Arthritis, degenerative 09/30/2013   Overview:   a.  Shoulders severe.   b.  Cervical spine.   c.  Lumbar spine   . Benign essential HTN 10/11/2014  . Breath shortness 09/30/2013  . Cancer ([place])   . 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 Diabetes mellitus type 2, uncontrolled ([place]) 11/16/2013  . Essential (primary) hypertension 10/29/2014  . Glaucoma   . Obstructive apnea 04/20/2014  . Sleep apnea   . TI (tricuspid incompetence) 04/29/2014    Surgical History: Past Surgical History:  Procedure Laterality Date  . CORONARY ANGIOPLASTY WITH STENT PLACEMENT  2001  . PENILE PROSTHESIS IMPLANT  2000  . SHOULDER SURGERY Right 2014    Home Medications:    Medication List       Accurate as of 12/27/15  2:32 PM. Always use your most recent med list.          amLODipine-benazepril 5-20 MG capsule Commonly known as:  LOTREL TAKE 1 CAPSULE BY MOUTH EVERY DAY   ANDROGEL 20.25 MG/1.25GM (1.62%) Gel Generic drug:  Testosterone Apply topically.   aspirin 325 MG tablet Take by mouth.   cyanocobalamin 1000  MCG/ML injection Commonly known as:  (VITAMIN B-12) Inject into the muscle.   finasteride 5 MG tablet Commonly known as:  PROSCAR Take by mouth.   gemfibrozil 600 MG tablet Commonly known as:  LOPID TAKE 1 TABLET TWICE A DAY AS DIRECTED   glimepiride 2 MG tablet Commonly known as:  AMARYL   metoprolol succinate 25 MG 24 hr tablet Commonly known as:  TOPROL-XL   nabumetone 750 MG tablet Commonly known as:  RELAFEN TAKE 1 TABLET TWICE A DAY AS NEEDED FOR PAIN   omeprazole 20 MG capsule Commonly known as:  PRILOSEC Take by mouth.   oxybutynin 5 MG tablet Commonly known as:  DITROPAN Take 1 tablet (5 mg total) by mouth every 8 (eight) hours as needed for bladder spasms.   simvastatin 20 MG tablet Commonly known as:  ZOCOR Take by mouth.   traMADol 50 MG tablet Commonly known as:  ULTRAM   zolpidem 12.5 MG CR tablet Commonly known as:  AMBIEN CR Take by mouth.       Allergies:  Allergies  Allergen Reactions  . Bactrim [Sulfamethoxazole-Trimethoprim] Rash    Family History: Family History  Problem Relation Age of Onset  . Prostate cancer Neg Hx   . Bladder Cancer Neg Hx     Social History:  reports that he has never smoked. He has never used smokeless  tobacco. He reports that he drinks alcohol. He reports that he does not use drugs.  ROS:                                        Physical Exam: There were no vitals taken for this visit.  Constitutional:  Alert and oriented, No acute distress. HEENT: [place] AT, moist mucus membranes.  Trachea midline, no masses. Cardiovascular: No clubbing, cyanosis, or edema. Respiratory: Normal respiratory effort, no increased work of breathing. GI: Abdomen is soft, nontender, nondistended, no abdominal masses GU: No CVA tenderness.  Skin: No rashes, bruises or suspicious lesions. Lymph: No cervical or inguinal adenopathy. Neurologic: Grossly intact, no focal deficits, moving all 4  extremities. Psychiatric: Normal mood and affect.  Laboratory Data: Lab Results  Component Value Date   WBC 16.8 (H) 05/19/2015   HGB 15.8 05/19/2015   HCT 49.0 05/19/2015   MCV 88.9 05/19/2015   PLT 189 05/19/2015    Lab Results  Component Value Date   CREATININE 1.21 05/19/2015    Lab Results  Component Value Date   PSA 0.56 08/31/2015   PSA 3.2 04/30/2012   PSA 6.0 (H) 09/19/2011    No results found for: TESTOSTERONE  No results found for: HGBA1C  Urinalysis    Component Value Date/Time   COLORURINE YELLOW (A) 05/19/2015 2122   APPEARANCEUR CLEAR (A) 05/19/2015 2122   APPEARANCEUR Clear 10/29/2014 1551   LABSPEC 1.024 05/19/2015 2122   PHURINE 5.0 05/19/2015 2122   GLUCOSEU NEGATIVE 05/19/2015 2122   HGBUR NEGATIVE 05/19/2015 2122   BILIRUBINUR NEGATIVE 05/19/2015 2122   BILIRUBINUR Negative 10/29/2014 1551   [place] 05/19/2015 2122   PROTEINUR NEGATIVE 05/19/2015 2122   NITRITE NEGATIVE 05/19/2015 2122   LEUKOCYTESUR NEGATIVE 05/19/2015 2122   LEUKOCYTESUR Negative 10/29/2014 1551    Pertinent Imaging: Pt refused post void residual  Assessment & Plan:    1. BPH with LUTS -uroxatral 10mg  daily -copntinue finasteride -RTC 4-6 weeks  There are no diagnoses linked to this encounter.  No Follow-up on file.  Nicolette Bang, MD  [facility] [address] [place], [place] [address] [phone]

## 2015-12-28 LAB — URINALYSIS, COMPLETE
BILIRUBIN UA: NEGATIVE
GLUCOSE, UA: NEGATIVE
Ketones, UA: NEGATIVE
Leukocytes, UA: NEGATIVE
NITRITE UA: NEGATIVE
PH UA: 5.5 (ref 5.0–7.5)
Protein, UA: NEGATIVE
RBC UA: NEGATIVE
Specific Gravity, UA: 1.025 (ref 1.005–1.030)
UUROB: 0.2 mg/dL (ref 0.2–1.0)

## 2015-12-28 LAB — MICROSCOPIC EXAMINATION
Bacteria, UA: NONE SEEN
RBC MICROSCOPIC, UA: NONE SEEN /HPF (ref 0–?)

## 2016-02-07 ENCOUNTER — Ambulatory Visit: Payer: Medicare Other

## 2016-02-28 ENCOUNTER — Other Ambulatory Visit: Payer: Self-pay | Admitting: *Deleted

## 2016-02-28 DIAGNOSIS — C61 Malignant neoplasm of prostate: Secondary | ICD-10-CM

## 2016-02-29 ENCOUNTER — Other Ambulatory Visit: Payer: Self-pay | Admitting: *Deleted

## 2016-03-01 ENCOUNTER — Ambulatory Visit: Payer: Medicare Other | Admitting: Radiation Oncology

## 2016-03-01 ENCOUNTER — Inpatient Hospital Stay: Payer: Medicare Other

## 2016-03-15 ENCOUNTER — Other Ambulatory Visit: Payer: Self-pay | Admitting: *Deleted

## 2016-03-15 ENCOUNTER — Inpatient Hospital Stay: Payer: Medicare Other | Attending: Radiation Oncology

## 2016-03-15 ENCOUNTER — Encounter: Payer: Self-pay | Admitting: Radiation Oncology

## 2016-03-15 ENCOUNTER — Ambulatory Visit
Admission: RE | Admit: 2016-03-15 | Discharge: 2016-03-15 | Disposition: A | Discharge status: Home / Self Care | Payer: Medicare Other | Source: Ambulatory Visit | Attending: Radiation Oncology | Admitting: Radiation Oncology

## 2016-03-15 VITALS — BP 130/76 | HR 63 | Temp 98.1°F | Wt 189.5 lb

## 2016-03-15 DIAGNOSIS — C61 Malignant neoplasm of prostate: Secondary | ICD-10-CM | POA: Diagnosis not present

## 2016-03-15 DIAGNOSIS — Z923 Personal history of irradiation: Secondary | ICD-10-CM | POA: Insufficient documentation

## 2016-03-15 LAB — PSA: PSA: 0.3 ng/mL (ref 0.00–4.00)

## 2016-03-15 NOTE — Progress Notes (Signed)
Radiation Oncology Follow up Note  Name: Jimmy Mcintyre   Date:   03/15/2016 MRN:  [id number] DOB: [birth year]    This 71 y.o. male presents to the clinic today for 1 year follow-up status post I MRT radiation therapy for stage II adenocarcinoma the prostate.  REFERRING PROVIDER: Collier Flowers, MD  HPI: Patient is a 71 year old male now out 1 meet year having completed IM RT radiation therapy image guided for stage II (T1 CN 0 M0) Gleason 7 (3+4) adenocarcinoma presenting the PSA of 7.7. He is seen today in routine follow-up and is doing well specifically denies diarrhea dysuria or any other GI/GU complaints. His last PSA was. Back in June and was 0.56. PSA was repeated today  COMPLICATIONS OF TREATMENT: none  FOLLOW UP COMPLIANCE: keeps appointments   PHYSICAL EXAM:  BP 130/76   Pulse 63   Temp 98.1 F (36.7 C)   Wt 189 lb 7.8 oz (86 kg)   BMI 27.98 kg/m  On rectal exam rectal sphincter tone is good. Prostate is smooth contracted without evidence of nodularity or mass. Sulcus is preserved bilaterally. No discrete nodularity is identified. No other rectal abnormalities are noted. Well-developed well-nourished patient in NAD. HEENT reveals PERLA, EOMI, discs not visualized.  Oral cavity is clear. No oral mucosal lesions are identified. Neck is clear without evidence of cervical or supraclavicular adenopathy. Lungs are clear to A&P. Cardiac examination is essentially unremarkable with regular rate and rhythm without murmur rub or thrill. Abdomen is benign with no organomegaly or masses noted. Motor sensory and DTR levels are equal and symmetric in the upper and lower extremities. Cranial nerves II through XII are grossly intact. Proprioception is intact. No peripheral adenopathy or edema is identified. No motor or sensory levels are noted. Crude visual fields are within normal range.  RADIOLOGY RESULTS: No current films for review  PLAN: I have run a PSA level on him today and will report  that separately. Otherwise I'm please was overall progress. I've asked to see him back in 1 year for follow-up. He knows to call sooner with any concerns.  I would like to take this opportunity to thank you for allowing me to participate in the care of your patient.Armstead Peaks., MD

## 2016-08-31 IMAGING — CT CT ABDOMEN AND PELVIS WITHOUT AND WITH CONTRAST
2 of 9 series · 12 of 46 positions shown, 14 images · IV contrast (omnipaque)
Comparison: 06/19/2010

CLINICAL DATA: Weight loss for 2 months.

EXAM:
CT ABDOMEN AND PELVIS WITHOUT AND WITH CONTRAST
TECHNIQUE: Multidetector CT imaging of the abdomen and pelvis was performed
following the standard protocol before and following the bolus
administration of intravenous contrast.
CONTRAST:  100 cc of Omnipaque 300

[Series 2: routine abd pel wo · axial · 0.84mm/px · z∈[-1100,-696]mm · 9 of 103 slices shown, 11 images]
[im 11/103  soft-tissue]
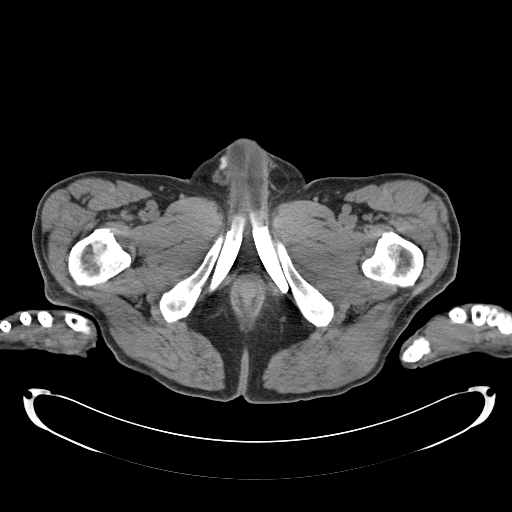
[im 11/103  bone]
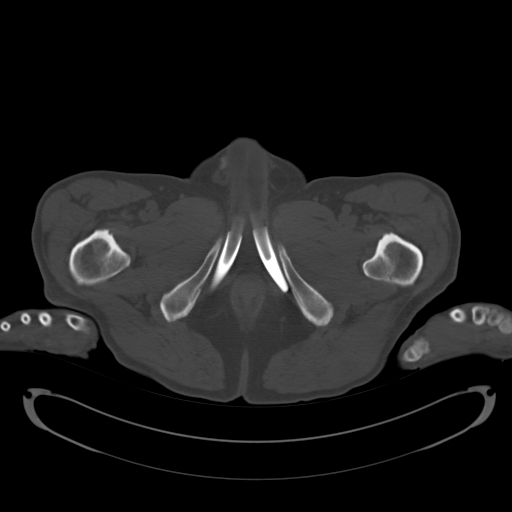
[im 21/103  soft-tissue]
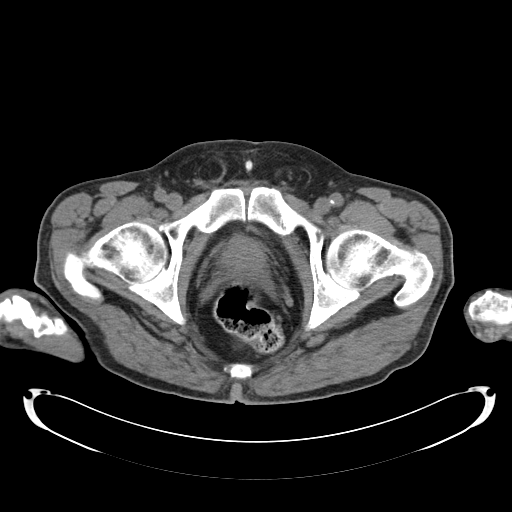
[im 31/103  soft-tissue]
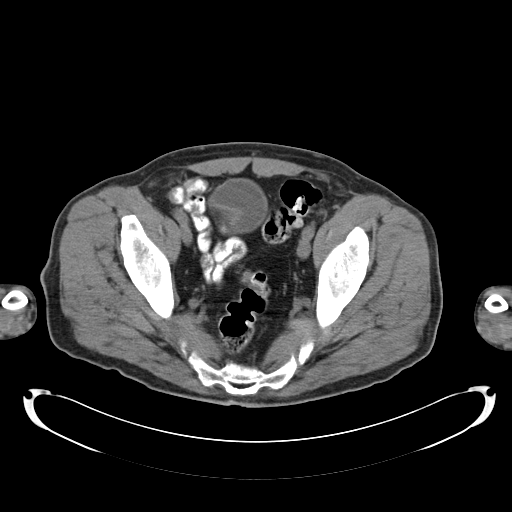
[im 41/103  soft-tissue]
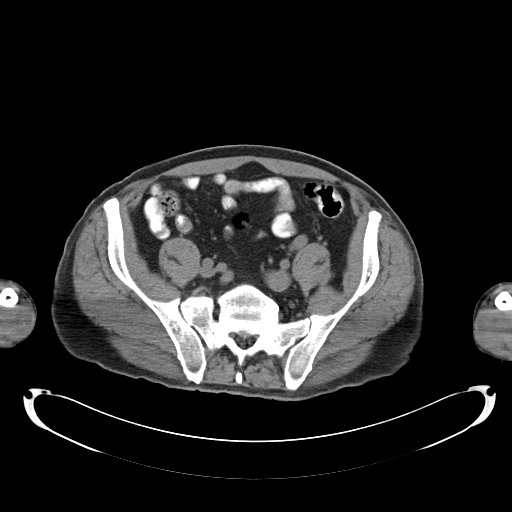
[im 52/103  soft-tissue]
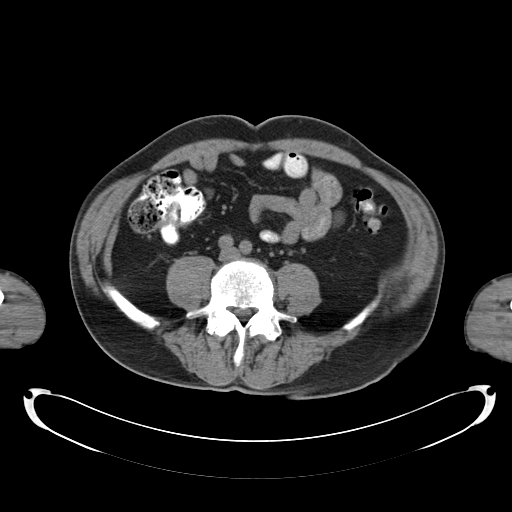
[im 62/103  soft-tissue]
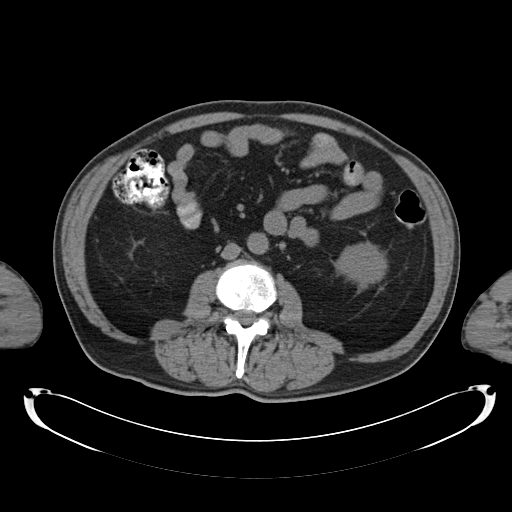
[im 72/103  soft-tissue]
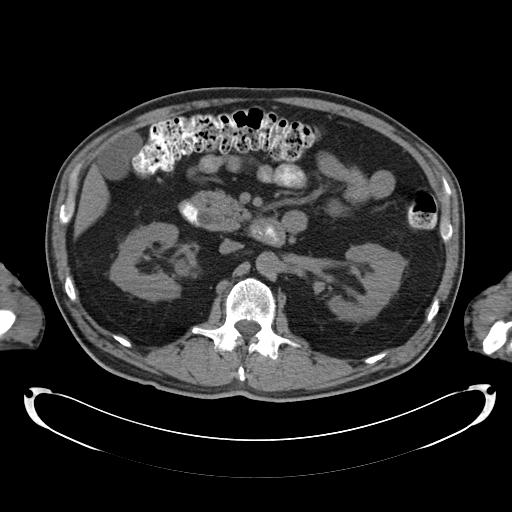
[im 82/103  soft-tissue]
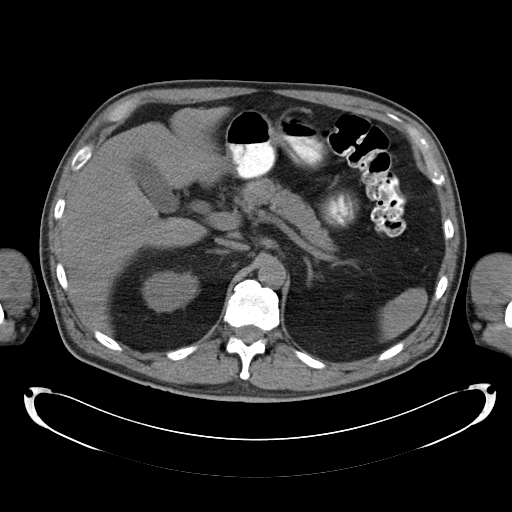
[im 92/103  soft-tissue]
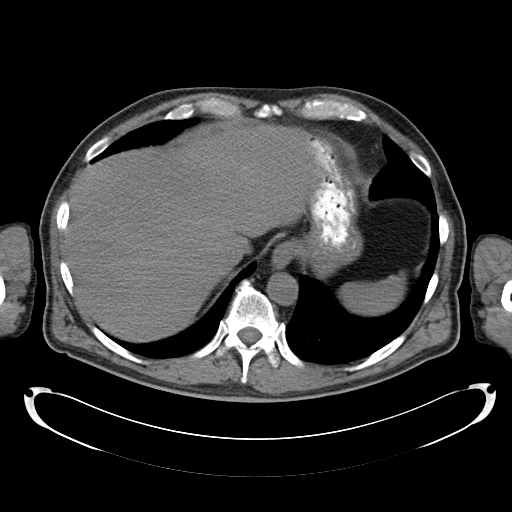
[im 92/103  bone]
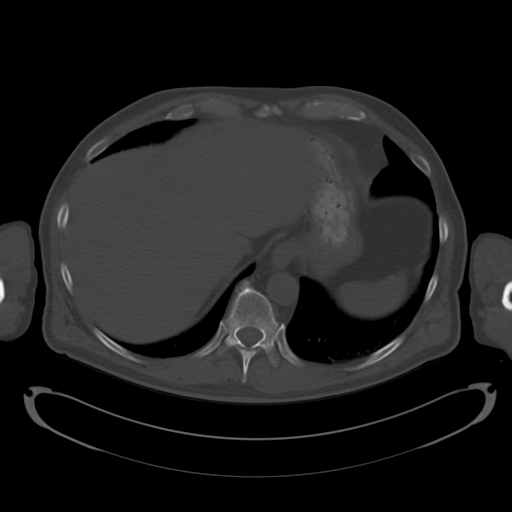

[Series 5: cor routine abd pel wo · coronal · 0.69mm/px · 3 of 142 slices shown]
[im 36/142  soft-tissue]
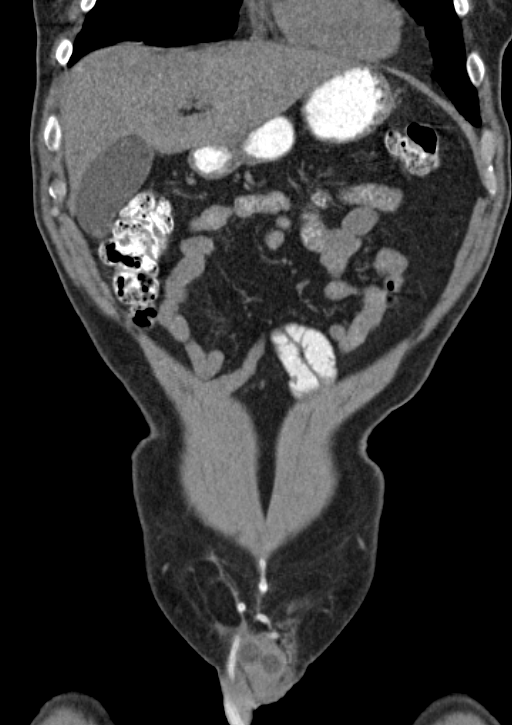
[im 71/142  soft-tissue]
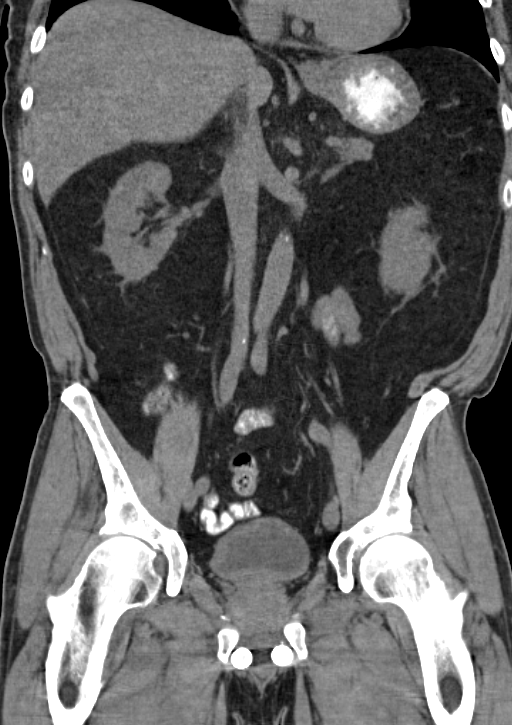
[im 106/142  soft-tissue]
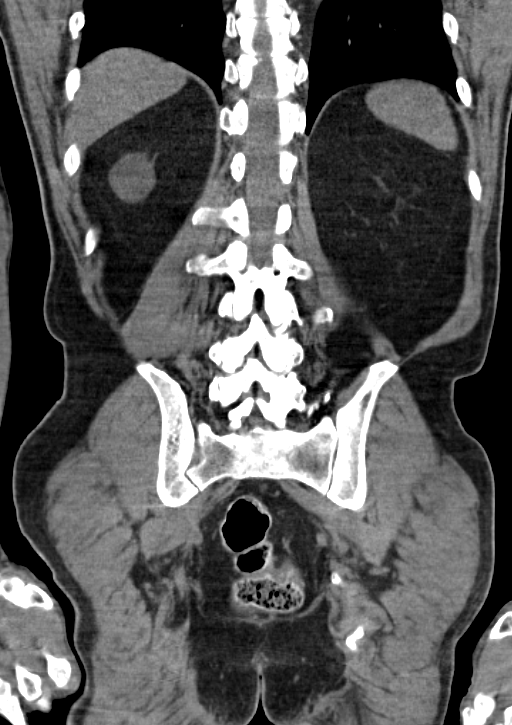

[12 of 46 positions shown; findings below may reference images not displayed]

FINDINGS: Lower chest: There is no pleural effusion identified. The lung bases
are clear.

Hepatobiliary: Mild hepatic steatosis. No focal liver abnormality
identified. The gallbladder appears normal. The common bile duct
measures up to 1 cm.

Pancreas: The pancreatic duct is also prominent measuring 4 mm. No
discrete mass or evidence of common bile duct stone identified. The
pancreatic parenchyma appears normal.

Spleen: Appears normal.

Adrenals/Urinary Tract: The adrenal glands are unremarkable.
Peripherally calcified renal artery aneurysm measures 1 cm. Right
renal cyst is noted measuring 3.2 cm. The left kidney is
unremarkable. The urinary bladder appears normal.

Stomach/Bowel: The stomach is within normal limits. The small bowel
loops have a normal course and caliber. No obstruction. Normal
appearance of the colon.

Vascular/Lymphatic: Calcified atherosclerotic disease involves the
abdominal aorta. No aneurysm. No retroperitoneal or mesenteric
adenopathy. No pelvic or inguinal adenopathy identified.

Reproductive: The prostate gland and seminal vesicles are
unremarkable. There is a penile prosthesis in place. The pump is
ventral to the urinary bladder.

Other: There is no ascites or focal fluid collections within the
abdomen or pelvis.

Musculoskeletal: Review of the visualized osseous structures is
significant for mild lower thoracic and lumbar spine degenerative
disc disease. No aggressive lytic or sclerotic bone lesions
identified.
IMPRESSION: 1. No mass or adenopathy identified within the abdomen or pelvis.
2. Increase caliber of the common bile duct measuring 1 cm. There is
also mild increase caliber of the pancreatic duct. No obstructing
mass or evidence of common bile duct stone identified. If there is a
clinical concern for choledocholithiasis or small ampullary lesion
an MRCP with contrast material may provide a more detailed
assessment of this area.
3. Atherosclerosis.

## 2016-09-07 ENCOUNTER — Encounter: Payer: Self-pay | Admitting: Internal Medicine

## 2016-09-07 ENCOUNTER — Observation Stay
Admission: EM | Admit: 2016-09-07 | Discharge: 2016-09-07 | Disposition: A | Discharge status: Home / Self Care | Payer: Medicare Other | Attending: Internal Medicine | Admitting: Internal Medicine

## 2016-09-07 ENCOUNTER — Emergency Department: Payer: Medicare Other

## 2016-09-07 ENCOUNTER — Observation Stay
Admit: 2016-09-07 | Discharge: 2016-09-07 | Disposition: A | Discharge status: Home / Self Care | Payer: Medicare Other | Attending: Internal Medicine | Admitting: Internal Medicine

## 2016-09-07 DIAGNOSIS — Z955 Presence of coronary angioplasty implant and graft: Secondary | ICD-10-CM | POA: Diagnosis not present

## 2016-09-07 DIAGNOSIS — R079 Chest pain, unspecified: Secondary | ICD-10-CM | POA: Diagnosis present

## 2016-09-07 DIAGNOSIS — I441 Atrioventricular block, second degree: Secondary | ICD-10-CM | POA: Diagnosis not present

## 2016-09-07 DIAGNOSIS — Z8249 Family history of ischemic heart disease and other diseases of the circulatory system: Secondary | ICD-10-CM | POA: Diagnosis not present

## 2016-09-07 DIAGNOSIS — I200 Unstable angina: Secondary | ICD-10-CM

## 2016-09-07 DIAGNOSIS — I10 Essential (primary) hypertension: Secondary | ICD-10-CM | POA: Diagnosis not present

## 2016-09-07 DIAGNOSIS — G4733 Obstructive sleep apnea (adult) (pediatric): Secondary | ICD-10-CM | POA: Diagnosis not present

## 2016-09-07 DIAGNOSIS — E7801 Familial hypercholesterolemia: Secondary | ICD-10-CM | POA: Insufficient documentation

## 2016-09-07 DIAGNOSIS — Z8546 Personal history of malignant neoplasm of prostate: Secondary | ICD-10-CM | POA: Diagnosis not present

## 2016-09-07 DIAGNOSIS — H409 Unspecified glaucoma: Secondary | ICD-10-CM | POA: Insufficient documentation

## 2016-09-07 DIAGNOSIS — R002 Palpitations: Secondary | ICD-10-CM

## 2016-09-07 DIAGNOSIS — M1990 Unspecified osteoarthritis, unspecified site: Secondary | ICD-10-CM | POA: Diagnosis not present

## 2016-09-07 DIAGNOSIS — Z7984 Long term (current) use of oral hypoglycemic drugs: Secondary | ICD-10-CM | POA: Diagnosis not present

## 2016-09-07 DIAGNOSIS — Z833 Family history of diabetes mellitus: Secondary | ICD-10-CM | POA: Diagnosis not present

## 2016-09-07 DIAGNOSIS — N400 Enlarged prostate without lower urinary tract symptoms: Secondary | ICD-10-CM | POA: Insufficient documentation

## 2016-09-07 DIAGNOSIS — Z882 Allergy status to sulfonamides status: Secondary | ICD-10-CM | POA: Diagnosis not present

## 2016-09-07 DIAGNOSIS — R001 Bradycardia, unspecified: Secondary | ICD-10-CM

## 2016-09-07 DIAGNOSIS — I25110 Atherosclerotic heart disease of native coronary artery with unstable angina pectoris: Secondary | ICD-10-CM | POA: Diagnosis not present

## 2016-09-07 DIAGNOSIS — E860 Dehydration: Secondary | ICD-10-CM | POA: Insufficient documentation

## 2016-09-07 DIAGNOSIS — Z7982 Long term (current) use of aspirin: Secondary | ICD-10-CM | POA: Diagnosis not present

## 2016-09-07 LAB — CREATININE, SERUM
CREATININE: 1.26 mg/dL — AB (ref 0.61–1.24)
GFR calc Af Amer: >60 mL/min (ref >60)
GFR, EST NON AFRICAN AMERICAN: 55 mL/min — AB (ref >60)

## 2016-09-07 LAB — COMPREHENSIVE METABOLIC PANEL
ALT: 18 U/L (ref 17–63)
AST: 17 U/L (ref 15–41)
Albumin: 4.1 g/dL (ref 3.5–5.0)
Alkaline Phosphatase: 69 U/L (ref 38–126)
Anion gap: 9 (ref 5–15)
BUN: 22 mg/dL — ABNORMAL HIGH (ref 6–20)
CHLORIDE: 104 mmol/L (ref 101–111)
CO2: 26 mmol/L (ref 22–32)
Calcium: 9.2 mg/dL (ref 8.9–10.3)
Creatinine, Ser: 1.31 mg/dL — ABNORMAL HIGH (ref 0.61–1.24)
GFR, EST NON AFRICAN AMERICAN: 52 mL/min — AB (ref >60)
Glucose, Bld: 129 mg/dL — ABNORMAL HIGH (ref 65–99)
POTASSIUM: 3.9 mmol/L (ref 3.5–5.1)
Sodium: 139 mmol/L (ref 135–145)
Total Bilirubin: 0.9 mg/dL (ref 0.3–1.2)
Total Protein: 7.3 g/dL (ref 6.5–8.1)

## 2016-09-07 LAB — CBC WITH DIFFERENTIAL/PLATELET
BASOS ABS: 0.1 10*3/uL (ref 0–0.1)
Basophils Relative: 1 %
EOS PCT: 3 %
Eosinophils Absolute: 0.3 10*3/uL (ref 0–0.7)
HCT: 49.7 % (ref 40.0–52.0)
Hemoglobin: 16.4 g/dL (ref 13.0–18.0)
LYMPHS PCT: 33 %
Lymphs Abs: 3.3 10*3/uL (ref 1.0–3.6)
MCH: 28.7 pg (ref 26.0–34.0)
MCHC: 33 g/dL (ref 32.0–36.0)
MCV: 86.9 fL (ref 80.0–100.0)
MONO ABS: 1.7 10*3/uL — AB (ref 0.2–1.0)
Monocytes Relative: 17 %
Neutro Abs: 4.7 10*3/uL (ref 1.4–6.5)
Neutrophils Relative %: 46 %
PLATELETS: 199 10*3/uL (ref 150–440)
RBC: 5.72 MIL/uL (ref 4.40–5.90)
RDW: 14.2 % (ref 11.5–14.5)
WBC: 10.2 10*3/uL (ref 3.8–10.6)

## 2016-09-07 LAB — ECHOCARDIOGRAM COMPLETE
HEIGHTINCHES: 69 in
WEIGHTICAEL: 2982.4 [oz_av]

## 2016-09-07 LAB — CBC
HCT: 48.5 % (ref 40.0–52.0)
Hemoglobin: 16 g/dL (ref 13.0–18.0)
MCH: 29.2 pg (ref 26.0–34.0)
MCHC: 33.1 g/dL (ref 32.0–36.0)
MCV: 88.2 fL (ref 80.0–100.0)
Platelets: 185 10*3/uL (ref 150–440)
RBC: 5.49 MIL/uL (ref 4.40–5.90)
RDW: 14.5 % (ref 11.5–14.5)
WBC: 8.5 10*3/uL (ref 3.8–10.6)

## 2016-09-07 LAB — BASIC METABOLIC PANEL
Anion gap: 8 (ref 5–15)
BUN: 21 mg/dL — ABNORMAL HIGH (ref 6–20)
CALCIUM: 9 mg/dL (ref 8.9–10.3)
CO2: 26 mmol/L (ref 22–32)
CREATININE: 1.22 mg/dL (ref 0.61–1.24)
Chloride: 108 mmol/L (ref 101–111)
GFR calc non Af Amer: 57 mL/min — ABNORMAL LOW (ref >60)
Glucose, Bld: 146 mg/dL — ABNORMAL HIGH (ref 65–99)
Potassium: 4.3 mmol/L (ref 3.5–5.1)
SODIUM: 142 mmol/L (ref 135–145)

## 2016-09-07 LAB — LIPID PANEL
CHOL/HDL RATIO: 4 ratio
Cholesterol: 151 mg/dL (ref 0–200)
HDL: 38 mg/dL — ABNORMAL LOW (ref >40)
LDL Cholesterol: 81 mg/dL (ref 0–99)
Triglycerides: 159 mg/dL — ABNORMAL HIGH (ref <150)
VLDL: 32 mg/dL (ref 0–40)

## 2016-09-07 LAB — GLUCOSE, CAPILLARY: GLUCOSE-CAPILLARY: 110 mg/dL — AB (ref 65–99)

## 2016-09-07 LAB — TROPONIN I
Troponin I: <0.03 ng/mL (ref <0.03)
Troponin I: <0.03 ng/mL (ref <0.03)

## 2016-09-07 MED ORDER — ASPIRIN 81 MG PO CHEW
324.0000 mg | CHEWABLE_TABLET | ORAL | Status: AC
  Administered 2016-09-07: 324 mg via ORAL
  Filled 2016-09-07: qty 4

## 2016-09-07 MED ORDER — HYDRALAZINE HCL 20 MG/ML IJ SOLN: 10.0000 mg | INTRAMUSCULAR | Status: DC | PRN

## 2016-09-07 MED ORDER — ACETAMINOPHEN 325 MG PO TABS: 650.0000 mg | ORAL_TABLET | ORAL | Status: DC | PRN

## 2016-09-07 MED ORDER — OXYBUTYNIN CHLORIDE 5 MG PO TABS: 5.0000 mg | ORAL_TABLET | Freq: Three times a day (TID) | ORAL | Status: DC | PRN

## 2016-09-07 MED ORDER — PANTOPRAZOLE SODIUM 40 MG PO TBEC
40.0000 mg | DELAYED_RELEASE_TABLET | Freq: Every day | ORAL | Status: DC
  Administered 2016-09-07: 40 mg via ORAL
  Filled 2016-09-07: qty 1

## 2016-09-07 MED ORDER — GEMFIBROZIL 600 MG PO TABS
600.0000 mg | ORAL_TABLET | Freq: Two times a day (BID) | ORAL | Status: DC
  Administered 2016-09-07: 600 mg via ORAL
  Filled 2016-09-07 (×2): qty 1

## 2016-09-07 MED ORDER — METOPROLOL TARTRATE 25 MG PO TABS: 12.5000 mg | ORAL_TABLET | Freq: Every day | ORAL | 0 refills | Status: DC | PRN

## 2016-09-07 MED ORDER — ONDANSETRON HCL 4 MG/2ML IJ SOLN: 4.0000 mg | Freq: Four times a day (QID) | INTRAMUSCULAR | Status: DC | PRN

## 2016-09-07 MED ORDER — ASPIRIN 325 MG PO TABS
325.0000 mg | ORAL_TABLET | Freq: Every day | ORAL | Status: DC
  Filled 2016-09-07: qty 1

## 2016-09-07 MED ORDER — FINASTERIDE 5 MG PO TABS
5.0000 mg | ORAL_TABLET | Freq: Every day | ORAL | Status: DC
  Administered 2016-09-07: 5 mg via ORAL
  Filled 2016-09-07: qty 1

## 2016-09-07 MED ORDER — ATORVASTATIN CALCIUM 20 MG PO TABS: 10.0000 mg | ORAL_TABLET | Freq: Every day | ORAL | Status: DC

## 2016-09-07 MED ORDER — NITROGLYCERIN 0.4 MG SL SUBL: 0.4000 mg | SUBLINGUAL_TABLET | SUBLINGUAL | Status: DC | PRN

## 2016-09-07 MED ORDER — ENOXAPARIN SODIUM 40 MG/0.4ML ~~LOC~~ SOLN
40.0000 mg | SUBCUTANEOUS | Status: DC
  Administered 2016-09-07: 40 mg via SUBCUTANEOUS
  Filled 2016-09-07: qty 0.4

## 2016-09-07 MED ORDER — DEXTROSE-NACL 5-0.45 % IV SOLN
INTRAVENOUS | Status: DC
  Administered 2016-09-07: 05:00:00 via INTRAVENOUS

## 2016-09-07 MED ORDER — ASPIRIN 300 MG RE SUPP: 300.0000 mg | RECTAL | Status: AC

## 2016-09-07 MED ORDER — ROSUVASTATIN CALCIUM 5 MG PO TABS: 5.0000 mg | ORAL_TABLET | Freq: Every day | ORAL | Status: DC

## 2016-09-07 MED ORDER — NITROGLYCERIN 2 % TD OINT
0.5000 [in_us] | TOPICAL_OINTMENT | Freq: Once | TRANSDERMAL | Status: AC
  Administered 2016-09-07: 0.5 [in_us] via TOPICAL
  Filled 2016-09-07: qty 1

## 2016-09-07 NOTE — H&P (Signed)
[place] [facility] [place] NAME: Jimmy Mcintyre    MR#:  [id number]  DATE OF BIRTH:  08-07-1945  DATE OF ADMISSION:  09/07/2016  PRIMARY CARE PHYSICIAN: Idelle Crouch, MD   REQUESTING/REFERRING PHYSICIAN:   CHIEF COMPLAINT:   Chief Complaint  Patient presents with  . Palpitations    HISTORY OF PRESENT ILLNESS: Jimmy Mcintyre  is a 71 y.o. male with a known history of Coronary artery disease, hypertension, diabetes mellitus, sleep apnea presented to the emergency room with palpitations. Patient noticed palpitations 2 days ago and some vague chest pressure. He took extra dose of metoprolol and presented to the emergency room. His heart rate was very low around 38 bpm. No complaints of any shortness of breath. Patient also says he helped his neighbor by mowing the lawn in the hot sun yesterday. He mowed the lawn for 4 acre property. Feels tired and dehydrated. Hospitalist service was consulted for further care of the patient.  PAST MEDICAL HISTORY:   Past Medical History:  Diagnosis Date  . Abnormal prostate specific antigen 11/16/2013  . Arteriosclerosis of coronary artery 10/29/2014   Overview:  pci stent of lad   . Arthritis, degenerative 09/30/2013   Overview:   a.  Shoulders severe.   b.  Cervical spine.   c.  Lumbar spine   . Benign essential HTN 10/11/2014  . Breath shortness 09/30/2013  . Cancer ([place])   . 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 Diabetes mellitus type 2, uncontrolled ([place]) 11/16/2013  . Essential (primary) hypertension 10/29/2014  . Glaucoma   . Obstructive apnea 04/20/2014  . Sleep apnea   . TI (tricuspid incompetence) 04/29/2014    PAST SURGICAL HISTORY: Past Surgical History:  Procedure Laterality Date  . CORONARY ANGIOPLASTY WITH STENT PLACEMENT  2001  . PENILE PROSTHESIS IMPLANT  2000  . SHOULDER SURGERY Right 2014    SOCIAL HISTORY:  Social History  Substance Use Topics  . Smoking  status: Never Smoker  . Smokeless tobacco: Never Used  . Alcohol use 0.0 oz/week    FAMILY HISTORY:  Family History  Problem Relation Age of Onset  . CAD Mother   . Diabetes Mellitus II Mother   . CAD Father   . Prostate cancer Neg Hx   . Bladder Cancer Neg Hx     DRUG ALLERGIES:  Allergies  Allergen Reactions  . Bactrim [Sulfamethoxazole-Trimethoprim] Rash    REVIEW OF SYSTEMS:   CONSTITUTIONAL: No fever, fatigue or weakness.  EYES: No blurred or double vision.  EARS, NOSE, AND THROAT: No tinnitus or ear pain.  RESPIRATORY: No cough, shortness of breath, wheezing or hemoptysis.  CARDIOVASCULAR: Has chest pain, palpitations No orthopnea, edema.  GASTROINTESTINAL: No nausea, vomiting, diarrhea or abdominal pain.  GENITOURINARY: No dysuria, hematuria.  ENDOCRINE: No polyuria, nocturia,  HEMATOLOGY: No anemia, easy bruising or bleeding SKIN: No rash or lesion. MUSCULOSKELETAL: No joint pain or arthritis.   NEUROLOGIC: No tingling, numbness, weakness.  PSYCHIATRY: No anxiety or depression.   MEDICATIONS AT HOME:  Prior to Admission medications   Medication Sig Start Date End Date Taking? Authorizing Provider  amLODipine-benazepril (LOTREL) 5-20 MG per capsule TAKE 1 CAPSULE BY MOUTH EVERY DAY 11/08/13   [provider]  aspirin 325 MG tablet Take by mouth.    [provider]  aspirin 325 MG tablet Take by mouth.    [provider]  atorvastatin (LIPITOR) 10 MG tablet Take by mouth. 01/30/16  01/29/17  [provider]  cefdinir (OMNICEF) 300 MG capsule Take by mouth. 03/02/16   [provider]  cyanocobalamin (,VITAMIN B-12,) 1000 MCG/ML injection Inject into the muscle. 06/23/14   [provider]  finasteride (PROSCAR) 5 MG tablet Take by mouth. 06/23/14   [provider]  gemfibrozil (LOPID) 600 MG tablet TAKE 1 TABLET TWICE A DAY AS DIRECTED 11/22/14   [provider]  glimepiride (AMARYL) 2 MG tablet   06/23/15   [provider]  metoprolol succinate (TOPROL-XL) 25 MG 24 hr tablet  05/19/15   [provider]  nabumetone (RELAFEN) 750 MG tablet TAKE 1 TABLET TWICE A DAY AS NEEDED FOR PAIN 03/17/14   [provider]  omeprazole (PRILOSEC) 20 MG capsule Take by mouth. 10/05/13   [provider]  oxybutynin (DITROPAN) 5 MG tablet Take 1 tablet (5 mg total) by mouth every 8 (eight) hours as needed for bladder spasms. Patient not taking: Reported on 12/27/2015 11/21/15   Alexis Frock, MD  Testosterone (ANDROGEL) 20.25 MG/1.25GM (1.62%) GEL Apply topically. 04/07/14   [provider]  traMADol Veatrice Bourbon) 50 MG tablet  04/25/15   [provider]  zolpidem (AMBIEN CR) 12.5 MG CR tablet Take by mouth. 04/27/14   [provider]      PHYSICAL EXAMINATION:   VITAL SIGNS: Blood pressure (!) 178/77, pulse 76, temperature 98.2 F (36.8 C), temperature source Oral, resp. rate 10, height 5\' 9"  (1.753 m), weight 83.9 kg (185 lb), SpO2 99 %.  GENERAL:  71 y.o.-year-old patient lying in the bed with no acute distress.  EYES: Pupils equal, round, reactive to light and accommodation. No scleral icterus. Extraocular muscles intact.  HEENT: Head atraumatic, normocephalic. Oropharynx and nasopharynx clear.  NECK:  Supple, no jugular venous distention. No thyroid enlargement, no tenderness.  LUNGS: Normal breath sounds bilaterally, no wheezing, rales,rhonchi or crepitation. No use of accessory muscles of respiration.  CARDIOVASCULAR: S1, S2 bradycardia noted. No murmurs, rubs, or gallops.  ABDOMEN: Soft, nontender, nondistended. Bowel sounds present. No organomegaly or mass.  EXTREMITIES: No pedal edema, cyanosis, or clubbing.  NEUROLOGIC: Cranial nerves II through XII are intact. Muscle strength 5/5 in all extremities. Sensation intact. Gait not checked.  PSYCHIATRIC: The patient is alert and oriented x 3.  SKIN: No obvious rash, lesion, or ulcer.    LABORATORY PANEL:   CBC  Recent Labs Lab 09/07/16 0106  WBC 10.2  HGB 16.4  HCT 49.7  PLT 199  MCV 86.9  MCH 28.7  MCHC 33.0  RDW 14.2  LYMPHSABS 3.3  MONOABS 1.7*  EOSABS 0.3  BASOSABS 0.1   ------------------------------------------------------------------------------------------------------------------  Chemistries   Recent Labs Lab 09/07/16 0106  NA 139  K 3.9  CL 104  CO2 26  GLUCOSE 129*  BUN 22*  CREATININE 1.31*  CALCIUM 9.2  AST 17  ALT 18  ALKPHOS 69  BILITOT 0.9   ------------------------------------------------------------------------------------------------------------------ estimated creatinine clearance is 50.2 mL/min (A) (by C-G formula based on SCr of 1.31 mg/dL (H)). ------------------------------------------------------------------------------------------------------------------ No results for input(s): TSH, T4TOTAL, T3FREE, THYROIDAB in the last 72 hours.  Invalid input(s): FREET3   Coagulation profile No results for input(s): INR, PROTIME in the last 168 hours. ------------------------------------------------------------------------------------------------------------------- No results for input(s): DDIMER in the last 72 hours. -------------------------------------------------------------------------------------------------------------------  Cardiac Enzymes  Recent Labs Lab 09/07/16 0106  TROPONINI <0.03   ------------------------------------------------------------------------------------------------------------------ Invalid input(s): POCBNP  ---------------------------------------------------------------------------------------------------------------  Urinalysis    Component Value Date/Time   COLORURINE YELLOW (A) 05/19/2015 2122   APPEARANCEUR  Clear 12/27/2015 1444   LABSPEC 1.024 05/19/2015 2122   PHURINE 5.0 05/19/2015 2122   GLUCOSEU Negative 12/27/2015 1444   HGBUR NEGATIVE 05/19/2015 2122   BILIRUBINUR  Negative 12/27/2015 [place] 05/19/2015 2122   PROTEINUR Negative 12/27/2015 1444   PROTEINUR NEGATIVE 05/19/2015 2122   NITRITE Negative 12/27/2015 1444   NITRITE NEGATIVE 05/19/2015 2122   LEUKOCYTESUR Negative 12/27/2015 1444     RADIOLOGY: Dg Chest Port 1 View  Result Date: 09/07/2016 CLINICAL DATA:  Palpitations EXAM: PORTABLE CHEST 1 VIEW COMPARISON:  None. FINDINGS: Unchanged cardiomegaly and aortic atherosclerosis. The lungs are clear. No pleural effusion or pneumothorax. Right shoulder hemiarthroplasty and incompletely visualized severe left shoulder osteoarthrosis. IMPRESSION: No active disease. Electronically Signed   By: Ulyses Jarred M.D.   On: 09/07/2016 01:48    EKG: Orders placed or performed during the hospital encounter of 09/07/16  . EKG 12-Lead  . EKG 12-Lead    IMPRESSION AND PLAN: 71 year old male patient with history of coronary artery disease, diabetes meters, hypertension, sleep apnea presented to the emergency room with palpitations. Admitting diagnosis 1. Bradycardia 2. Atypical chest pain 3. Dehydration 4. Coronary artery disease 5. Uncontrolled hypertension Treatment plan It patient to telemetry observation bed Cycle troponin Check echocardiogram Cardiology consultation Control blood pressure hydralazine Hold metoprolol DVT prophylaxis subcutaneous Lovenox 40 MG daily IV fluid hydration  All the records are reviewed and case discussed with ED provider. Management plans discussed with the patient, family and they are in agreement.  CODE STATUS:FULL CODE Code Status History    This patient does not have a recorded code status. Please follow your organizational policy for patients in this situation.       TOTAL TIME TAKING CARE OF THIS PATIENT: 50 minutes.    Saundra Shelling M.D on 09/07/2016 at 3:04 AM  Between 7am to 6pm - Pager - [phone]  After 6pm go to [email or web address] - password [email or web address]  Office  [phone]  CC: Primary care physician; Idelle Crouch, MD

## 2016-09-07 NOTE — Consult Note (Signed)
[facility]  CARDIOLOGY CONSULT NOTE  Patient ID: Jimmy Mcintyre MRN: [id number] DOB/AGE: [birth year] 71 y.o.  Admit date: 09/07/2016 Referring Physician Dr. Vianne Bulls Primary Physician   Primary Cardiologist Dr. Nehemiah Massed Reason for Consultation chest pain  HPI: Pt is a 71 yo male with history of pci approximately 16 years ago in [place] in the [place] with relook cath soon after revealing patent stent with some in stent disease which was not felt to be significant. He has been treated with asa, metoprolol and atorvastatin since then. He has been doing fairly well with the exception of intermitant palpitations and sensation that his heart is "rushing" which most often occurs at night. He presented to the er with chest pain and has ruled out for an mi and ecg was normal with no ischemia. Echo revealed preserved lv function with no valvular or wall motion abnormalities. He has no further symptoms.  EKG on presentation showed sinus bradycardia but patient had taken an extra 1/2 of his metoprolol succinate prior to presenting.   Review of Systems  Constitutional: Negative.   HENT: Negative.   Eyes: Negative.   Respiratory: Negative.   Cardiovascular: Positive for palpitations.  Gastrointestinal: Negative.   Genitourinary: Negative.   Musculoskeletal: Negative.   Skin: Negative.   Neurological: Negative.   Endo/Heme/Allergies: Negative.   Psychiatric/Behavioral: Negative.     Past Medical History:  Diagnosis Date  . Abnormal prostate specific antigen 11/16/2013  . Arteriosclerosis of coronary artery 10/29/2014   Overview:  pci stent of lad   . Arthritis, degenerative 09/30/2013   Overview:   a.  Shoulders severe.   b.  Cervical spine.   c.  Lumbar spine   . Benign essential HTN 10/11/2014  . Breath shortness 09/30/2013  . Cancer ([place])   . 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 Diabetes mellitus type 2, uncontrolled ([place])  11/16/2013  . Essential (primary) hypertension 10/29/2014  . Glaucoma   . Obstructive apnea 04/20/2014  . Sleep apnea   . TI (tricuspid incompetence) 04/29/2014    Family History  Problem Relation Age of Onset  . CAD Mother   . Diabetes Mellitus II Mother   . CAD Father   . Prostate cancer Neg Hx   . Bladder Cancer Neg Hx     Social History   Social History  . Marital status: Married    Spouse name: N/A  . Number of children: N/A  . Years of education: N/A   Occupational History  . retired    Social History Main Topics  . Smoking status: Never Smoker  . Smokeless tobacco: Never Used  . Alcohol use 0.0 oz/week  . Drug use: No  . Sexual activity: Not on file   Other Topics Concern  . Not on file   Social History Narrative  . No narrative on file    Past Surgical History:  Procedure Laterality Date  . CORONARY ANGIOPLASTY WITH STENT PLACEMENT  2001  . PENILE PROSTHESIS IMPLANT  2000  . SHOULDER SURGERY Right 2014     Prescriptions Prior to Admission  Medication Sig Dispense Refill Last Dose  . amLODipine-benazepril (LOTREL) 5-20 MG per capsule TAKE 1 CAPSULE BY MOUTH EVERY DAY   09/06/2016 at 1000  . aspirin 325 MG tablet Take by mouth.   09/06/2016 at 1000  . aspirin 325 MG tablet Take by mouth.   09/06/2016 at 1000  . atorvastatin (LIPITOR) 10 MG tablet Take 10  mg by mouth daily.    09/06/2016 at 1000  . cyanocobalamin (,VITAMIN B-12,) 1000 MCG/ML injection Inject into the muscle.   Past Week at Unknown time  . finasteride (PROSCAR) 5 MG tablet Take 5 mg by mouth daily.    09/06/2016 at 1000  . glimepiride (AMARYL) 2 MG tablet Take 2 mg by mouth daily with breakfast.    09/06/2016 at 1000  . metoprolol succinate (TOPROL-XL) 25 MG 24 hr tablet Take 25 mg by mouth daily.    09/06/2016 at 1000  . nabumetone (RELAFEN) 750 MG tablet TAKE 1 TABLET TWICE A DAY AS NEEDED FOR PAIN   09/06/2016 at 1000  . omeprazole (PRILOSEC) 20 MG capsule Take 20 mg by mouth daily.    09/06/2016 at 1000  .  Testosterone (ANDROGEL) 20.25 MG/1.25GM (1.62%) GEL Apply topically.   09/06/2016 at 1000  . oxybutynin (DITROPAN) 5 MG tablet Take 1 tablet (5 mg total) by mouth every 8 (eight) hours as needed for bladder spasms. (Patient not taking: Reported on 12/27/2015) 90 tablet 11 Not Taking at Unknown time  . traMADol (ULTRAM) 50 MG tablet 50 mg every 6 (six) hours as needed.    prn at prn  . zolpidem (AMBIEN CR) 12.5 MG CR tablet Take 12.5 mg by mouth at bedtime.    09/05/2016 at 2000    Physical Exam: Blood pressure (!) 142/75, pulse 60, temperature 98.2 F (36.8 C), temperature source Oral, resp. rate 17, height 5\' 9"  (1.753 m), weight 84.6 kg (186 lb 6.4 oz), SpO2 97 %.   Wt Readings from Last 1 Encounters:  09/07/16 84.6 kg (186 lb 6.4 oz)     General appearance: alert and cooperative Resp: clear to auscultation bilaterally Chest wall: no tenderness Cardio: regular rate and rhythm GI: soft, non-tender; bowel sounds normal; no masses,  no organomegaly Extremities: extremities normal, atraumatic, no cyanosis or edema Pulses: 2+ and symmetric Neurologic: Grossly normal  Labs:   Lab Results  Component Value Date   WBC 8.5 09/07/2016   HGB 16.0 09/07/2016   HCT 48.5 09/07/2016   MCV 88.2 09/07/2016   PLT 185 09/07/2016    Recent Labs Lab 09/07/16 0106 09/07/16 0435  NA 139 142  K 3.9 4.3  CL 104 108  CO2 26 26  BUN 22* 21*  CREATININE 1.31* 1.26*  1.22  CALCIUM 9.2 9.0  PROT 7.3  --   BILITOT 0.9  --   ALKPHOS 69  --   ALT 18  --   AST 17  --   GLUCOSE 129* 146*   Lab Results  Component Value Date   TROPONINI <0.03 09/07/2016      Radiology: No acute cardiopulmonary disease EKG: sinus bradycardia.   ASSESSMENT AND PLAN:  71 yo with history of lad pci in [place] 16 years ago admitted with palpitaitons and mild chest discomfort. Has ruled out for mi and ekg is unremarkable. Echo showed no significant abnormalities. He takes an extra long acting metoprolol succ on occasion  when his heart is 'rushing". Bradycardia on admission ekg is likely secondary to that. Heart rate has improved. Will continue with asa at 325 mg daily, atorvastatin 10 mg daily. Resume metoprolol succ at 25 mg daily and recommend a metoprolol tartrate 25 mg for breakthrough. OK to ambulate and discharge if stable. Will follow up as outpatient.  Signed: Teodoro Spray MD, [facility] 09/07/2016, 2:30 PM

## 2016-09-07 NOTE — Care Management Obs Status (Signed)
[facility] NOTIFICATION   Patient Details  Name: Jimmy Mcintyre MRN: [id number] Date of Birth: 23-Sep-1945   Medicare Observation Status Notification Given:  Yes    Katrina Stack, RN 09/07/2016, 12:04 PM

## 2016-09-07 NOTE — ED Notes (Signed)
ED Provider at bedside. 

## 2016-09-07 NOTE — Progress Notes (Signed)
Admitted this morning for bradycardia, dehydration, uncontrolled hypertension. Heart rate was low at 38 in ER. But now heart rate is 60. Patient took extra dose of metoprolol at 12.5 mg yesterday evening because he felt palpitations. So he took a morning dose of metoprolol, 12.5 mg of metoprolol at night. He feels fine now. No chest pain or shortness of breath.  Vitals, lab data reviewed.  Physical examination: Blood pressure 157/84. Heart rate 60 in sinus rhythm, O2 saturation 100% on room air.  Alert, awake, oriented. Cardiovascular system S1, S2 regular.   Lungs clear to auscultation, no wheezes, no rales.  1,sinus bradycardia likely due to beta blockers; resolved. Heart rate is better now. Echocardiogram is pending, cardiology consult with Dr.Fath.for discharge meds like metoprolol dose . #3 diabetes mellitus type 2 controlled.  #4.uncontrolled htn;controlled, Time spent;25 min D/w RN and pt.

## 2016-09-07 NOTE — ED Provider Notes (Signed)
[facility] Emergency Department Provider Note   ____________________________________________   First MD Initiated Contact with Patient 09/07/16 [phone]     (approximate)  I have reviewed the triage vital signs and the nursing notes.   HISTORY  Chief Complaint Palpitations    HPI Jimmy Mcintyre is a 71 y.o. male who presents to the ED from home with a chief complaint of chest pain and palpitations. Patient has a history of CAD status post stenting, type 2 diabetes, hypertension who reports intermittent palpitations over the past 2 days, worse at night time. Reports feeling like his "heart is going to jump out of his chest", fast pounding heart rate. Denies irregular heartbeat. Checked it yesterday with a wrist cuff device and his heart rate was 52. He took an extra metoprolol tonight approximately 10 PM without checking his heart rate. Denies recent changes to his metoprolol. Also took a full strength aspirin for chest pressure associated with some shortness of breath. Denies recent fever, chills, cough, congestion, abdominal pain, nausea, vomiting. Denies recent travel or trauma. Nothing makes his symptoms better or worse.   Past Medical History:  Diagnosis Date  . Abnormal prostate specific antigen 11/16/2013  . Arteriosclerosis of coronary artery 10/29/2014   Overview:  pci stent of lad   . Arthritis, degenerative 09/30/2013   Overview:   a.  Shoulders severe.   b.  Cervical spine.   c.  Lumbar spine   . Benign essential HTN 10/11/2014  . Breath shortness 09/30/2013  . Cancer ([place])   . 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 Diabetes mellitus type 2, uncontrolled ([place]) 11/16/2013  . Essential (primary) hypertension 10/29/2014  . Glaucoma   . Obstructive apnea 04/20/2014  . Sleep apnea   . TI (tricuspid incompetence) 04/29/2014    Patient Active Problem List   Diagnosis Date Noted  . 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 Prostate  cancer ([place]) 11/21/2015  . Arteriosclerosis of coronary artery 10/29/2014  . Essential (primary) hypertension 10/29/2014  . 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 Hypogonadism in male 10/29/2014  . Benign prostatic hyperplasia with lower urinary tract symptoms 10/29/2014  . Elevated PSA 10/29/2014  . Erectile dysfunction 10/29/2014  . Benign essential HTN 10/11/2014  . TI (tricuspid incompetence) 04/29/2014  . Obstructive apnea 04/20/2014  . Diabetes mellitus type 2, uncontrolled ([place]) 11/16/2013  . Abnormal prostate specific antigen 11/16/2013  . Type 2 diabetes mellitus ([place]) 11/16/2013  . Arthritis, degenerative 09/30/2013  . Breath shortness 09/30/2013    Past Surgical History:  Procedure Laterality Date  . CORONARY ANGIOPLASTY WITH STENT PLACEMENT  2001  . PENILE PROSTHESIS IMPLANT  2000  . SHOULDER SURGERY Right 2014    Prior to Admission medications   Medication Sig Start Date End Date Taking? Authorizing Provider  amLODipine-benazepril (LOTREL) 5-20 MG per capsule TAKE 1 CAPSULE BY MOUTH EVERY DAY 11/08/13   [provider]  aspirin 325 MG tablet Take by mouth.    [provider]  aspirin 325 MG tablet Take by mouth.    [provider]  atorvastatin (LIPITOR) 10 MG tablet Take by mouth. 01/30/16 01/29/17  [provider]  cefdinir (OMNICEF) 300 MG capsule Take by mouth. 03/02/16   [provider]  cyanocobalamin (,VITAMIN B-12,) 1000 MCG/ML injection Inject into the muscle. 06/23/14   [provider]  finasteride (PROSCAR) 5 MG tablet Take by mouth. 06/23/14   [provider]  gemfibrozil (LOPID) 600 MG tablet TAKE 1  TABLET TWICE A DAY AS DIRECTED 11/22/14   [provider]  glimepiride (AMARYL) 2 MG tablet  06/23/15   [provider]  metoprolol succinate (TOPROL-XL) 25 MG 24 hr tablet  05/19/15   [provider]  nabumetone (RELAFEN) 750 MG tablet TAKE 1 TABLET  TWICE A DAY AS NEEDED FOR PAIN 03/17/14   [provider]  omeprazole (PRILOSEC) 20 MG capsule Take by mouth. 10/05/13   [provider]  oxybutynin (DITROPAN) 5 MG tablet Take 1 tablet (5 mg total) by mouth every 8 (eight) hours as needed for bladder spasms. Patient not taking: Reported on 12/27/2015 11/21/15   Alexis Frock, MD  Testosterone (ANDROGEL) 20.25 MG/1.25GM (1.62%) GEL Apply topically. 04/07/14   [provider]  traMADol Veatrice Bourbon) 50 MG tablet  04/25/15   [provider]  zolpidem (AMBIEN CR) 12.5 MG CR tablet Take by mouth. 04/27/14   [provider]    Allergies Bactrim [sulfamethoxazole-trimethoprim]  Family History  Problem Relation Age of Onset  . Prostate cancer Neg Hx   . Bladder Cancer Neg Hx     Social History Social History  Substance Use Topics  . Smoking status: Never Smoker  . Smokeless tobacco: Never Used  . Alcohol use 0.0 oz/week    Review of Systems  Constitutional: No fever/chills. Eyes: No visual changes. ENT: No sore throat. Cardiovascular: Positive for chest pain. Respiratory: Positive for shortness of breath. Gastrointestinal: No abdominal pain.  No nausea, no vomiting.  No diarrhea.  No constipation. Genitourinary: Negative for dysuria. Musculoskeletal: Negative for back pain. Skin: Negative for rash. Neurological: Negative for headaches, focal weakness or numbness.   ____________________________________________   PHYSICAL EXAM:  VITAL SIGNS: ED Triage Vitals  Enc Vitals Group     BP --      Pulse Rate 09/07/16 0058 (!) 42     Resp 09/07/16 0058 20     Temp 09/07/16 0058 98.2 F (36.8 C)     Temp Source 09/07/16 0058 Oral     SpO2 09/07/16 0058 96 %     Weight 09/07/16 0059 185 lb (83.9 kg)     Height 09/07/16 0059 5\' 9"  (1.753 m)     Head Circumference --      Peak Flow --      Pain Score --      Pain Loc --      Pain Edu? --      Excl. in [place]? --     Constitutional: Alert and  oriented. Well appearing and in no acute distress. Eyes: Conjunctivae are normal. PERRL. EOMI. Head: Atraumatic. Nose: No congestion/rhinnorhea. Mouth/Throat: Mucous membranes are moist.  Oropharynx non-erythematous. Neck: No stridor.   Cardiovascular: Bradycardic rate, regular rhythm. Grossly normal heart sounds.  Good peripheral circulation. Respiratory: Normal respiratory effort.  No retractions. Lungs CTAB. Gastrointestinal: Soft and nontender. No distention. No abdominal bruits. No CVA tenderness. Musculoskeletal: No lower extremity tenderness nor edema.  No joint effusions. Neurologic:  Normal speech and language. No gross focal neurologic deficits are appreciated. No gait instability. Skin:  Skin is warm, dry and intact. No rash noted. Psychiatric: Mood and affect are normal. Speech and behavior are normal.  ____________________________________________   LABS (all labs ordered are listed, but only abnormal results are displayed)  Labs Reviewed  CBC WITH DIFFERENTIAL/PLATELET - Abnormal; Notable for the following:       Result Value   Monocytes Absolute 1.7 (*)    All other components within normal limits  COMPREHENSIVE  METABOLIC PANEL - Abnormal; Notable for the following:    Glucose, Bld 129 (*)    BUN 22 (*)    Creatinine, Ser 1.31 (*)    GFR calc non Af Amer 52 (*)    All other components within normal limits  GLUCOSE, CAPILLARY - Abnormal; Notable for the following:    Glucose-Capillary 110 (*)    All other components within normal limits  TROPONIN I   ____________________________________________  EKG  ED ECG REPORT I, SUNG,JADE J, the attending physician, personally viewed and interpreted this ECG.   Date: 09/07/2016  EKG Time: 0054  Rate: 43  Rhythm: Junctional  Axis: Normal  Intervals:nonspecific intraventricular conduction delay  ST&T Change: Nonspecific  Three-page rhythm strip demonstrates sinus bradycardia with appearance of type II AV  block  ____________________________________________  RADIOLOGY  Dg Chest Port 1 View  Result Date: 09/07/2016 CLINICAL DATA:  Palpitations EXAM: PORTABLE CHEST 1 VIEW COMPARISON:  None. FINDINGS: Unchanged cardiomegaly and aortic atherosclerosis. The lungs are clear. No pleural effusion or pneumothorax. Right shoulder hemiarthroplasty and incompletely visualized severe left shoulder osteoarthrosis. IMPRESSION: No active disease. Electronically Signed   By: Ulyses Jarred M.D.   On: 09/07/2016 01:48    ____________________________________________   PROCEDURES  Procedure(s) performed: None  Procedures  Critical Care performed: No  ____________________________________________   INITIAL IMPRESSION / ASSESSMENT AND PLAN / ED COURSE  Pertinent labs & imaging results that were available during my care of the patient were reviewed by me and considered in my medical decision making (see chart for details).  71 year old male with CAD status post stent, hypertension, type 2 diabetes who presents with palpitations. Symptomatic bradycardia with rhythm strip concerning for type II AV block. Bradycardia most likely secondary to extra 25 mg metoprolol succinate XL tablet taken at 10 PM. Patient already took full strength aspirin for chest pain. Currently pain free. BP 176/78. Will obtain screening cardiac lab work, x-ray. Consider glucagon for profound bradycardia associated with hypotension. Anticipate hospitalization.  Clinical Course as of Sep 08 231  Fri Sep 07, 2016  0232 Updated patient of laboratory and imaging results. He is resting in no acute distress. Heart rate 60. Will discuss with hospitalist to evaluate patient in the emergency department for admission.  [JS]    Clinical Course User Index [JS] Paulette Blanch, MD     ____________________________________________   FINAL CLINICAL IMPRESSION(S) / ED DIAGNOSES  Final diagnoses:  Heart palpitations  Chest pain, unspecified type   Unstable angina (HCC)  Bradycardia  Mobitz type 2 second degree AV block      NEW MEDICATIONS STARTED DURING THIS VISIT:  New Prescriptions   No medications on file     Note:  This document was prepared using Dragon voice recognition software and may include unintentional dictation errors.    Paulette Blanch, MD 09/07/16 [phone]

## 2016-09-07 NOTE — Care Management (Signed)
Placed in observation for bradycardia.  Cardiology consult pending. Independent in all adls, denies issues accessing medical care, obtaining medications or with transportation.  Current with  PCP.  No discharge needs identified at present by care manager or members of care team

## 2016-09-07 NOTE — Progress Notes (Signed)
[name], Jimmy Mcintyre 71 year old man . He was transferred from the ER following c/o palpitation. On admission to the unit, patient was A&O X4, ambulatory, denied being in pain or any discomfort. Patient was oriented to his room, and care plan discussed with patient. Patient remained in asymptomatic sinus brady in the 50s, VSS at this time.

## 2016-09-07 NOTE — Progress Notes (Signed)
*  PRELIMINARY RESULTS* Echocardiogram 2D Echocardiogram has been performed.  Sherrie Sport 09/07/2016, 1:47 PM

## 2016-09-07 NOTE — Plan of Care (Signed)
Problem: Safety: Goal: Ability to remain free from injury will improve Outcome: Completed/Met Date Met: 09/07/16 Pt remained injury free during this admission.

## 2016-09-07 NOTE — ED Notes (Signed)
Patient complaining of heart palpitations with no CP, SOB or any other reported symptoms.  He states he has been having this the last three days.

## 2016-09-07 NOTE — ED Triage Notes (Signed)
Pt states for 2 days has had palpitations with some shob, denies any pain. Pt does take metoprolol states no recent change in meds.

## 2016-09-08 NOTE — Discharge Summary (Signed)
Jimmy Mcintyre, is a 71 y.o. male  DOB [birth year]  MRN [id number].  Admission date:  09/07/2016  Admitting Physician  Saundra Shelling, MD  Discharge Date:  09/07/2016   Primary MD  Idelle Crouch, MD  Recommendations for primary care physician for things to follow:   Follow-up with Dr. Ubaldo Glassing in 2 weeks Follow up with primary doctor in 2 weeks   Admission Diagnosis  Unstable angina (HCC) [I20.0] Bradycardia [R00.1] Heart palpitations [R00.2] Mobitz type 2 second degree AV block [I44.1] Chest pain, unspecified type [R07.9]   Discharge Diagnosis  Unstable angina ([place]) [I20.0] Bradycardia [R00.1] Heart palpitations [R00.2] Mobitz type 2 second degree AV block [I44.1] Chest pain, unspecified type [R07.9]    Active Problems:   Chest pain      Past Medical History:  Diagnosis Date  . Abnormal prostate specific antigen 11/16/2013  . Arteriosclerosis of coronary artery 10/29/2014   Overview:  pci stent of lad   . Arthritis, degenerative 09/30/2013   Overview:   a.  Shoulders severe.   b.  Cervical spine.   c.  Lumbar spine   . Benign essential HTN 10/11/2014  . Breath shortness 09/30/2013  . Cancer ([place])   . 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 Diabetes mellitus type 2, uncontrolled ([place]) 11/16/2013  . Essential (primary) hypertension 10/29/2014  . Glaucoma   . Obstructive apnea 04/20/2014  . Sleep apnea   . TI (tricuspid incompetence) 04/29/2014    Past Surgical History:  Procedure Laterality Date  . CORONARY ANGIOPLASTY WITH STENT PLACEMENT  2001  . PENILE PROSTHESIS IMPLANT  2000  . SHOULDER SURGERY Right 2014       History of present illness and  Hospital Course:     Kindly see H&P for history of present illness and admission details, please review complete Labs, Consult reports and Test  reports for all details in brief  HPI  from the history and physical done on the day of admission 71 year old male patient with history of PCI in [place] came in because of palpitations, patient found to have heart rate of 38 in the emergency room. Admitted for bradycardia. Patient took extra dose of Toprol-XL at home because of palpitations but the time he came to emergency room heart rate was low.   Hospital Course  Sinus bradycardia due to overdose of beta blockers. Sinus bradycardia resolved, seen by cardiology, recommended that he take metoprolol tartrate for breakthrough palpitations if needed instead of long-acting metoprolol. Patient takes Toprol-XL 25 moderate milligrams in the morning, give prescription for metoprolol tartrate 12.5 mg as needed for palpitations with heart rate more than 100. And patient heart rate is in 80s of the time of discharge, he is asymptomatic, seen by cardiology, patient EKG did not show any acute ST changes. Echocardiogram did not show any acute abnormalities. Discharged home in stable condition.  #2 diabetes mellitus type 2: Continue home medicine  #3. h/o prostate cancer: Continue finasteride.     Discharge Condition:stable   Follow UP  Follow-up Information    Idelle Crouch, MD Follow up in 1 week(s).   Specialty:  Internal Medicine Contact information: [place] [address] [phone]        Teodoro Spray, MD Follow up in 1 week(s).   Specialty:  Cardiology Contact information: [place] [address] [phone]        Teodoro Spray, MD .  Specialty:  Cardiology Contact information: [place] [address] [phone]             Discharge Instructions  and  Discharge Medications      Allergies as of 09/07/2016      Reactions   Bactrim [sulfamethoxazole-trimethoprim] Rash      Medication List     TAKE these medications   amLODipine-benazepril 5-20 MG capsule Commonly known as:  LOTREL TAKE 1 CAPSULE BY MOUTH EVERY DAY   ANDROGEL 20.25 MG/1.25GM (1.62%) Gel Generic drug:  Testosterone Apply topically.   aspirin 325 MG tablet Take by mouth. What changed:  Another medication with the same name was removed. Continue taking this medication, and follow the directions you see here.   atorvastatin 10 MG tablet Commonly known as:  LIPITOR Take 10 mg by mouth daily.   cyanocobalamin 1000 MCG/ML injection Commonly known as:  (VITAMIN B-12) Inject into the muscle.   finasteride 5 MG tablet Commonly known as:  PROSCAR Take 5 mg by mouth daily.   glimepiride 2 MG tablet Commonly known as:  AMARYL Take 2 mg by mouth daily with breakfast.   metoprolol succinate 25 MG 24 hr tablet Commonly known as:  TOPROL-XL Take 25 mg by mouth daily.   metoprolol tartrate 25 MG tablet Commonly known as:  LOPRESSOR Take 0.5 tablets (12.5 mg total) by mouth daily as needed.   nabumetone 750 MG tablet Commonly known as:  RELAFEN TAKE 1 TABLET TWICE A DAY AS NEEDED FOR PAIN   omeprazole 20 MG capsule Commonly known as:  PRILOSEC Take 20 mg by mouth daily.   oxybutynin 5 MG tablet Commonly known as:  DITROPAN Take 1 tablet (5 mg total) by mouth every 8 (eight) hours as needed for bladder spasms.   traMADol 50 MG tablet Commonly known as:  ULTRAM 50 mg every 6 (six) hours as needed.   zolpidem 12.5 MG CR tablet Commonly known as:  AMBIEN CR Take 12.5 mg by mouth at bedtime.         Diet and Activity recommendation: See Discharge Instructions above   Consults obtained - cardio   Major procedures and Radiology Reports - PLEASE review detailed and final reports for all details, in brief -     Dg Chest Port 1 View  Result Date: 09/07/2016 CLINICAL DATA:  Palpitations EXAM: PORTABLE CHEST 1 VIEW COMPARISON:  None. FINDINGS: Unchanged cardiomegaly and aortic atherosclerosis.  The lungs are clear. No pleural effusion or pneumothorax. Right shoulder hemiarthroplasty and incompletely visualized severe left shoulder osteoarthrosis. IMPRESSION: No active disease. Electronically Signed   By: Ulyses Jarred M.D.   On: 09/07/2016 01:48    Micro Results     No results found for this or any previous visit (from the past 240 hour(s)).     Today   Subjective:   Jimmy Mcintyre ,Stable for discharge. Denies any complaints of chest pain or shortness of breath or palpitations.  Objective:   Blood pressure (!) 142/75, pulse 60, temperature 98.2 F (36.8 C), temperature source Oral, resp. rate 17, height 5\' 9"  (1.753 m), weight 84.6 kg (186 lb 6.4 oz), SpO2 97 %.   Intake/Output Summary (Last 24 hours) at 09/08/16 1133 Last data filed at 09/07/16 1321  Gross per 24 hour  Intake              240 ml  Output                0 ml  Net  240 ml    Exam Awake Alert, Oriented x 3, No new F.N deficits, Normal affect [place].AT,PERRAL Supple Neck,No JVD, No cervical lymphadenopathy appriciated.  Symmetrical Chest wall movement, Good air movement bilaterally, CTAB RRR,No Gallops,Rubs or new Murmurs, No Parasternal Heave +ve B.Sounds, Abd Soft, Non tender, No organomegaly appriciated, No rebound -guarding or rigidity. No Cyanosis, Clubbing or edema, No new Rash or bruise  Data Review   CBC w Diff: Lab Results  Component Value Date   WBC 8.5 09/07/2016   HGB 16.0 09/07/2016   HGB 14.8 10/12/2013   HCT 48.5 09/07/2016   HCT 47.0 10/12/2013   PLT 185 09/07/2016   PLT 254 10/12/2013   LYMPHOPCT 33 09/07/2016   LYMPHOPCT 29.2 10/12/2013   MONOPCT 17 09/07/2016   MONOPCT 13.7 10/12/2013   EOSPCT 3 09/07/2016   EOSPCT 1.7 10/12/2013   BASOPCT 1 09/07/2016   BASOPCT 0.9 10/12/2013    CMP: Lab Results  Component Value Date   NA 142 09/07/2016   K 4.3 09/07/2016   CL 108 09/07/2016   CO2 26 09/07/2016   BUN 21 (H) 09/07/2016   CREATININE 1.26 (H)  09/07/2016   CREATININE 1.22 09/07/2016   PROT 7.3 09/07/2016   ALBUMIN 4.1 09/07/2016   BILITOT 0.9 09/07/2016   ALKPHOS 69 09/07/2016   AST 17 09/07/2016   ALT 18 09/07/2016  .   Total Time in preparing paper work, data evaluation and todays exam - 41 minutes  Markee Matera M.D on 09/07/16 at 11:33 AM    Note: This dictation was prepared with Dragon dictation along with smaller phrase technology. Any transcriptional errors that result from this process are unintentional.

## 2016-09-17 DIAGNOSIS — R001 Bradycardia, unspecified: Secondary | ICD-10-CM | POA: Insufficient documentation

## 2017-02-19 ENCOUNTER — Other Ambulatory Visit: Payer: Self-pay | Admitting: Physician Assistant

## 2017-02-19 ENCOUNTER — Emergency Department
Admission: EM | Admit: 2017-02-19 | Discharge: 2017-02-19 | Disposition: A | Discharge status: Home / Self Care | Payer: Medicare Other | Attending: Emergency Medicine | Admitting: Emergency Medicine

## 2017-02-19 ENCOUNTER — Ambulatory Visit
Admission: RE | Admit: 2017-02-19 | Discharge: 2017-02-19 | Disposition: A | Discharge status: Home / Self Care | Payer: Medicare Other | Source: Ambulatory Visit | Attending: Physician Assistant | Admitting: Physician Assistant

## 2017-02-19 ENCOUNTER — Encounter: Payer: Self-pay | Admitting: Emergency Medicine

## 2017-02-19 DIAGNOSIS — R1011 Right upper quadrant pain: Secondary | ICD-10-CM

## 2017-02-19 DIAGNOSIS — Z95818 Presence of other cardiac implants and grafts: Secondary | ICD-10-CM | POA: Insufficient documentation

## 2017-02-19 DIAGNOSIS — Z79899 Other long term (current) drug therapy: Secondary | ICD-10-CM | POA: Insufficient documentation

## 2017-02-19 DIAGNOSIS — I2510 Atherosclerotic heart disease of native coronary artery without angina pectoris: Secondary | ICD-10-CM | POA: Diagnosis not present

## 2017-02-19 DIAGNOSIS — K760 Fatty (change of) liver, not elsewhere classified: Secondary | ICD-10-CM

## 2017-02-19 DIAGNOSIS — I10 Essential (primary) hypertension: Secondary | ICD-10-CM | POA: Insufficient documentation

## 2017-02-19 DIAGNOSIS — K8020 Calculus of gallbladder without cholecystitis without obstruction: Secondary | ICD-10-CM

## 2017-02-19 DIAGNOSIS — Z8546 Personal history of malignant neoplasm of prostate: Secondary | ICD-10-CM | POA: Insufficient documentation

## 2017-02-19 MED ORDER — AMOXICILLIN-POT CLAVULANATE 875-125 MG PO TABS: 1.0000 | ORAL_TABLET | Freq: Two times a day (BID) | ORAL | 0 refills | Status: AC

## 2017-02-19 MED ORDER — AMOXICILLIN-POT CLAVULANATE 875-125 MG PO TABS
1.0000 | ORAL_TABLET | Freq: Once | ORAL | Status: AC
  Administered 2017-02-19: 1 via ORAL
  Filled 2017-02-19: qty 1

## 2017-02-19 NOTE — Consult Note (Signed)
Date of Consultation:  02/19/2017  Requesting Physician:  Larae Grooms, MD  Reason for Consultation:  Abdominal pain  History of Present Illness: Jimmy Mcintyre is a 71 y.o. male who presents sent from [facility] clinic for evaluation of abdominal pain.  He had actually been having right toe pain from a gout flare up over the past three weeks and also started having epigastric to right upper quadrant pain since 12/16.  He had an episode of fever on 12/16 as well.  Denies any associated nausea or vomiting and denies any correlation of the pain with oral intake.  He has had some decreased appetite but is hungry today.  He went to his PCP and was given Kenalog injection for his gout and U/S and labs were also obtained.  He has WBC of 18, otherwise normal LFTs.  His ultrasound showed cholelithiasis with gallbladder wall of 4 mm thickness and without pericholecystic fluid.  Past Medical History: Past Medical History:  Diagnosis Date  . Abnormal prostate specific antigen 11/16/2013  . Arteriosclerosis of coronary artery 10/29/2014   Overview:  pci stent of lad   . Arthritis, degenerative 09/30/2013   Overview:   a.  Shoulders severe.   b.  Cervical spine.   c.  Lumbar spine   . Benign essential HTN 10/11/2014  . Breath shortness 09/30/2013  . Cancer ([place])   . 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 Diabetes mellitus type 2, uncontrolled ([place]) 11/16/2013  . Essential (primary) hypertension 10/29/2014  . Glaucoma   . Obstructive apnea 04/20/2014  . Sleep apnea   . TI (tricuspid incompetence) 04/29/2014     Past Surgical History: Past Surgical History:  Procedure Laterality Date  . CORONARY ANGIOPLASTY WITH STENT PLACEMENT  2001  . PENILE PROSTHESIS IMPLANT  2000  . SHOULDER SURGERY Right 2014    Home Medications: Prior to Admission medications   Medication Sig Start Date End Date Taking? Authorizing Provider  amLODipine-benazepril (LOTREL) 5-20 MG per capsule TAKE 1  CAPSULE BY MOUTH EVERY DAY 11/08/13   [provider]  aspirin 325 MG tablet Take by mouth.    [provider]  atorvastatin (LIPITOR) 10 MG tablet Take 10 mg by mouth daily.  01/30/16 01/29/17  [provider]  cyanocobalamin (,VITAMIN B-12,) 1000 MCG/ML injection Inject into the muscle. 06/23/14   [provider]  finasteride (PROSCAR) 5 MG tablet Take 5 mg by mouth daily.  06/23/14   [provider]  glimepiride (AMARYL) 2 MG tablet Take 2 mg by mouth daily with breakfast.  06/23/15   [provider]  metoprolol succinate (TOPROL-XL) 25 MG 24 hr tablet Take 25 mg by mouth daily.  05/19/15   [provider]  metoprolol tartrate (LOPRESSOR) 25 MG tablet Take 0.5 tablets (12.5 mg total) by mouth daily as needed. 09/07/16 09/07/17  Epifanio Lesches, MD  nabumetone (RELAFEN) 750 MG tablet TAKE 1 TABLET TWICE A DAY AS NEEDED FOR PAIN 03/17/14   [provider]  omeprazole (PRILOSEC) 20 MG capsule Take 20 mg by mouth daily.  10/05/13   [provider]  oxybutynin (DITROPAN) 5 MG tablet Take 1 tablet (5 mg total) by mouth every 8 (eight) hours as needed for bladder spasms. Patient not taking: Reported on 12/27/2015 11/21/15   Alexis Frock, MD  Testosterone (ANDROGEL) 20.25 MG/1.25GM (1.62%) GEL Apply topically. 04/07/14   [provider]  traMADol (ULTRAM) 50 MG tablet 50 mg every 6 (six) hours as needed.  04/25/15  [provider]  zolpidem (AMBIEN CR) 12.5 MG CR tablet Take 12.5 mg by mouth at bedtime.  04/27/14   [provider]    Allergies: Allergies  Allergen Reactions  . Bactrim [Sulfamethoxazole-Trimethoprim] Rash    Social History:  reports that  has never smoked. he has never used smokeless tobacco. He reports that he drinks alcohol. He reports that he does not use drugs.   Family History: Family History  Problem Relation Age of Onset  . CAD Mother   . Diabetes Mellitus II Mother   .  CAD Father   . Prostate cancer Neg Hx   . Bladder Cancer Neg Hx     Review of Systems: Review of Systems  Constitutional: Positive for chills and fever.  HENT: Negative for hearing loss.   Eyes: Negative for blurred vision.  Respiratory: Negative for shortness of breath.   Cardiovascular: Negative for chest pain.  Gastrointestinal: Positive for abdominal pain. Negative for constipation, diarrhea, nausea and vomiting.  Genitourinary: Negative for dysuria.  Musculoskeletal: Negative for myalgias.  Skin: Negative for rash.  Neurological: Negative for dizziness.  Psychiatric/Behavioral: Negative for depression.  All other systems reviewed and are negative.   Physical Exam BP 130/84 (BP Location: Left Arm)   Pulse 87   Temp 98.5 F (36.9 C) (Oral)   Resp 18   Ht 5\' 9"  (1.753 m)   Wt 83.9 kg (185 lb)   SpO2 95%   BMI 27.32 kg/m  CONSTITUTIONAL: No acute distress HEENT:  Normocephalic, atraumatic, extraocular motion intact. NECK: Trachea is midline, and there is no jugular venous distension.  RESPIRATORY:  Lungs are clear, and breath sounds are equal bilaterally. Normal respiratory effort without pathologic use of accessory muscles. CARDIOVASCULAR: Heart is regular without murmurs, gallops, or rubs. GI: The abdomen is soft, nondistended, with mild tenderness to palpation over epigastric and right upper quadrant.  Negative Murphy's sign. There were no palpable masses.  MUSCULOSKELETAL:  Normal muscle strength and tone in all four extremities.  No peripheral edema or cyanosis. SKIN: Skin turgor is normal. There are no pathologic skin lesions.  NEUROLOGIC:  Motor and sensation is grossly normal.  Cranial nerves are grossly intact. PSYCH:  Alert and oriented to person, place and time. Affect is normal.  Laboratory Analysis: Outpatient labs show WBC 18.3, Cr 1.3, normal LFTs, and normal U/A.  Imaging: US Abdomen Limited Ruq  Result Date: 02/19/2017 CLINICAL DATA:  Right upper  quadrant abdominal pain for 2 days EXAM: ULTRASOUND ABDOMEN LIMITED RIGHT UPPER QUADRANT COMPARISON:  CT abdomen 02/23/2014 FINDINGS: Gallbladder: Sludge and stones are present in the gallbladder. A larger stone is 5 mm. Mild gallbladder wall thickening measures 4 mm. Negative sonographic Murphy's sign. Common bile duct: Diameter: 10 mm Liver: The liver is diffusely increased in echogenicity without focal mass. There is a focal area of hypoechoic signal with a geographic border adjacent to the gallbladder most likely focal fatty sparing. This can be seen on the prior study. See image 21 of series 7. Portal vein is patent on color Doppler imaging with normal direction of blood flow towards the liver. IMPRESSION: Cholelithiasis and gallbladder sludge. There is nonspecific gallbladder wall thickening. The common bile duct is dilated suggesting biliary obstruction. MRCP or ERCP is recommended to further characterize. Diffuse hepatic steatosis with relative sparing adjacent to the gallbladder. Electronically Signed   By: Marybelle Killings M.D.   On: 02/19/2017 12:19    Assessment and Plan: This is a 71 y.o. male who presents with  abdomina pain and gout flare.  I have independently viewed the patient's imaging study and his laboratory studies.  His ultrasound does not show any significant evidence for cholecystitis and he has a negative Murphy's sign.  His WBC is elevated to 18.3, but he is also in the middle of a gout flare up.  Discussed with patient possible options for management including outpatient and inpatient, conservative and surgical.  He has opted for outpatient management and will have a po trial in the ED and if he tolerates, can be discharge to home with 2 week course of Augmentin and follow up in surgery office with me.  He is aware that if he fails po trial, has worsening symptoms at home, or other concerns, to come to hospital for further evaluation.  He understands this plan and all of his questions  have been answered.   Melvyn Neth, [place]

## 2017-02-19 NOTE — ED Notes (Signed)

## 2017-02-19 NOTE — ED Provider Notes (Signed)
[facility] Emergency Department Provider Note  ____________________________________________   First MD Initiated Contact with Patient 02/19/17 1705     (approximate)  I have reviewed the triage vital signs and the nursing notes.   HISTORY  Chief Complaint Abdominal Pain   HPI Jimmy Mcintyre is a 71 y.o. male with a 2-day history of right upper quadrant pain who is presenting to the emergency department after being referred here by his primary care doctor.  He was seen earlier in the day for gout at his primary care doctor's office.  However, to the right upper quadrant pain he was sent for ultrasound as well as blood work.  He was found to have a white blood cell count of 18.3 as well as sludge, stones and gallbladder wall thickening as well as common bile duct dilatation.  He says that the pain is minimal at this time and that he denies any nausea.   Past Medical History:  Diagnosis Date  . Abnormal prostate specific antigen 11/16/2013  . Arteriosclerosis of coronary artery 10/29/2014   Overview:  pci stent of lad   . Arthritis, degenerative 09/30/2013   Overview:   a.  Shoulders severe.   b.  Cervical spine.   c.  Lumbar spine   . Benign essential HTN 10/11/2014  . Breath shortness 09/30/2013  . Cancer ([place])   . 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 Diabetes mellitus type 2, uncontrolled ([place]) 11/16/2013  . Essential (primary) hypertension 10/29/2014  . Glaucoma   . Obstructive apnea 04/20/2014  . Sleep apnea   . TI (tricuspid incompetence) 04/29/2014    Patient Active Problem List   Diagnosis Date Noted  . Chest pain 09/07/2016  . 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 Prostate cancer ([place]) 11/21/2015  . Arteriosclerosis of coronary artery 10/29/2014  . Essential (primary) hypertension 10/29/2014  . 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 Hypogonadism in male 10/29/2014  . Benign prostatic  hyperplasia with lower urinary tract symptoms 10/29/2014  . Elevated PSA 10/29/2014  . Erectile dysfunction 10/29/2014  . Benign essential HTN 10/11/2014  . TI (tricuspid incompetence) 04/29/2014  . Obstructive apnea 04/20/2014  . Diabetes mellitus type 2, uncontrolled ([place]) 11/16/2013  . Abnormal prostate specific antigen 11/16/2013  . Type 2 diabetes mellitus ([place]) 11/16/2013  . Arthritis, degenerative 09/30/2013  . Breath shortness 09/30/2013    Past Surgical History:  Procedure Laterality Date  . CORONARY ANGIOPLASTY WITH STENT PLACEMENT  2001  . PENILE PROSTHESIS IMPLANT  2000  . SHOULDER SURGERY Right 2014    Prior to Admission medications   Medication Sig Start Date End Date Taking? Authorizing Provider  amLODipine-benazepril (LOTREL) 5-20 MG per capsule TAKE 1 CAPSULE BY MOUTH EVERY DAY 11/08/13   [provider]  aspirin 325 MG tablet Take by mouth.    [provider]  atorvastatin (LIPITOR) 10 MG tablet Take 10 mg by mouth daily.  01/30/16 01/29/17  [provider]  cyanocobalamin (,VITAMIN B-12,) 1000 MCG/ML injection Inject into the muscle. 06/23/14   [provider]  finasteride (PROSCAR) 5 MG tablet Take 5 mg by mouth daily.  06/23/14   [provider]  glimepiride (AMARYL) 2 MG tablet Take 2 mg by mouth daily with breakfast.  06/23/15   [provider]  metoprolol succinate (TOPROL-XL) 25 MG 24 hr tablet Take 25 mg by mouth daily.  05/19/15   [provider]  metoprolol tartrate (LOPRESSOR) 25 MG tablet Take 0.5 tablets (  12.5 mg total) by mouth daily as needed. 09/07/16 09/07/17  Epifanio Lesches, MD  nabumetone (RELAFEN) 750 MG tablet TAKE 1 TABLET TWICE A DAY AS NEEDED FOR PAIN 03/17/14   [provider]  omeprazole (PRILOSEC) 20 MG capsule Take 20 mg by mouth daily.  10/05/13   [provider]  oxybutynin (DITROPAN) 5 MG tablet Take 1 tablet (5 mg total) by mouth every 8 (eight) hours as needed  for bladder spasms. Patient not taking: Reported on 12/27/2015 11/21/15   Alexis Frock, MD  Testosterone (ANDROGEL) 20.25 MG/1.25GM (1.62%) GEL Apply topically. 04/07/14   [provider]  traMADol (ULTRAM) 50 MG tablet 50 mg every 6 (six) hours as needed.  04/25/15   [provider]  zolpidem (AMBIEN CR) 12.5 MG CR tablet Take 12.5 mg by mouth at bedtime.  04/27/14   [provider]    Allergies Bactrim [sulfamethoxazole-trimethoprim]  Family History  Problem Relation Age of Onset  . CAD Mother   . Diabetes Mellitus II Mother   . CAD Father   . Prostate cancer Neg Hx   . Bladder Cancer Neg Hx     Social History Social History   Tobacco Use  . Smoking status: Never Smoker  . Smokeless tobacco: Never Used  Substance Use Topics  . Alcohol use: Yes    Alcohol/week: 0.0 oz  . Drug use: No    Review of Systems  Constitutional: No fever/chills Eyes: No visual changes. ENT: No sore throat. Cardiovascular: Denies chest pain. Respiratory: Denies shortness of breath. Gastrointestinal: No abdominal pain.  No nausea, no vomiting.  No diarrhea.  No constipation. Genitourinary: Negative for dysuria. Musculoskeletal: Negative for back pain. Skin: Negative for rash. Neurological: Negative for headaches, focal weakness or numbness.   ____________________________________________   PHYSICAL EXAM:  VITAL SIGNS: ED Triage Vitals  Enc Vitals Group     BP 02/19/17 1519 138/76     Pulse Rate 02/19/17 1519 86     Resp 02/19/17 1519 18     Temp 02/19/17 1519 98.7 F (37.1 C)     Temp Source 02/19/17 1519 Oral     SpO2 02/19/17 1519 97 %     Weight 02/19/17 1519 185 lb (83.9 kg)     Height 02/19/17 1519 5\' 9"  (1.753 m)     Head Circumference --      Peak Flow --      Pain Score 02/19/17 1527 3     Pain Loc --      Pain Edu? --      Excl. in [place]? --     Constitutional: Alert and oriented. Well appearing and in no acute distress. Eyes: Conjunctivae  are normal.  Head: Atraumatic. Nose: No congestion/rhinnorhea. Mouth/Throat: Mucous membranes are moist.  Neck: No stridor.   Cardiovascular: Normal rate, regular rhythm. Grossly normal heart sounds.  Good peripheral circulation. Respiratory: Normal respiratory effort.  No retractions. Lungs CTAB. Gastrointestinal: Soft with minimal right upper quadrant tenderness to palpation.  With an equivocal Murphy sign.  Patient stops breathing about halfway through his inspiration. No distention.  Musculoskeletal: No lower extremity tenderness nor edema.  No joint effusions. Neurologic:  Normal speech and language. No gross focal neurologic deficits are appreciated. Skin:  Skin is warm, dry and intact. No rash noted. Psychiatric: Mood and affect are normal. Speech and behavior are normal.  ____________________________________________   LABS (all labs ordered are listed, but only abnormal results are displayed)  Labs Reviewed - No data to display  ____________________________________________  EKG   ____________________________________________  RADIOLOGY  Stones, sludge and 0.4 mm thick wall.  Also with common bile duct distention ____________________________________________   PROCEDURES  Procedure(s) performed:   Procedures  Critical Care performed:   ____________________________________________   INITIAL IMPRESSION / ASSESSMENT AND PLAN / ED COURSE  Pertinent labs & imaging results that were available during my care of the patient were reviewed by me and considered in my medical decision making (see chart for details).  Differential diagnosis includes, but is not limited to, biliary disease (biliary colic, acute cholecystitis, cholangitis, choledocholithiasis, etc), intrathoracic causes for epigastric abdominal pain including ACS, gastritis, duodenitis, pancreatitis, small bowel or large bowel obstruction, abdominal aortic aneurysm, hernia, and gastritis. As part of my medical  decision making, I reviewed the following data within the [place] chart reviewed  ----------------------------------------- 6:07 PM on 02/19/2017 -----------------------------------------  Discussed case with Dr. Hampton Abbot of surgery who will be coming to the ER to evaluate the patient.    ----------------------------------------- 6:56 PM on 02/19/2017 -----------------------------------------  Patient was evaluated by Dr. Hampton Abbot in the emergency department who found the patient to have a negative Murphy sign.  Dr. Hampton Abbot thinks that the elevated white blood cell count may also be related to the patient's gout.  Dr. Hampton Abbot does not think the patient needs to stay in the hospital at this time and thinks that he will be safe to be discharged home on Augmentin.  Patient is tolerating p.o. crackers as well as juice in the emergency department without issue.  Patient will be discharged at this time.  He knows to call surgery in the morning for follow-up.  Normal bili.  Unlikely to be bile duct obstruction.  ____________________________________________   FINAL CLINICAL IMPRESSION(S) / ED DIAGNOSES  Right upper quadrant pain.    NEW MEDICATIONS STARTED DURING THIS VISIT:  This SmartLink is deprecated. Use AVSMEDLIST instead to display the medication list for a patient.   Note:  This document was prepared using Dragon voice recognition software and may include unintentional dictation errors.     Orbie Pyo, MD 02/19/17 [phone]

## 2017-02-19 NOTE — ED Triage Notes (Addendum)
Pt to ED from [facility] walk in for rule out choley, Pt c/o RUQ abd pain x2days with intermit fever. Pt A&Ox4, VS stable. Per [facility] provider WBC 18. Blood work and [place] was done in [facility] prior to ED arrival, see chart . Pt in NAD at this time

## 2017-02-20 ENCOUNTER — Other Ambulatory Visit: Payer: Self-pay | Admitting: *Deleted

## 2017-03-06 ENCOUNTER — Other Ambulatory Visit: Payer: Self-pay

## 2017-03-06 DIAGNOSIS — I2510 Atherosclerotic heart disease of native coronary artery without angina pectoris: Secondary | ICD-10-CM | POA: Insufficient documentation

## 2017-03-06 DIAGNOSIS — E782 Mixed hyperlipidemia: Secondary | ICD-10-CM | POA: Insufficient documentation

## 2017-03-11 ENCOUNTER — Ambulatory Visit (INDEPENDENT_AMBULATORY_CARE_PROVIDER_SITE_OTHER): Payer: Medicare Other | Admitting: Surgery

## 2017-03-11 ENCOUNTER — Encounter: Payer: Self-pay | Admitting: Surgery

## 2017-03-11 VITALS — BP 145/88 | HR 67 | Temp 98.2°F | Ht 69.0 in | Wt 181.0 lb

## 2017-03-11 DIAGNOSIS — K8020 Calculus of gallbladder without cholecystitis without obstruction: Secondary | ICD-10-CM | POA: Diagnosis not present

## 2017-03-11 NOTE — Progress Notes (Signed)
03/11/2017  History of Present Illness: Jimmy Mcintyre is a 72 y.o. male who presents as a follow-up for abdominal pain with cholelithiasis seen in the emergency room on 02/19/17.  At that point his ultrasound had only mild gallbladder wall thickening of 4 mm with no pericholecystic fluid and negative Murphy's sign.  His WBC was elevated to 18.3, but he was also having a gout flareup and had been given a Kenalog injection as an outpatient.  He was treated with antibiotics as an outpatient presents today for further follow-up.  He reports that his pain resolved by the next day and since then has not had any issues with abdominal pain, nausea, vomiting, fever, or chills.  Has not required any pain medication at home.  Past Medical History: Past Medical History:  Diagnosis Date  . Abnormal prostate specific antigen 11/16/2013  . Arteriosclerosis of coronary artery 10/29/2014   Overview:  pci stent of lad   . Arthritis, degenerative 09/30/2013   Overview:   a.  Shoulders severe.   b.  Cervical spine.   c.  Lumbar spine   . Benign essential HTN 10/11/2014  . Breath shortness 09/30/2013  . Cancer ([place])   . 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 Diabetes mellitus type 2, uncontrolled ([place]) 11/16/2013  . Essential (primary) hypertension 10/29/2014  . Glaucoma   . Obstructive apnea 04/20/2014  . Sleep apnea   . TI (tricuspid incompetence) 04/29/2014     Past Surgical History: Past Surgical History:  Procedure Laterality Date  . CORONARY ANGIOPLASTY WITH STENT PLACEMENT  2001  . PENILE PROSTHESIS IMPLANT  2000  . SHOULDER SURGERY Right 2014    Home Medications: Prior to Admission medications   Medication Sig Start Date End Date Taking? Authorizing Provider  alfuzosin (UROXATRAL) 10 MG 24 hr tablet Take by mouth. 12/27/15  Yes [provider]  amLODipine-benazepril (LOTREL) 5-20 MG per capsule TAKE 1 CAPSULE BY MOUTH EVERY DAY 11/08/13  Yes [provider]   aspirin 325 MG tablet Take by mouth.   Yes [provider]  cyanocobalamin (,VITAMIN B-12,) 1000 MCG/ML injection Inject into the muscle. 06/23/14  Yes [provider]  finasteride (PROSCAR) 5 MG tablet Take 5 mg by mouth daily.  06/23/14  Yes [provider]  FLUZONE HIGH-DOSE 0.5 ML injection ADM 0.5ML IM UTD 12/21/16  Yes [provider]  gemfibrozil (LOPID) 600 MG tablet Take by mouth.   Yes [provider]  glimepiride (AMARYL) 2 MG tablet Take 2 mg by mouth daily with breakfast.  06/23/15  Yes [provider]  metoprolol succinate (TOPROL-XL) 25 MG 24 hr tablet Take 25 mg by mouth daily.  05/19/15  Yes [provider]  metoprolol tartrate (LOPRESSOR) 25 MG tablet Take 0.5 tablets (12.5 mg total) by mouth daily as needed. 09/07/16 09/07/17 Yes Epifanio Lesches, MD  nabumetone (RELAFEN) 750 MG tablet TAKE 1 TABLET TWICE A DAY AS NEEDED FOR PAIN 03/17/14  Yes [provider]  omeprazole (PRILOSEC) 20 MG capsule Take 20 mg by mouth daily.  10/05/13  Yes [provider]  SYRINGE/NEEDLE, DISP, 1 ML (BD [employer]TE[employer]RA SYRINGE) 25G X 5/8" 1 ML MISC Use 1 Syringe monthly. 09/26/16  Yes [provider]  Testosterone (ANDROGEL) 20.25 MG/1.25GM (1.62%) GEL Apply topically. 04/07/14  Yes [provider]  zolpidem (AMBIEN CR) 12.5 MG CR tablet Take 12.5 mg by mouth at bedtime.  04/27/14  Yes [provider]  atorvastatin (LIPITOR) 10 MG tablet Take 10  mg by mouth daily.  01/30/16 01/29/17  [provider]    Allergies: Allergies  Allergen Reactions  . Bactrim [Sulfamethoxazole-Trimethoprim] Rash    Review of Systems: Review of Systems  Constitutional: Negative for chills and fever.  Respiratory: Negative for shortness of breath.   Cardiovascular: Negative for chest pain.  Gastrointestinal: Negative for abdominal pain, nausea and vomiting.    Physical Exam BP (!) 145/88   Pulse 67   Temp 98.2  F (36.8 C) (Oral)   Ht 5\' 9"  (1.753 m)   Wt 82.1 kg (181 lb)   BMI 26.73 kg/m  CONSTITUTIONAL: No acute distress RESPIRATORY:  Lungs are clear, and breath sounds are equal bilaterally. Normal respiratory effort without pathologic use of accessory muscles. CARDIOVASCULAR: Heart is regular without murmurs, gallops, or rubs. GI: The abdomen is soft, nondistended, nontender to palpation.  SKIN: Skin turgor is normal. There are no pathologic skin lesions.  NEUROLOGIC:  Motor and sensation is grossly normal.  Cranial nerves are grossly intact. PSYCH:  Alert and oriented to person, place and time. Affect is normal.  Labs/Imaging: None recently  Assessment and Plan: This is a 72 y.o. male who presents as a follow-up for symptomatic cholelithiasis treated with antibiotics as an outpatient.  The patient has been asymptomatic since his visit to the emergency room.  Discussed with the patient that given this, there is no current urgency to take him to the operating room for cholecystectomy.  The patient has had some bad experiences with prior surgeries in the past and currently would want to defer surgery if possible.  I think based on his presentation, it is very reasonable and we will continue with watchful waiting.  Discussed with the patient the signs and symptoms to look out for including abdominal pain that is worsening, nausea, vomiting, fevers, or chills.  Also if he were to have more frequent right upper quadrant episodes he can always give [place] a call so we can discuss surgery in the future if needed.  Otherwise the patient will follow-up with [place] on an as-needed basis.  Patient understands this plan and all of his questions have been answered.  Face-to-face time spent with the patient and care providers was 25 minutes, with more than 50% of the time spent counseling, educating, and coordinating care of the patient.     Melvyn Neth, [place]

## 2017-03-11 NOTE — Patient Instructions (Signed)
We have seen you today for Gallstones and discuss possibilities for future surgery. If you have any questions or concerns please give our office a call.    Cholelithiasis Cholelithiasis is a form of gallbladder disease in which gallstones form in the gallbladder. The gallbladder is an organ that stores bile. Bile is made in the liver, and it helps to digest fats. Gallstones begin as small crystals and slowly grow into stones. They may cause no symptoms until the gallbladder tightens (contracts) and a gallstone is blocking the duct (gallbladder attack), which can cause pain. Cholelithiasis is also referred to as gallstones. There are two main types of gallstones:  Cholesterol stones. These are made of hardened cholesterol and are usually yellow-green in color. They are the most common type of gallstone. Cholesterol is a white, waxy, fat-like substance that is made in the liver.  Pigment stones. These are dark in color and are made of a red-yellow substance that forms when hemoglobin from red blood cells breaks down (bilirubin).  What are the causes? This condition may be caused by an imbalance in the substances that bile is made of. This can happen if the bile:  Has too much bilirubin.  Has too much cholesterol.  Does not have enough bile salts. These salts help the body absorb and digest fats.  In some cases, this condition can also be caused by the gallbladder not emptying completely or often enough. What increases the risk? The following factors may make you more likely to develop this condition:  Being male.  Having multiple pregnancies. Health care providers sometimes advise removing diseased gallbladders before future pregnancies.  Eating a diet that is heavy in fried foods, fat, and refined carbohydrates, like white bread and white rice.  Being obese.  Being older than age 16.  Prolonged use of medicines that contain male hormones (estrogen).  Having diabetes  mellitus.  Rapidly losing weight.  Having a family history of gallstones.  Being of [place] or [place] descent.  Having an intestinal disease such as Crohn disease.  Having metabolic syndrome.  Having cirrhosis.  Having severe types of anemia such as sickle cell anemia.  What are the signs or symptoms? In most cases, there are no symptoms. These are known as silent gallstones. If a gallstone blocks the bile ducts, it can cause a gallbladder attack. The main symptom of a gallbladder attack is sudden pain in the upper right abdomen. The pain usually comes at night or after eating a large meal. The pain can last for one or several hours and can spread to the right shoulder or chest. If the bile duct is blocked for more than a few hours, it can cause infection or inflammation of the gallbladder, liver, or pancreas, which may cause:  Nausea.  Vomiting.  Abdominal pain that lasts for 5 hours or more.  Fever or chills.  Yellowing of the skin or the whites of the eyes (jaundice).  Dark urine.  Light-colored stools.  How is this diagnosed? This condition may be diagnosed based on:  A physical exam.  Your medical history.  An ultrasound of your gallbladder.  CT scan.  MRI.  Blood tests to check for signs of infection or inflammation.  A scan of your gallbladder and bile ducts (biliary system) using nonharmful radioactive material and special cameras that can see the radioactive material (cholescintigram). This test checks to see how your gallbladder contracts and whether bile ducts are blocked.  Inserting a small tube with a camera  on the end (endoscope) through your mouth to inspect bile ducts and check for blockages (endoscopic retrograde cholangiopancreatogram).  How is this treated? Treatment for gallstones depends on the severity of the condition. Silent gallstones do not need treatment. If the gallstones cause a gallbladder attack or other symptoms, treatment  may be required. Options for treatment include:  Surgery to remove the gallbladder (cholecystectomy). This is the most common treatment.  Medicines to dissolve gallstones. These are most effective at treating small gallstones. You may need to take medicines for up to 6-12 months.  Shock wave treatment (extracorporeal biliary lithotripsy). In this treatment, an ultrasound machine sends shock waves to the gallbladder to break gallstones into smaller pieces. These pieces can then be passed into the intestines or be dissolved by medicine. This is rarely used.  Removing gallstones through endoscopic retrograde cholangiopancreatogram. A small basket can be attached to the endoscope and used to capture and remove gallstones.  Follow these instructions at home:  Take over-the-counter and prescription medicines only as told by your health care provider.  Maintain a healthy weight and follow a healthy diet. This includes: ? Reducing fatty foods, such as fried food. ? Reducing refined carbohydrates, like white bread and white rice. ? Increasing fiber. Aim for foods like almonds, fruit, and beans.  Keep all follow-up visits as told by your health care provider. This is important. Contact a health care provider if:  You think you have had a gallbladder attack.  You have been diagnosed with silent gallstones and you develop abdominal pain or indigestion. Get help right away if:  You have pain from a gallbladder attack that lasts for more than 2 hours.  You have abdominal pain that lasts for more than 5 hours.  You have a fever or chills.  You have persistent nausea and vomiting.  You develop jaundice.  You have dark urine or light-colored stools. Summary  Cholelithiasis (also called gallstones) is a form of gallbladder disease in which gallstones form in the gallbladder.  This condition is caused by an imbalance in the substances that make up bile. This can happen if the bile has too much  cholesterol, too much bilirubin, or not enough bile salts.  You are more likely to develop this condition if you are male, pregnant, using medicines with estrogen, obese, older than age 76, or have a family history of gallstones. You may also develop gallstones if you have diabetes, an intestinal disease, cirrhosis, or metabolic syndrome.  Treatment for gallstones depends on the severity of the condition. Silent gallstones do not need treatment.  If gallstones cause a gallbladder attack or other symptoms, treatment may be needed. The most common treatment is surgery to remove the gallbladder. This information is not intended to replace advice given to you by your health care provider. Make sure you discuss any questions you have with your health care provider. Document Released: 02/15/2005 Document Revised: 11/06/2015 Document Reviewed: 11/06/2015 Elsevier Interactive Patient Education  2017 [employer].

## 2017-03-25 ENCOUNTER — Other Ambulatory Visit: Payer: Self-pay | Admitting: Internal Medicine

## 2017-03-25 DIAGNOSIS — R1084 Generalized abdominal pain: Secondary | ICD-10-CM

## 2017-03-27 ENCOUNTER — Inpatient Hospital Stay: Payer: Medicare Other | Attending: Radiation Oncology | Admitting: *Deleted

## 2017-03-27 DIAGNOSIS — C61 Malignant neoplasm of prostate: Secondary | ICD-10-CM | POA: Diagnosis present

## 2017-03-28 LAB — PSA: PROSTATIC SPECIFIC ANTIGEN: 0.08 ng/mL (ref 0.00–4.00)

## 2017-04-03 ENCOUNTER — Ambulatory Visit
Admission: RE | Admit: 2017-04-03 | Discharge: 2017-04-03 | Disposition: A | Discharge status: Home / Self Care | Payer: Medicare Other | Source: Ambulatory Visit | Attending: Radiation Oncology | Admitting: Radiation Oncology

## 2017-04-03 ENCOUNTER — Other Ambulatory Visit: Payer: Self-pay

## 2017-04-03 ENCOUNTER — Encounter: Payer: Self-pay | Admitting: Radiation Oncology

## 2017-04-03 VITALS — BP 187/88 | HR 72 | Temp 97.8°F | Wt 183.3 lb

## 2017-04-03 DIAGNOSIS — R32 Unspecified urinary incontinence: Secondary | ICD-10-CM | POA: Insufficient documentation

## 2017-04-03 DIAGNOSIS — Z923 Personal history of irradiation: Secondary | ICD-10-CM | POA: Diagnosis not present

## 2017-04-03 DIAGNOSIS — C61 Malignant neoplasm of prostate: Secondary | ICD-10-CM | POA: Insufficient documentation

## 2017-04-03 NOTE — Progress Notes (Signed)
Radiation Oncology Follow up Note  Name: Jimmy Mcintyre   Date:   04/03/2017 MRN:  [id number] DOB: 1945-07-18    This 72 y.o. male presents to the clinic today for 2-year follow-up status post IM RT radiation therapy for stage II adenocarcinoma the prostate.  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  He is seen today in routine follow-up is doing well he may need gallbladder surgery and that is being investigated.  He does have some slight urinary incontinence and does wear a pad.  His most current PSA is 0.08.  He is having no diarrhea or other GI symptoms..  COMPLICATIONS OF TREATMENT: none  FOLLOW UP COMPLIANCE: keeps appointments   PHYSICAL EXAM:  BP (!) 187/88   Pulse 72   Temp 97.8 F (36.6 C)   Wt 183 lb 5 oz (83.2 kg)   BMI 27.07 kg/m  Well-developed well-nourished patient in NAD. HEENT reveals PERLA, EOMI, discs not visualized.  Oral cavity is clear. No oral mucosal lesions are identified. Neck is clear without evidence of cervical or supraclavicular adenopathy. Lungs are clear to A&P. Cardiac examination is essentially unremarkable with regular rate and rhythm without murmur rub or thrill. Abdomen is benign with no organomegaly or masses noted. Motor sensory and DTR levels are equal and symmetric in the upper and lower extremities. Cranial nerves II through XII are grossly intact. Proprioception is intact. No peripheral adenopathy or edema is identified. No motor or sensory levels are noted. Crude visual fields are within normal range.  RADIOLOGY RESULTS: No current films for review  PLAN: At the present time I suggested [place] exercises which he will review online.  He otherwise is doing well.  I have asked to see him back in 1 year for follow-up with a PSA at that time.  He is under excellent  biochemical control of his prostate cancer.  Patient knows to call sooner with any concerns.  I would like to take this opportunity to thank you for allowing me to participate in the care of your patient.Noreene Filbert, MD

## 2017-04-04 ENCOUNTER — Ambulatory Visit
Admission: RE | Admit: 2017-04-04 | Discharge: 2017-04-04 | Disposition: A | Discharge status: Home / Self Care | Payer: Medicare Other | Source: Ambulatory Visit | Attending: Internal Medicine | Admitting: Internal Medicine

## 2017-04-04 DIAGNOSIS — R1084 Generalized abdominal pain: Secondary | ICD-10-CM | POA: Insufficient documentation

## 2017-04-04 DIAGNOSIS — K4020 Bilateral inguinal hernia, without obstruction or gangrene, not specified as recurrent: Secondary | ICD-10-CM | POA: Insufficient documentation

## 2017-04-04 MED ORDER — IOPAMIDOL (ISOVUE-300) INJECTION 61%
100.0000 mL | Freq: Once | INTRAVENOUS | Status: AC | PRN
  Administered 2017-04-04: 100 mL via INTRAVENOUS

## 2017-05-10 ENCOUNTER — Ambulatory Visit: Payer: Self-pay | Admitting: Podiatry

## 2017-10-09 ENCOUNTER — Encounter: Payer: Self-pay | Admitting: *Deleted

## 2017-11-07 DIAGNOSIS — E349 Endocrine disorder, unspecified: Secondary | ICD-10-CM | POA: Insufficient documentation

## 2018-04-03 ENCOUNTER — Inpatient Hospital Stay: Payer: Medicare Other | Attending: Radiation Oncology | Admitting: *Deleted

## 2018-04-03 DIAGNOSIS — C61 Malignant neoplasm of prostate: Secondary | ICD-10-CM | POA: Diagnosis present

## 2018-04-03 LAB — PSA: Prostatic Specific Antigen: 0.08 ng/mL (ref 0.00–4.00)

## 2018-04-10 ENCOUNTER — Ambulatory Visit: Payer: Medicare Other | Admitting: Radiation Oncology

## 2018-04-16 ENCOUNTER — Encounter: Payer: Self-pay | Admitting: Radiation Oncology

## 2018-04-16 ENCOUNTER — Ambulatory Visit
Admission: RE | Admit: 2018-04-16 | Discharge: 2018-04-16 | Disposition: A | Discharge status: Home / Self Care | Payer: Medicare Other | Source: Ambulatory Visit | Attending: Radiation Oncology | Admitting: Radiation Oncology

## 2018-04-16 ENCOUNTER — Other Ambulatory Visit: Payer: Self-pay

## 2018-04-16 VITALS — BP 151/79 | HR 62 | Temp 96.8°F | Resp 18 | Wt 190.3 lb

## 2018-04-16 DIAGNOSIS — Z923 Personal history of irradiation: Secondary | ICD-10-CM | POA: Insufficient documentation

## 2018-04-16 DIAGNOSIS — C61 Malignant neoplasm of prostate: Secondary | ICD-10-CM | POA: Diagnosis present

## 2018-04-16 NOTE — Progress Notes (Signed)
Radiation Oncology Follow up Note  Name: Jimmy Mcintyre   Date:   04/16/2018 MRN:  [id number] DOB: [birth year]    This 73 y.o. male presents to the clinic today for 3 year follow-up status post IM RT radiation therapy for stage II adenocarcinoma the prostate.  REFERRING PROVIDER: Idelle Crouch, MD  HPI: patient is a 73 year old male now out 3 years having completed IM RT for a Gleason 7 (3+4) adenocarcinoma the prostate presenting the PSA of 7.7. He is seen today in routine follow-up and is doing well. He specifically denies any increased lower urinary tract symptoms or diarrhea. His most recent PSA is actually stable at 0.08 from a year ago.  COMPLICATIONS OF TREATMENT: none  FOLLOW UP COMPLIANCE: keeps appointments   PHYSICAL EXAM:  BP (!) 151/79 (BP Location: Left Arm, Patient Position: Sitting)   Pulse 62   Temp (!) 96.8 F (36 C) (Tympanic)   Resp 18   Wt 190 lb 4.1 oz (86.3 kg)   BMI 28.10 kg/m  Well-developed well-nourished patient in NAD. HEENT reveals PERLA, EOMI, discs not visualized.  Oral cavity is clear. No oral mucosal lesions are identified. Neck is clear without evidence of cervical or supraclavicular adenopathy. Lungs are clear to A&P. Cardiac examination is essentially unremarkable with regular rate and rhythm without murmur rub or thrill. Abdomen is benign with no organomegaly or masses noted. Motor sensory and DTR levels are equal and symmetric in the upper and lower extremities. Cranial nerves II through XII are grossly intact. Proprioception is intact. No peripheral adenopathy or edema is identified. No motor or sensory levels are noted. Crude visual fields are within normal range.  RADIOLOGY RESULTS: no current films for review  PLAN: present time patient is doing well under excellent biochemical control of his prostate cancer. I am please was overall progress. I've asked to see him back in 1 year for follow-up with a piece PSA prior to that visit. Patient is to  call with any concerns.  I would like to take this opportunity to thank you for allowing me to participate in the care of your patient.Noreene Filbert, MD

## 2019-03-17 DIAGNOSIS — F5104 Psychophysiologic insomnia: Secondary | ICD-10-CM | POA: Insufficient documentation

## 2019-03-25 IMAGING — US US ABDOMEN LIMITED
1 series · 14 of 25 positions shown · non-contrast
Comparison: CT abdomen 02/23/2014

CLINICAL DATA: Right upper quadrant abdominal pain for 2 days

EXAM:
ULTRASOUND ABDOMEN LIMITED RIGHT UPPER QUADRANT

[Series 1: us abdomen limited · 0.21mm/px · 14 of 67 slices shown]
[im 1/67]
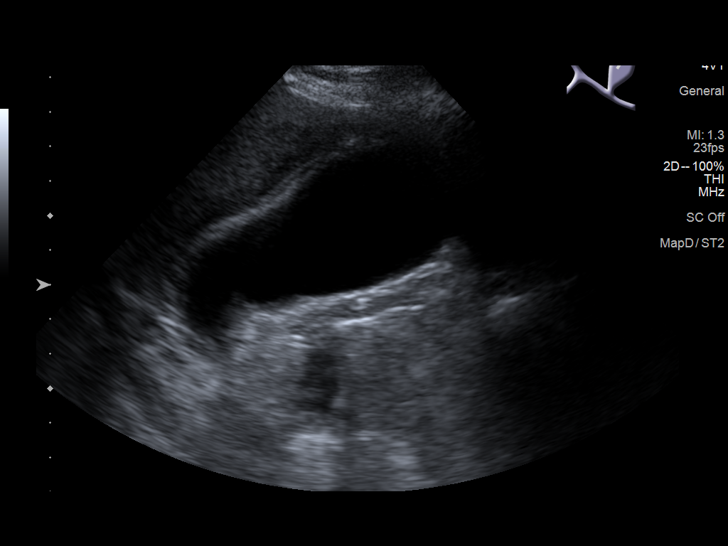
[im 6/67]
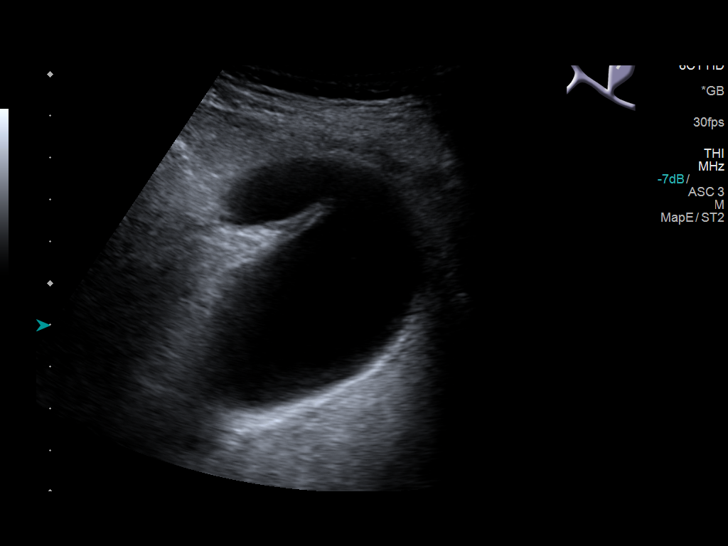
[im 12/67]
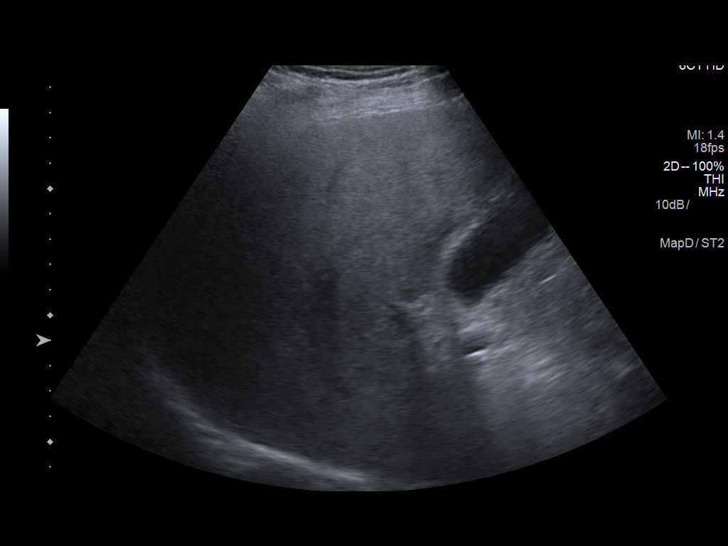
[im 17/67]
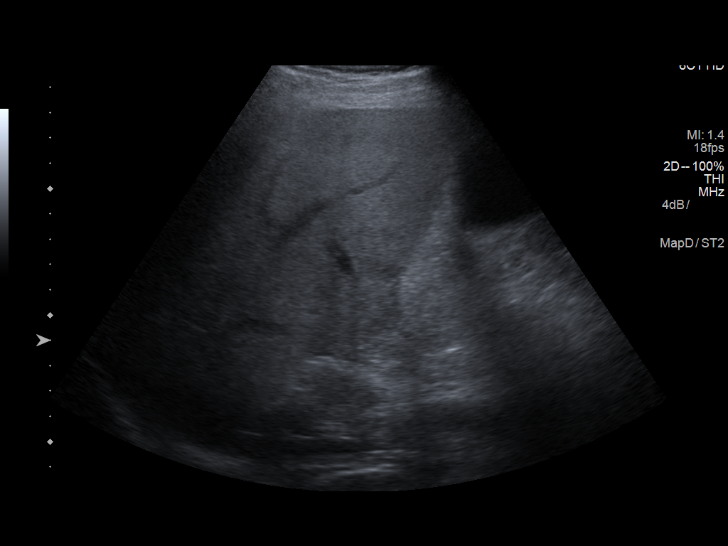
[im 23/67]
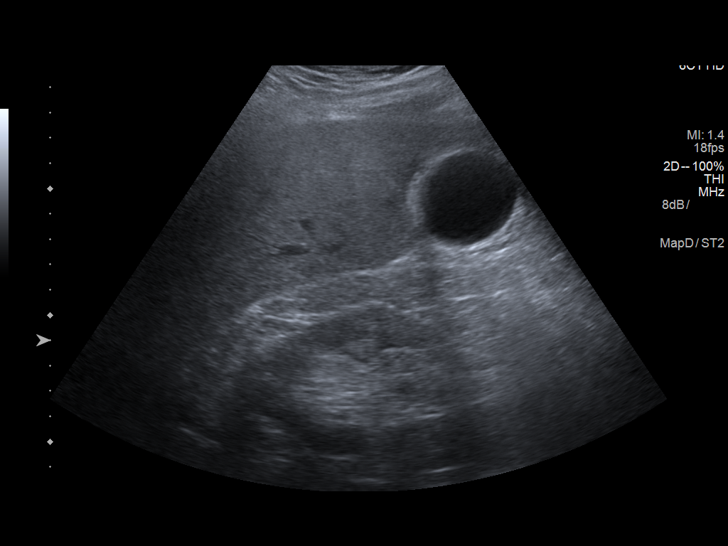
[im 25/67]
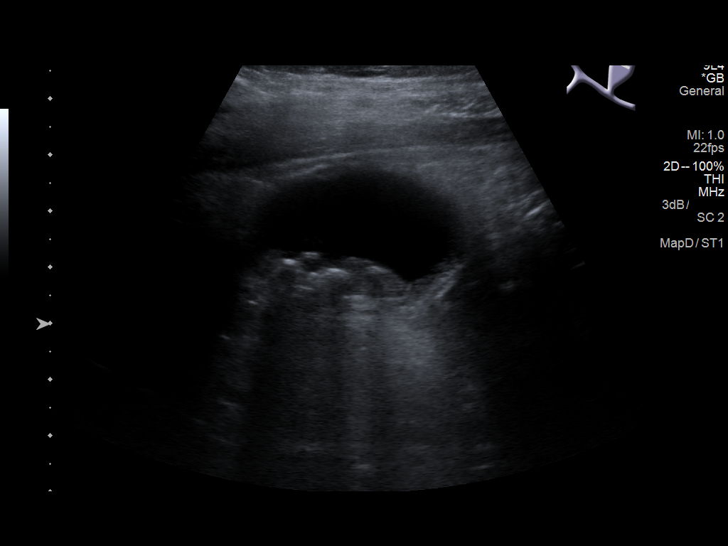
[im 31/67]
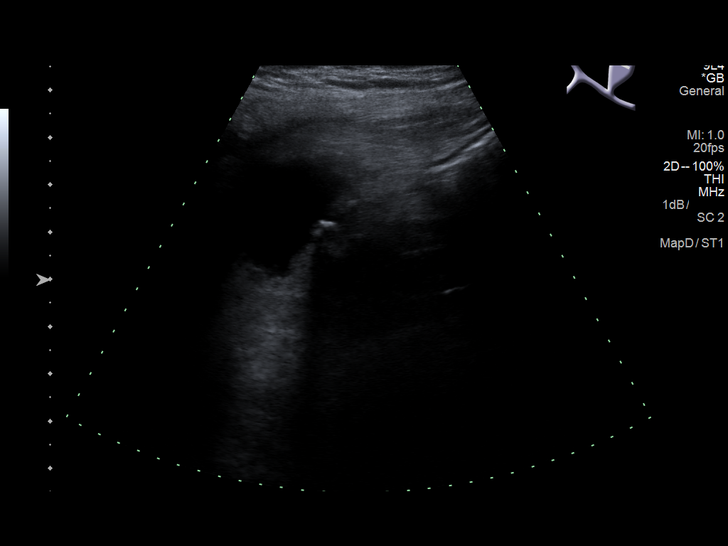
[im 36/67]
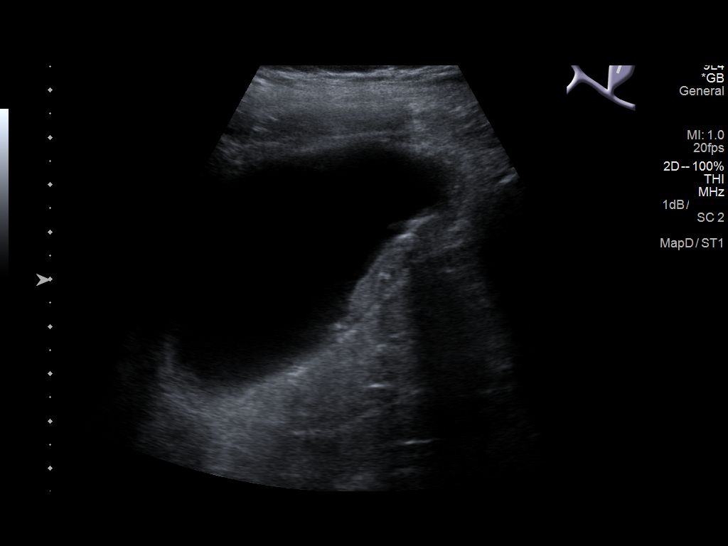
[im 42/67]
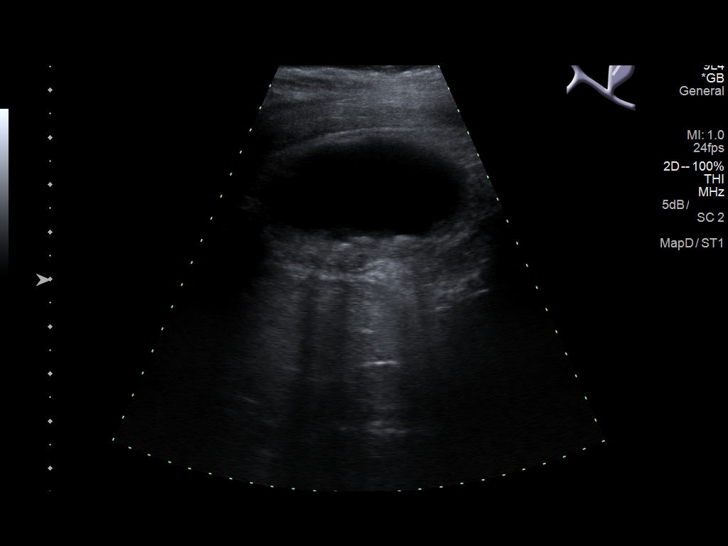
[im 45/67]
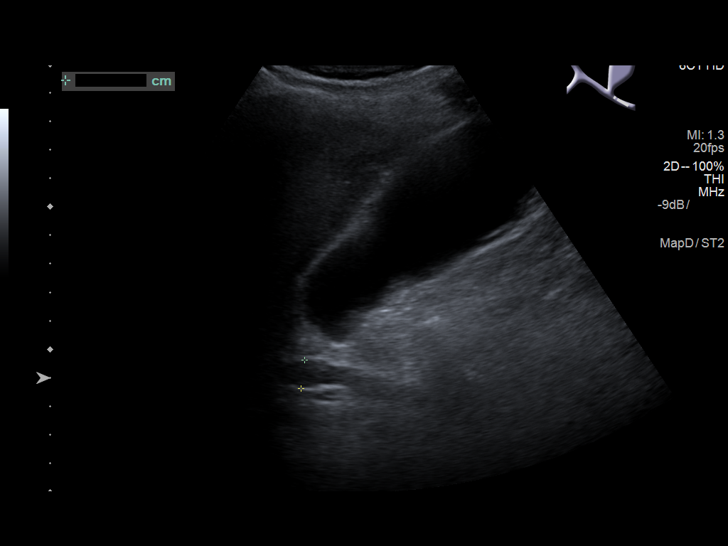
[im 50/67]
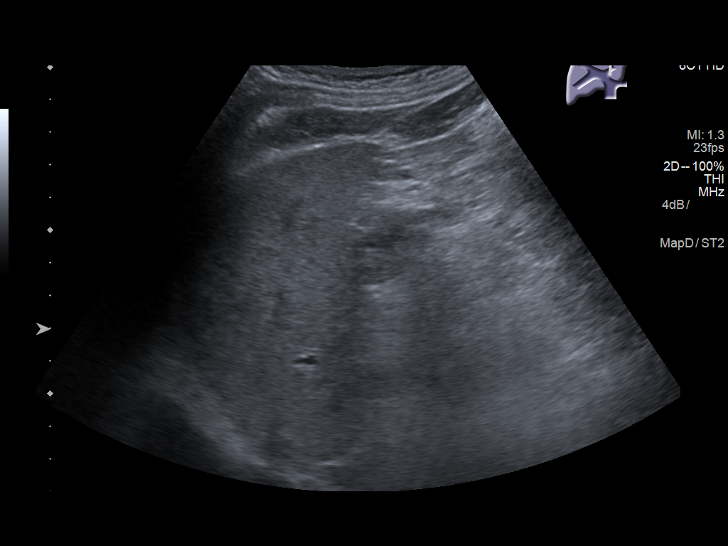
[im 56/67]
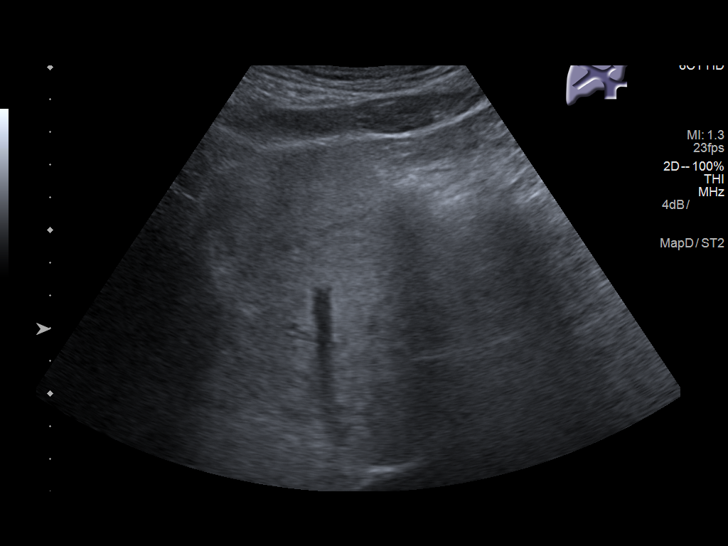
[im 61/67]
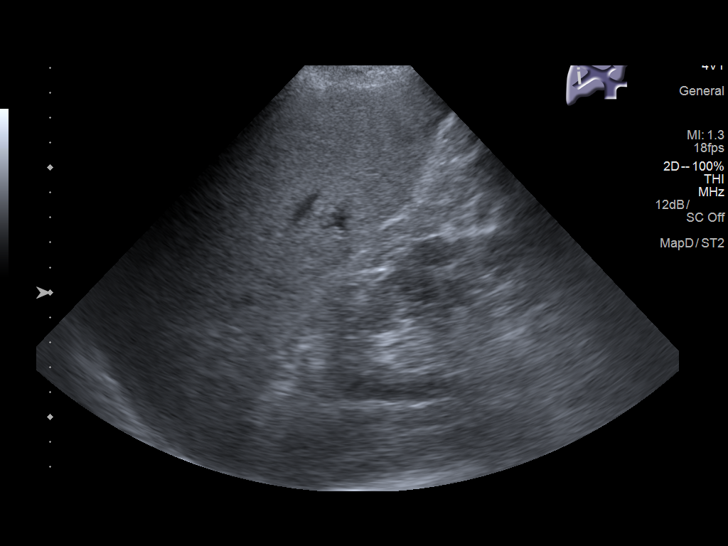
[im 67/67]
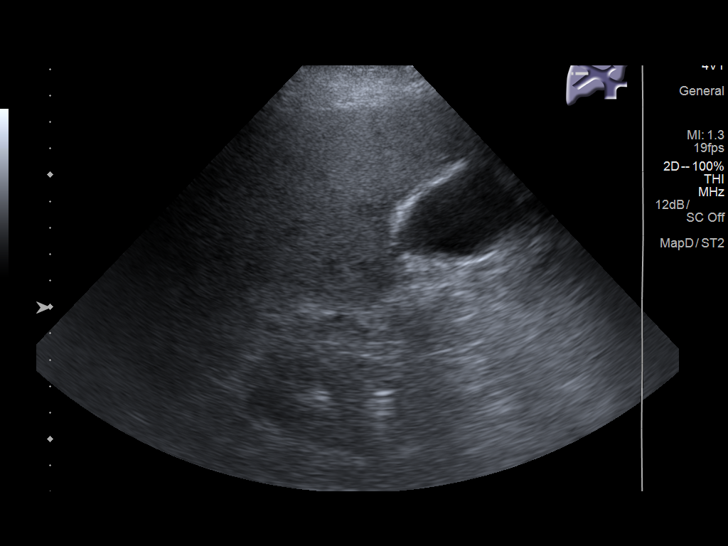

[14 of 25 positions shown; findings below may reference images not displayed]

FINDINGS: Gallbladder:

Sludge and stones are present in the gallbladder. A larger stone is
5 mm. Mild gallbladder wall thickening measures 4 mm. Negative
sonographic Murphy's sign.

Common bile duct:

Diameter: 10 mm

Liver:

The liver is diffusely increased in echogenicity without focal mass.
There is a focal area of hypoechoic signal with a geographic border
adjacent to the gallbladder most likely focal fatty sparing. This
can be seen on the prior study. See image 21 of series 7. Portal
vein is patent on color Doppler imaging with normal direction of
blood flow towards the liver.
IMPRESSION: Cholelithiasis and gallbladder sludge. There is nonspecific
gallbladder wall thickening.

The common bile duct is dilated suggesting biliary obstruction. MRCP
or ERCP is recommended to further characterize.

Diffuse hepatic steatosis with relative sparing adjacent to the
gallbladder.

## 2019-04-16 ENCOUNTER — Other Ambulatory Visit: Payer: Self-pay

## 2019-04-16 ENCOUNTER — Inpatient Hospital Stay: Payer: Medicare Other | Attending: Radiation Oncology

## 2019-04-16 DIAGNOSIS — C61 Malignant neoplasm of prostate: Secondary | ICD-10-CM | POA: Insufficient documentation

## 2019-04-16 LAB — PSA: Prostatic Specific Antigen: 0.03 ng/mL (ref 0.00–4.00)

## 2019-04-23 ENCOUNTER — Ambulatory Visit: Payer: Medicare Other | Admitting: Radiation Oncology

## 2019-05-12 ENCOUNTER — Other Ambulatory Visit: Payer: Self-pay

## 2019-05-13 ENCOUNTER — Ambulatory Visit
Admission: RE | Admit: 2019-05-13 | Discharge: 2019-05-13 | Disposition: A | Discharge status: Home / Self Care | Payer: Medicare Other | Source: Ambulatory Visit | Attending: Radiation Oncology | Admitting: Radiation Oncology

## 2019-05-13 ENCOUNTER — Other Ambulatory Visit: Payer: Self-pay

## 2019-05-13 VITALS — BP 149/84 | HR 62 | Temp 97.7°F | Resp 16 | Wt 189.1 lb

## 2019-05-13 DIAGNOSIS — C61 Malignant neoplasm of prostate: Secondary | ICD-10-CM

## 2019-05-13 NOTE — Progress Notes (Signed)
Radiation Oncology Follow up Note  Name: Jimmy Mcintyre   Date:   05/13/2019 MRN:  [id number] DOB: October 22, 1945    This 74 y.o. male presents to the clinic today for 4-year follow-up status post IMRT radiation therapy for stage II adenocarcinoma the prostate.  REFERRING PROVIDER: Idelle Crouch, MD  HPI: Patient is a 74 year old male now out 4 years having completed IMRT radiation therapy for Gleason 7 (3+4) adenocarcinoma the prostate presenting with a PSA of 7.7.  Seen today in routine follow-up he is doing well specifically denies any increased lower urinary tract symptoms diarrhea or fatigue.Marland Kitchen  His most recent PSA from 3 weeks ago was 0.03 down from 0.081-year prior.  COMPLICATIONS OF TREATMENT: none  FOLLOW UP COMPLIANCE: keeps appointments   PHYSICAL EXAM:  BP (!) 149/84   Pulse 62   Temp 97.7 F (36.5 C)   Resp 16   Wt 189 lb 1.6 oz (85.8 kg)   SpO2 97%   BMI 27.93 kg/m  Well-developed well-nourished patient in NAD. HEENT reveals PERLA, EOMI, discs not visualized.  Oral cavity is clear. No oral mucosal lesions are identified. Neck is clear without evidence of cervical or supraclavicular adenopathy. Lungs are clear to A&P. Cardiac examination is essentially unremarkable with regular rate and rhythm without murmur rub or thrill. Abdomen is benign with no organomegaly or masses noted. Motor sensory and DTR levels are equal and symmetric in the upper and lower extremities. Cranial nerves II through XII are grossly intact. Proprioception is intact. No peripheral adenopathy or edema is identified. No motor or sensory levels are noted. Crude visual fields are within normal range.  RADIOLOGY RESULTS: No current films to review  PLAN: Present time is now 4 years out under excellent biochemical control of his prostate cancer.  And pleased with his overall progress and side effect profile.  I have asked to see him back in 1 year for follow-up and then will discontinue follow-up care.  Patient  knows to call with any concerns at any time.  I would like to take this opportunity to thank you for allowing me to participate in the care of your patient.Noreene Filbert, MD

## 2019-06-30 ENCOUNTER — Other Ambulatory Visit (HOSPITAL_COMMUNITY): Payer: Self-pay | Admitting: Physical Medicine & Rehabilitation

## 2019-06-30 ENCOUNTER — Other Ambulatory Visit: Payer: Self-pay | Admitting: Physical Medicine & Rehabilitation

## 2019-06-30 DIAGNOSIS — M5416 Radiculopathy, lumbar region: Secondary | ICD-10-CM

## 2019-07-15 ENCOUNTER — Other Ambulatory Visit: Payer: Self-pay

## 2019-07-15 ENCOUNTER — Ambulatory Visit
Admission: RE | Admit: 2019-07-15 | Discharge: 2019-07-15 | Disposition: A | Discharge status: Home / Self Care | Payer: Medicare Other | Source: Ambulatory Visit | Attending: Physical Medicine & Rehabilitation | Admitting: Physical Medicine & Rehabilitation

## 2019-07-15 DIAGNOSIS — M5416 Radiculopathy, lumbar region: Secondary | ICD-10-CM | POA: Diagnosis not present

## 2019-07-21 DIAGNOSIS — M48061 Spinal stenosis, lumbar region without neurogenic claudication: Secondary | ICD-10-CM | POA: Insufficient documentation

## 2019-07-21 DIAGNOSIS — M5442 Lumbago with sciatica, left side: Secondary | ICD-10-CM | POA: Insufficient documentation

## 2019-07-21 DIAGNOSIS — G8929 Other chronic pain: Secondary | ICD-10-CM | POA: Insufficient documentation

## 2019-12-30 DIAGNOSIS — M19012 Primary osteoarthritis, left shoulder: Secondary | ICD-10-CM | POA: Insufficient documentation

## 2020-01-04 NOTE — Progress Notes (Signed)
DUE TO COVID-19 ONLY ONE VISITOR IS ALLOWED TO COME WITH YOU AND STAY IN THE WAITING ROOM ONLY DURING PRE OP AND PROCEDURE DAY OF SURGERY. THE 1 VISITOR  MAY VISIT WITH YOU AFTER SURGERY IN YOUR PRIVATE ROOM DURING VISITING HOURS ONLY!  YOU NEED TO HAVE A COVID 19 TEST ON___11/10/2019 ____ @_______ , THIS TEST MUST BE DONE BEFORE SURGERY,  COVID TESTING SITE [address] [place] [id number], IT IS ON THE RIGHT GOING OUT [address] APPROXIMATELY  2 MINUTES PAST [employer] ON THE RIGHT. ONCE YOUR COVID TEST IS COMPLETED,  PLEASE BEGIN THE QUARANTINE INSTRUCTIONS AS OUTLINED IN YOUR HANDOUT.                Jimmy Mcintyre  01/04/2020   Your procedure is scheduled on: 01/14/2020    Report to [facility] Main  Entrance   Report to admitting at  1000 AM     Call this number if you have problems the morning of surgery [phone]    Remember: Do not eat food , candy gum or mints :After Midnight. You may have clear liquids from midnight until 0930 am    CLEAR LIQUID DIET   Foods Allowed                                                                       Coffee and tea, regular and decaf                              Plain Jell-O any favor except red or purple                                            Fruit ices (not with fruit pulp)                                      Iced Popsicles                                     Carbonated beverages, regular and diet                                    Cranberry, grape and apple juices Sports drinks like Gatorade Lightly seasoned clear broth or consume(fat free) Sugar, honey syrup   _____________________________________________________________________    BRUSH YOUR TEETH MORNING OF SURGERY AND RINSE YOUR MOUTH OUT, NO CHEWING GUM CANDY OR MINTS.     Take these medicines the morning of surgery with A SIP OF WATER:  Prilosec, metoprolol, allopurinol, proscar  DO NOT TAKE ANY DIABETIC MEDICATIONS DAY OF YOUR  SURGERY                               You may not have any metal on your  body including hair pins and              piercings  Do not wear jewelry, make-up, lotions, powders or perfumes, deodorant             Do not wear nail polish on your fingernails.  Do not shave  48 hours prior to surgery.              Men may shave face and neck.   Do not bring valuables to the hospital. [place].  Contacts, dentures or bridgework may not be worn into surgery.  Leave suitcase in the car. After surgery it may be brought to your room.     Patients discharged the day of surgery will not be allowed to drive home. IF YOU ARE HAVING SURGERY AND GOING HOME THE SAME DAY, YOU MUST HAVE AN ADULT TO DRIVE YOU HOME AND BE WITH YOU FOR 24 HOURS. YOU MAY GO HOME BY TAXI OR UBER OR ORTHERWISE, BUT AN ADULT MUST ACCOMPANY YOU HOME AND STAY WITH YOU FOR 24 HOURS.  Name and phone number of your driver:  Special Instructions: N/A              Please read over the following fact sheets you were given: _____________________________________________________________________  [facility] - Preparing for Surgery Before surgery, you can play an important role.  Because skin is not sterile, your skin needs to be as free of germs as possible.  You can reduce the number of germs on your skin by washing with CHG (chlorahexidine gluconate) soap before surgery.  CHG is an antiseptic cleaner which kills germs and bonds with the skin to continue killing germs even after washing. Please DO NOT use if you have an allergy to CHG or antibacterial soaps.  If your skin becomes reddened/irritated stop using the CHG and inform your nurse when you arrive at Short Stay. Do not shave (including legs and underarms) for at least 48 hours prior to the first CHG shower.  You may shave your face/neck. Please follow these instructions carefully:  1.  Shower with CHG Soap the night before surgery and the   morning of Surgery.  2.  If you choose to wash your hair, wash your hair first as usual with your  normal  shampoo.  3.  After you shampoo, rinse your hair and body thoroughly to remove the  shampoo.                           4.  Use CHG as you would any other liquid soap.  You can apply chg directly  to the skin and wash                       Gently with a scrungie or clean washcloth.  5.  Apply the CHG Soap to your body ONLY FROM THE NECK DOWN.   Do not use on face/ open                           Wound or open sores. Avoid contact with eyes, ears mouth and genitals (private parts).                       Wash face,  Development worker, international aid (private  parts) with your normal soap.             6.  Wash thoroughly, paying special attention to the area where your surgery  will be performed.  7.  Thoroughly rinse your body with warm water from the neck down.  8.  DO NOT shower/wash with your normal soap after using and rinsing off  the CHG Soap.                9.  Pat yourself dry with a clean towel.            10.  Wear clean pajamas.            11.  Place clean sheets on your bed the night of your first shower and do not  sleep with pets. Day of Surgery : Do not apply any lotions/deodorants the morning of surgery.  Please wear clean clothes to the hospital/surgery center.  FAILURE TO FOLLOW THESE INSTRUCTIONS MAY RESULT IN THE CANCELLATION OF YOUR SURGERY PATIENT SIGNATURE_________________________________  NURSE SIGNATURE__________________________________  ________________________________________________________________________

## 2020-01-05 ENCOUNTER — Encounter (HOSPITAL_COMMUNITY): Payer: Self-pay

## 2020-01-05 ENCOUNTER — Encounter (HOSPITAL_COMMUNITY)
Admission: RE | Admit: 2020-01-05 | Discharge: 2020-01-05 | Disposition: A | Discharge status: Home / Self Care | Payer: Medicare Other | Source: Ambulatory Visit | Attending: Orthopedic Surgery | Admitting: Orthopedic Surgery

## 2020-01-05 ENCOUNTER — Other Ambulatory Visit: Payer: Self-pay

## 2020-01-05 DIAGNOSIS — Z01812 Encounter for preprocedural laboratory examination: Secondary | ICD-10-CM | POA: Insufficient documentation

## 2020-01-05 HISTORY — DX: Gastro-esophageal reflux disease without esophagitis: K21.9

## 2020-01-05 LAB — CBC
HCT: 46.1 % (ref 39.0–52.0)
Hemoglobin: 15 g/dL (ref 13.0–17.0)
MCH: 29.4 pg (ref 26.0–34.0)
MCHC: 32.5 g/dL (ref 30.0–36.0)
MCV: 90.2 fL (ref 80.0–100.0)
Platelets: 192 10*3/uL (ref 150–400)
RBC: 5.11 MIL/uL (ref 4.22–5.81)
RDW: 14.9 % (ref 11.5–15.5)
WBC: 7.5 10*3/uL (ref 4.0–10.5)
nRBC: 0 % (ref 0.0–0.2)

## 2020-01-05 LAB — BASIC METABOLIC PANEL
Anion gap: 8 (ref 5–15)
BUN: 29 mg/dL — ABNORMAL HIGH (ref 8–23)
CO2: 25 mmol/L (ref 22–32)
Calcium: 9.4 mg/dL (ref 8.9–10.3)
Chloride: 107 mmol/L (ref 98–111)
Creatinine, Ser: 1.19 mg/dL (ref 0.61–1.24)
GFR, Estimated: >60 mL/min (ref >60)
Glucose, Bld: 130 mg/dL — ABNORMAL HIGH (ref 70–99)
Potassium: 4.3 mmol/L (ref 3.5–5.1)
Sodium: 140 mmol/L (ref 135–145)

## 2020-01-05 LAB — GLUCOSE, CAPILLARY: Glucose-Capillary: 116 mg/dL — ABNORMAL HIGH (ref 70–99)

## 2020-01-05 LAB — SURGICAL PCR SCREEN
MRSA, PCR: NEGATIVE
Staphylococcus aureus: NEGATIVE

## 2020-01-05 LAB — HEMOGLOBIN A1C
Hgb A1c MFr Bld: 6.9 % — ABNORMAL HIGH (ref 4.8–5.6)
Mean Plasma Glucose: 151.33 mg/dL

## 2020-01-05 NOTE — Progress Notes (Addendum)
Anesthesia Review:  PCP: Cardiologist :DR Jordan Hawks LOV 12/24/19  Clearance dated 01/06/2020 on chart  Chest x-ray : EKG :12/2019- [facility]  Echo : Stress test: Cardiac Cath :  Activity level:  Can do a flight of stairs without difficulty  Sleep Study/ CPAP : no  Fasting Blood Sugar :      / Checks Blood Sugar -- times a day:   Blood Thinner/ Instructions /Last Dose: ASA / Instructions/ Last Dose : 325 mg Aspirin DM- type 2  Hx of cardiac stents  hgba1c-01/05/2020

## 2020-01-05 NOTE — Progress Notes (Signed)
NO SOLID FOOD AFTER MIDNIGHT THE NIGHT PRIOR TO SURGERY. NOTHING BY MOUTH EXCEPT CLEAR LIQUIDS UNTIL      0930AM . PLEASE FINISH g2 DRINK PER SURGEON ORDER  WHICH NEEDS TO BE COMPLETED AT .NO SOLID FOOD AFTER MIDNIGHT THE NIGHT PRIOR TO SURGERY. NOTHING BY MOUTH EXCEPT CLEAR LIQUIDS UNTIL . PLEASE FINISH g2    DRINK PER SURGEON ORDER  WHICH NEEDS TO BE COMPLETED AT .[facility]- Preparing for Total Shoulder Arthroplasty    Before surgery, you can play an important role. Because skin is not sterile, your skin needs to be as free of germs as possible. You can reduce the number of germs on your skin by using the following products. . Benzoyl Peroxide Gel o Reduces the number of germs present on the skin o Applied twice a day to shoulder area starting two days before surgery    ==================================================================  Please follow these instructions carefully:  BENZOYL PEROXIDE 5% GEL  Please do not use if you have an allergy to benzoyl peroxide.   If your skin becomes reddened/irritated stop using the benzoyl peroxide.  Starting two days before surgery, apply as follows: 1. Apply benzoyl peroxide in the morning and at night. Apply after taking a shower. If you are not taking a shower clean entire shoulder front, back, and side along with the armpit with a clean wet washcloth.  2. Place a quarter-sized dollop on your shoulder and rub in thoroughly, making sure to cover the front, back, and side of your shoulder, along with the armpit.   2 days before ____ AM   ____ PM              1 day before ____ AM   ____ PM                         3. Do this twice a day for two days.  (Last application is the night before surgery, AFTER using the CHG soap as described below).  4. Do NOT apply benzoyl peroxide gel on the day of surgery.

## 2020-01-11 ENCOUNTER — Other Ambulatory Visit (HOSPITAL_COMMUNITY)
Admission: RE | Admit: 2020-01-11 | Discharge: 2020-01-11 | Disposition: A | Discharge status: Home / Self Care | Payer: Medicare Other | Source: Ambulatory Visit | Attending: Orthopedic Surgery | Admitting: Orthopedic Surgery

## 2020-01-11 DIAGNOSIS — Z20822 Contact with and (suspected) exposure to covid-19: Secondary | ICD-10-CM | POA: Diagnosis not present

## 2020-01-11 DIAGNOSIS — Z01812 Encounter for preprocedural laboratory examination: Secondary | ICD-10-CM | POA: Diagnosis present

## 2020-01-11 LAB — SARS CORONAVIRUS 2 (TAT 6-24 HRS): SARS Coronavirus 2: NEGATIVE

## 2020-01-14 ENCOUNTER — Encounter (HOSPITAL_COMMUNITY): Payer: Self-pay | Admitting: Orthopedic Surgery

## 2020-01-14 ENCOUNTER — Ambulatory Visit (HOSPITAL_COMMUNITY): Payer: Medicare Other | Admitting: Physician Assistant

## 2020-01-14 ENCOUNTER — Encounter (HOSPITAL_COMMUNITY)
Admission: RE | Disposition: A | Discharge status: Home / Self Care | Payer: Self-pay | Source: Ambulatory Visit | Attending: Orthopedic Surgery

## 2020-01-14 ENCOUNTER — Ambulatory Visit (HOSPITAL_COMMUNITY): Payer: Medicare Other | Admitting: Certified Registered Nurse Anesthetist

## 2020-01-14 ENCOUNTER — Ambulatory Visit (HOSPITAL_COMMUNITY)
Admission: RE | Admit: 2020-01-14 | Discharge: 2020-01-14 | Disposition: A | Discharge status: Home / Self Care | Payer: Medicare Other | Source: Ambulatory Visit | Attending: Orthopedic Surgery | Admitting: Orthopedic Surgery

## 2020-01-14 DIAGNOSIS — Z79899 Other long term (current) drug therapy: Secondary | ICD-10-CM | POA: Insufficient documentation

## 2020-01-14 DIAGNOSIS — Z7984 Long term (current) use of oral hypoglycemic drugs: Secondary | ICD-10-CM | POA: Diagnosis not present

## 2020-01-14 DIAGNOSIS — Z955 Presence of coronary angioplasty implant and graft: Secondary | ICD-10-CM | POA: Insufficient documentation

## 2020-01-14 DIAGNOSIS — Z833 Family history of diabetes mellitus: Secondary | ICD-10-CM | POA: Diagnosis not present

## 2020-01-14 DIAGNOSIS — E119 Type 2 diabetes mellitus without complications: Secondary | ICD-10-CM | POA: Diagnosis not present

## 2020-01-14 DIAGNOSIS — I10 Essential (primary) hypertension: Secondary | ICD-10-CM | POA: Insufficient documentation

## 2020-01-14 DIAGNOSIS — M19012 Primary osteoarthritis, left shoulder: Secondary | ICD-10-CM | POA: Diagnosis not present

## 2020-01-14 DIAGNOSIS — M898X1 Other specified disorders of bone, shoulder: Secondary | ICD-10-CM | POA: Diagnosis not present

## 2020-01-14 DIAGNOSIS — Z7982 Long term (current) use of aspirin: Secondary | ICD-10-CM | POA: Insufficient documentation

## 2020-01-14 DIAGNOSIS — E7801 Familial hypercholesterolemia: Secondary | ICD-10-CM | POA: Insufficient documentation

## 2020-01-14 DIAGNOSIS — Z791 Long term (current) use of non-steroidal anti-inflammatories (NSAID): Secondary | ICD-10-CM | POA: Insufficient documentation

## 2020-01-14 DIAGNOSIS — E785 Hyperlipidemia, unspecified: Secondary | ICD-10-CM | POA: Insufficient documentation

## 2020-01-14 DIAGNOSIS — I2510 Atherosclerotic heart disease of native coronary artery without angina pectoris: Secondary | ICD-10-CM | POA: Insufficient documentation

## 2020-01-14 DIAGNOSIS — Z8249 Family history of ischemic heart disease and other diseases of the circulatory system: Secondary | ICD-10-CM | POA: Diagnosis not present

## 2020-01-14 DIAGNOSIS — K219 Gastro-esophageal reflux disease without esophagitis: Secondary | ICD-10-CM | POA: Diagnosis not present

## 2020-01-14 HISTORY — PX: REVERSE SHOULDER ARTHROPLASTY: SHX5054

## 2020-01-14 LAB — GLUCOSE, CAPILLARY
Glucose-Capillary: 143 mg/dL — ABNORMAL HIGH (ref 70–99)
Glucose-Capillary: 135 mg/dL — ABNORMAL HIGH (ref 70–99)

## 2020-01-14 SURGERY — ARTHROPLASTY, SHOULDER, TOTAL, REVERSE
Anesthesia: General | Site: Shoulder | Laterality: Left

## 2020-01-14 MED ORDER — LACTATED RINGERS IV BOLUS
250.0000 mL | Freq: Once | INTRAVENOUS | Status: AC
  Administered 2020-01-14: 250 mL via INTRAVENOUS

## 2020-01-14 MED ORDER — ONDANSETRON HCL 4 MG/2ML IJ SOLN
INTRAMUSCULAR | Status: DC | PRN
  Administered 2020-01-14: 4 mg via INTRAVENOUS

## 2020-01-14 MED ORDER — LACTATED RINGERS IV BOLUS
500.0000 mL | Freq: Once | INTRAVENOUS | Status: AC
  Administered 2020-01-14: 500 mL via INTRAVENOUS

## 2020-01-14 MED ORDER — PHENYLEPHRINE HCL-NACL 10-0.9 MG/250ML-% IV SOLN
INTRAVENOUS | Status: DC | PRN
  Administered 2020-01-14: 35 ug/min via INTRAVENOUS

## 2020-01-14 MED ORDER — CHLORHEXIDINE GLUCONATE 0.12 % MT SOLN
15.0000 mL | Freq: Once | OROMUCOSAL | Status: AC
  Administered 2020-01-14: 15 mL via OROMUCOSAL

## 2020-01-14 MED ORDER — PROPOFOL 10 MG/ML IV BOLUS
INTRAVENOUS | Status: DC | PRN
  Administered 2020-01-14: 160 mg via INTRAVENOUS

## 2020-01-14 MED ORDER — ONDANSETRON HCL 4 MG PO TABS: 4.0000 mg | ORAL_TABLET | Freq: Three times a day (TID) | ORAL | 0 refills | Status: DC | PRN

## 2020-01-14 MED ORDER — LACTATED RINGERS IV SOLN: INTRAVENOUS | Status: DC

## 2020-01-14 MED ORDER — FENTANYL CITRATE (PF) 100 MCG/2ML IJ SOLN
50.0000 ug | INTRAMUSCULAR | Status: DC
  Administered 2020-01-14: 100 ug via INTRAVENOUS
  Filled 2020-01-14: qty 2

## 2020-01-14 MED ORDER — ORAL CARE MOUTH RINSE: 15.0000 mL | Freq: Once | OROMUCOSAL | Status: AC

## 2020-01-14 MED ORDER — MIDAZOLAM HCL 2 MG/2ML IJ SOLN
1.0000 mg | INTRAMUSCULAR | Status: DC
  Administered 2020-01-14: 2 mg via INTRAVENOUS
  Filled 2020-01-14: qty 2

## 2020-01-14 MED ORDER — MELOXICAM 15 MG PO TABS
15.0000 mg | ORAL_TABLET | Freq: Every day | ORAL | 1 refills | Status: DC
Start: 2020-01-14 — End: 2020-06-03

## 2020-01-14 MED ORDER — ONDANSETRON HCL 4 MG/2ML IJ SOLN
INTRAMUSCULAR | Status: AC
  Filled 2020-01-14: qty 2

## 2020-01-14 MED ORDER — CYCLOBENZAPRINE HCL 10 MG PO TABS: 10.0000 mg | ORAL_TABLET | Freq: Three times a day (TID) | ORAL | 1 refills | Status: DC | PRN

## 2020-01-14 MED ORDER — ACETAMINOPHEN 500 MG PO TABS
1000.0000 mg | ORAL_TABLET | Freq: Once | ORAL | Status: AC
  Administered 2020-01-14: 1000 mg via ORAL
  Filled 2020-01-14: qty 2

## 2020-01-14 MED ORDER — FENTANYL CITRATE (PF) 100 MCG/2ML IJ SOLN
INTRAMUSCULAR | Status: AC
  Filled 2020-01-14: qty 2

## 2020-01-14 MED ORDER — EPHEDRINE SULFATE-NACL 50-0.9 MG/10ML-% IV SOSY
PREFILLED_SYRINGE | INTRAVENOUS | Status: DC | PRN
  Administered 2020-01-14 (×2): 10 mg via INTRAVENOUS

## 2020-01-14 MED ORDER — DEXAMETHASONE SODIUM PHOSPHATE 10 MG/ML IJ SOLN
INTRAMUSCULAR | Status: DC | PRN
  Administered 2020-01-14: 7 mg via INTRAVENOUS

## 2020-01-14 MED ORDER — DEXAMETHASONE SODIUM PHOSPHATE 10 MG/ML IJ SOLN
INTRAMUSCULAR | Status: AC
  Filled 2020-01-14: qty 1

## 2020-01-14 MED ORDER — PROPOFOL 10 MG/ML IV BOLUS
INTRAVENOUS | Status: AC
  Filled 2020-01-14: qty 20

## 2020-01-14 MED ORDER — OXYCODONE-ACETAMINOPHEN 5-325 MG PO TABS
1.0000 | ORAL_TABLET | ORAL | 0 refills | Status: DC | PRN
Start: 2020-01-14 — End: 2020-06-03

## 2020-01-14 MED ORDER — PROMETHAZINE HCL 25 MG/ML IJ SOLN: 6.2500 mg | INTRAMUSCULAR | Status: DC | PRN

## 2020-01-14 MED ORDER — BUPIVACAINE LIPOSOME 1.3 % IJ SUSP
INTRAMUSCULAR | Status: DC | PRN
  Administered 2020-01-14: 10 mL via PERINEURAL

## 2020-01-14 MED ORDER — CEFAZOLIN SODIUM-DEXTROSE 2-4 GM/100ML-% IV SOLN
2.0000 g | INTRAVENOUS | Status: AC
  Administered 2020-01-14: 2 g via INTRAVENOUS
  Filled 2020-01-14: qty 100

## 2020-01-14 MED ORDER — 0.9 % SODIUM CHLORIDE (POUR BTL) OPTIME
TOPICAL | Status: DC | PRN
  Administered 2020-01-14: 1000 mL

## 2020-01-14 MED ORDER — ROCURONIUM BROMIDE 10 MG/ML (PF) SYRINGE
PREFILLED_SYRINGE | INTRAVENOUS | Status: AC
  Filled 2020-01-14: qty 10

## 2020-01-14 MED ORDER — STERILE WATER FOR IRRIGATION IR SOLN
Status: DC | PRN
  Administered 2020-01-14: 2000 mL

## 2020-01-14 MED ORDER — BUPIVACAINE HCL (PF) 0.5 % IJ SOLN
INTRAMUSCULAR | Status: DC | PRN
  Administered 2020-01-14: 15 mL via PERINEURAL

## 2020-01-14 MED ORDER — ROCURONIUM BROMIDE 10 MG/ML (PF) SYRINGE
PREFILLED_SYRINGE | INTRAVENOUS | Status: DC | PRN
  Administered 2020-01-14: 80 mg via INTRAVENOUS

## 2020-01-14 MED ORDER — SUGAMMADEX SODIUM 200 MG/2ML IV SOLN
INTRAVENOUS | Status: DC | PRN
  Administered 2020-01-14: 200 mg via INTRAVENOUS

## 2020-01-14 MED ORDER — TRANEXAMIC ACID-NACL 1000-0.7 MG/100ML-% IV SOLN
1000.0000 mg | INTRAVENOUS | Status: AC
  Administered 2020-01-14: 1000 mg via INTRAVENOUS
  Filled 2020-01-14: qty 100

## 2020-01-14 MED ORDER — FENTANYL CITRATE (PF) 100 MCG/2ML IJ SOLN
INTRAMUSCULAR | Status: DC | PRN
  Administered 2020-01-14: 50 ug via INTRAVENOUS

## 2020-01-14 MED ORDER — FENTANYL CITRATE (PF) 100 MCG/2ML IJ SOLN
25.0000 ug | INTRAMUSCULAR | Status: DC | PRN
  Administered 2020-01-14: 50 ug via INTRAVENOUS

## 2020-01-14 SURGICAL SUPPLY — 66 items
BAG ZIPLOCK 12X15 (MISCELLANEOUS) ×3 IMPLANT
BASEPLATE MOD UNI REV 28 +2 (Shoulder) ×3 IMPLANT
BLADE SAW SGTL 83.5X18.5 (BLADE) ×3 IMPLANT
COOLER ICEMAN CLASSIC (MISCELLANEOUS) IMPLANT
COVER BACK TABLE 60X90IN (DRAPES) ×3 IMPLANT
COVER SURGICAL LIGHT HANDLE (MISCELLANEOUS) ×3 IMPLANT
COVER WAND RF STERILE (DRAPES) IMPLANT
CUP SUT UNIV REVERS 39 NEU (Shoulder) ×3 IMPLANT
DERMABOND ADVANCED (GAUZE/BANDAGES/DRESSINGS) ×2
DERMABOND ADVANCED .7 DNX12 (GAUZE/BANDAGES/DRESSINGS) ×1 IMPLANT
DRAPE INCISE IOBAN 66X45 STRL (DRAPES) IMPLANT
DRAPE ORTHO SPLIT 77X108 STRL (DRAPES) ×4
DRAPE SHEET LG 3/4 BI-LAMINATE (DRAPES) ×3 IMPLANT
DRAPE SURG 17X11 SM STRL (DRAPES) ×3 IMPLANT
DRAPE SURG ORHT 6 SPLT 77X108 (DRAPES) ×2 IMPLANT
DRAPE U-SHAPE 47X51 STRL (DRAPES) ×3 IMPLANT
DRSG AQUACEL AG ADV 3.5X10 (GAUZE/BANDAGES/DRESSINGS) ×3 IMPLANT
DURAPREP 26ML APPLICATOR (WOUND CARE) ×3 IMPLANT
ELECT BLADE TIP CTD 4 INCH (ELECTRODE) ×3 IMPLANT
ELECT REM PT RETURN 15FT ADLT (MISCELLANEOUS) ×3 IMPLANT
FACESHIELD WRAPAROUND (MASK) ×12 IMPLANT
GLENOSPHERE REV 39 +4 LAT28 (Shoulder) ×3 IMPLANT
GLOVE BIO SURGEON STRL SZ7.5 (GLOVE) ×3 IMPLANT
GLOVE BIO SURGEON STRL SZ8 (GLOVE) ×3 IMPLANT
GLOVE SS BIOGEL STRL SZ 7 (GLOVE) ×1 IMPLANT
GLOVE SS BIOGEL STRL SZ 7.5 (GLOVE) ×1 IMPLANT
GLOVE SUPERSENSE BIOGEL SZ 7 (GLOVE) ×2
GLOVE SUPERSENSE BIOGEL SZ 7.5 (GLOVE) ×2
GLOVE SURG SYN 7.0 (GLOVE) IMPLANT
GLOVE SURG SYN 7.5  E (GLOVE)
GLOVE SURG SYN 7.5 E (GLOVE) IMPLANT
GLOVE SURG SYN 8.0 (GLOVE) IMPLANT
GOWN STRL REUS W/TWL LRG LVL3 (GOWN DISPOSABLE) ×6 IMPLANT
INSERT HUMERAL 39/+6 (Insert) ×3 IMPLANT
KIT BASIN OR (CUSTOM PROCEDURE TRAY) ×3 IMPLANT
KIT TURNOVER KIT A (KITS) IMPLANT
MANIFOLD NEPTUNE II (INSTRUMENTS) ×3 IMPLANT
NEEDLE TAPERED W/ NITINOL LOOP (MISCELLANEOUS) ×3 IMPLANT
NS IRRIG 1000ML POUR BTL (IV SOLUTION) ×3 IMPLANT
PACK SHOULDER (CUSTOM PROCEDURE TRAY) ×3 IMPLANT
PAD ARMBOARD 7.5X6 YLW CONV (MISCELLANEOUS) ×3 IMPLANT
PAD COLD SHLDR WRAP-ON (PAD) IMPLANT
PIN NITINOL TARGETER 2.8 (PIN) IMPLANT
PIN SET MODULAR GLENOID SYSTEM (PIN) IMPLANT
RESTRAINT HEAD UNIVERSAL NS (MISCELLANEOUS) ×3 IMPLANT
SCREW CENTRAL MOD 30MM (Screw) ×3 IMPLANT
SCREW PERI LOCK 5.5X16 (Screw) ×3 IMPLANT
SCREW PERI LOCK 5.5X32 (Screw) ×3 IMPLANT
SCREW PERI LOCK 5.5X36 (Screw) ×3 IMPLANT
SCREW PERIPHERAL 5.5X20 LOCK (Screw) ×3 IMPLANT
SLING ARM FOAM STRAP LRG (SOFTGOODS) ×3 IMPLANT
SLING ARM FOAM STRAP MED (SOFTGOODS) IMPLANT
SPONGE LAP 18X18 RF (DISPOSABLE) IMPLANT
STEM HUMERAL UNI REVERSE SZ10 (Stem) ×3 IMPLANT
SUCTION FRAZIER HANDLE 12FR (TUBING) ×2
SUCTION TUBE FRAZIER 12FR DISP (TUBING) ×1 IMPLANT
SUT FIBERWIRE #2 38 T-5 BLUE (SUTURE)
SUT MNCRL AB 3-0 PS2 18 (SUTURE) ×3 IMPLANT
SUT MON AB 2-0 CT1 36 (SUTURE) ×3 IMPLANT
SUT VIC AB 1 CT1 36 (SUTURE) ×3 IMPLANT
SUTURE FIBERWR #2 38 T-5 BLUE (SUTURE) IMPLANT
SUTURE TAPE 1.3 40 TPR END (SUTURE) ×2 IMPLANT
SUTURETAPE 1.3 40 TPR END (SUTURE) ×6
TOWEL OR 17X26 10 PK STRL BLUE (TOWEL DISPOSABLE) ×3 IMPLANT
TOWEL OR NON WOVEN STRL DISP B (DISPOSABLE) ×3 IMPLANT
WATER STERILE IRR 1000ML POUR (IV SOLUTION) ×6 IMPLANT

## 2020-01-14 NOTE — Transfer of Care (Signed)
Immediate Anesthesia Transfer of Care Note  Patient: Jimmy Mcintyre  Procedure(s) Performed: REVERSE SHOULDER ARTHROPLASTY (Left Shoulder)  Patient Location: PACU  Anesthesia Type:GA combined with regional for post-op pain  Level of Consciousness: awake, drowsy and patient cooperative  Airway & Oxygen Therapy: Patient Spontanous Breathing and Patient connected to face mask oxygen  Post-op Assessment: Report given to RN and Post -op Vital signs reviewed and stable  Post vital signs: Reviewed and stable  Last Vitals:  Vitals Value Taken Time  BP 152/77 01/14/20 1404  Temp    Pulse 68 01/14/20 1406  Resp 28 01/14/20 1406  SpO2 100 % 01/14/20 1406  Vitals shown include unvalidated device data.  Last Pain:  Vitals:   01/14/20 1131  TempSrc:   PainSc: 0-No pain      Patients Stated Pain Goal: 3 (16/38/46 6599)  Complications: No complications documented.

## 2020-01-14 NOTE — Anesthesia Postprocedure Evaluation (Signed)
Anesthesia Post Note  Patient: Jimmy Mcintyre  Procedure(s) Performed: REVERSE SHOULDER ARTHROPLASTY (Left Shoulder)     Patient location during evaluation: PACU Anesthesia Type: General Level of consciousness: sedated Pain management: pain level controlled Vital Signs Assessment: post-procedure vital signs reviewed and stable Respiratory status: spontaneous breathing and respiratory function stable Cardiovascular status: stable Postop Assessment: no apparent nausea or vomiting Anesthetic complications: no   No complications documented.  Last Vitals:  Vitals:   01/14/20 1515 01/14/20 1528  BP: 129/69 (P) 136/74  Pulse: 64 (!) (P) 58  Resp: 12 (P) 12  Temp:  (P) 36.4 C  SpO2: 93% (P) 95%    Last Pain:  Vitals:   01/14/20 1528  TempSrc:   PainSc: (P) 3             L Sensory Level: (P) C8-Ring and little finger, ulnar forearm (01/14/20 1528)    Shamari Trostel DANIEL

## 2020-01-14 NOTE — Anesthesia Procedure Notes (Signed)
Anesthesia Regional Block: Interscalene brachial plexus block   Pre-Anesthetic Checklist: ,, timeout performed, Correct Patient, Correct Site, Correct Laterality, Correct Procedure, Correct Position, site marked, Risks and benefits discussed,  Surgical consent,  Pre-op evaluation,  At surgeon's request and post-op pain management  Laterality: Left  Prep: chloraprep       Needles:  Injection technique: Single-shot  Needle Type: Echogenic Stimulator Needle     Needle Length: 5cm  Needle Gauge: 22     Additional Needles:   Narrative:  Start time: 01/14/2020 11:11 AM End time: 01/14/2020 11:21 AM Injection made incrementally with aspirations every 5 mL.  Performed by: Personally  Anesthesiologist: Duane Boston, MD  Additional Notes: Functioning IV was confirmed and monitors applied.  A 59mm 22ga echogenic arrow stimulator was used. Sterile prep and drape,hand hygiene and sterile gloves were used.Ultrasound guidance: relevant anatomy identified, needle position confirmed, local anesthetic spread visualized around nerve(s)., vascular puncture avoided.  Image printed for medical record.  Negative aspiration and negative test dose prior to incremental administration of local anesthetic. The patient tolerated the procedure well.

## 2020-01-14 NOTE — Discharge Instructions (Signed)
 Kevin M. Supple, M.D., F.A.A.O.S. Orthopaedic Surgery Specializing in Arthroscopic and Reconstructive Surgery of the Shoulder [phone] [address] - [place], [place] [address] - Fax [phone]   POST-OP TOTAL SHOULDER REPLACEMENT INSTRUCTIONS  1. Follow up in the office for your first post-op appointment 10-14 days from the date of your surgery. If you do not already have a scheduled appointment, our office will contact you to schedule.  2. The bandage over your incision is waterproof. You may begin showering with this dressing on. You may leave this dressing on until first follow up appointment within 2 weeks. We prefer you leave this dressing in place until follow up however after 5-7 days if you are having itching or skin irritation and would like to remove it you may do so. Go slow and tug at the borders gently to break the bond the dressing has with the skin. At this point if there is no drainage it is okay to go without a bandage or you may cover it with a light guaze and tape. You can also expect significant bruising around your shoulder that will drift down your arm and into your chest wall. This is very normal and should resolve over several days.   3. Wear your sling/immobilizer at all times except to perform the exercises below or to occasionally let your arm dangle by your side to stretch your elbow. You also need to sleep in your sling immobilizer until instructed otherwise. It is ok to remove your sling if you are sitting in a controlled environment and allow your arm to rest in a position of comfort by your side or on your lap with pillows to give your neck and skin a break from the sling. You may remove it to allow arm to dangle by side to shower. If you are up walking around and when you go to sleep at night you need to wear it.  4. Range of motion to your elbow, wrist, and hand are encouraged 3-5 times daily. Exercise to your hand and fingers helps to reduce  swelling you may experience.   5. Prescriptions for a pain medication and a muscle relaxant are provided for you. It is recommended that if you are experiencing pain that you pain medication alone is not controlling, add the muscle relaxant along with the pain medication which can give additional pain relief. The first 1-2 days is generally the most severe of your pain and then should gradually decrease. As your pain lessens it is recommended that you decrease your use of the pain medications to an "as needed basis'" only and to always comply with the recommended dosages of the pain medications.  6. Pain medications can produce constipation along with their use. If you experience this, the use of an over the counter stool softener or laxative daily is recommended.   7. For additional questions or concerns, please do not hesitate to call the office. If after hours there is an answering service to forward your concerns to the physician on call.  8.Pain control following an exparel block  To help control your post-operative pain you received a nerve block  performed with Exparel which is a long acting anesthetic (numbing agent) which can provide pain relief and sensations of numbness (and relief of pain) in the operative shoulder and arm for up to 3 days. Sometimes it provides mixed relief, meaning you may still have numbness in certain areas of the arm but can still be able to   move  parts of that arm, hand, and fingers. We recommend that your prescribed pain medications  be used as needed. We do not feel it is necessary to "pre medicate" and "stay ahead" of pain.  Taking narcotic pain medications when you are not having any pain can lead to unnecessary and potentially dangerous side effects.    9. Use the ice machine as much as possible in the first 5-7 days from surgery, then you can wean its use to as needed. The ice typically needs to be replaced every 6 hours, instead of ice you can actually freeze  water bottles to put in the cooler and then fill water around them to avoid having to purchase ice. You can have spare water bottles freezing to allow you to rotate them once they have melted. Try to have a thin shirt or light cloth or towel under the ice wrap to protect your skin.   FOR ADDITIONAL INFO ON ICE MACHINE AND INSTRUCTIONS GO TO THE WEBSITE AT  https://www.[employer].com/products/donjoy/donjoy-iceman-[email or web address]  10.  We recommend that you avoid any dental work or cleaning in the first 3 months following your joint replacement. This is to help minimize the possibility of infection from the bacteria in your mouth that enters your bloodstream during dental work. We also recommend that you take an antibiotic prior to your dental work for the first year after your shoulder replacement to further help reduce that risk. Please simply contact our office for antibiotics to be sent to your pharmacy prior to dental work.  11. Dental Antibiotics:  In most cases prophylactic antibiotics for Dental procdeures after total joint surgery are not necessary.  Exceptions are as follows:  1. History of prior total joint infection  2. Severely immunocompromised (Organ Transplant, cancer chemotherapy, Rheumatoid biologic meds such as Humera)  3. Poorly controlled diabetes (A1C &gt; 8.0, blood glucose over 200)  If you have one of these conditions, contact your surgeon for an antibiotic prescription, prior to your dental procedure.   POST-OP EXERCISES  Pendulum Exercises  Perform pendulum exercises while standing and bending at the waist. Support your uninvolved arm on a table or chair and allow your operated arm to hang freely. Make sure to do these exercises passively - not using you shoulder muscles. These exercises can be performed once your nerve block effects have worn off.  Repeat 20 times. Do 3 sessions per day.     

## 2020-01-14 NOTE — Op Note (Signed)
01/14/2020  1:51 PM  PATIENT:   Jimmy Mcintyre  74 y.o. male  PRE-OPERATIVE DIAGNOSIS:  advanced left shoulder osteoarthritis  POST-OPERATIVE DIAGNOSIS: Same with the operative finding of severe bony deformity of the humeral head and severely restricted motion  PROCEDURE: Left shoulder reverse arthroplasty utilizing a press-fit size 10 Arthrex stem with a +3 polyethylene insert on a neutral metaphysis, 39/+4 glenosphere on a large/+2 baseplate.  SURGEON:  Marin Shutter M.D.  ASSISTANTS: Jenetta Loges, PA-C  ANESTHESIA:   General endotracheal and interscalene block with Exparel  EBL: 150 cc  SPECIMEN: None  Drains: None   PATIENT DISPOSITION:  PACU - hemodynamically stable.    PLAN OF CARE: Discharge to home after PACU  Brief history:  Jimmy Mcintyre is a 74 year old gentleman who has had chronic left shoulder pain and associated severe restrictions in shoulder mobility now with increasing functional imitations.  He is brought to the operating this time for planned left shoulder reverse arthroplasty.  Preoperatively, I counseled the patient regarding treatment options and risks versus benefits thereof.  Possible surgical complications were all reviewed including potential for bleeding, infection, neurovascular injury, persistent pain, loss of motion, anesthetic complication, failure of the implant, and possible need for additional surgery. They understand and accept and agrees with our planned procedure.   Procedure in detail:  After undergoing routine preop evaluation the patient received prophylactic antibiotics and interscalene block with Exparel was established in the holding area by the anesthesia department.  Patient was subsequently placed supine on the operating table and underwent the smooth induction of a general endotracheal anesthesia.  Placed into the beachchair position and appropriately padded and protected.  The left shoulder girdle region was sterilely prepped  and draped in standard fashion.  Timeout was called.  An anterior deltopectoral approach was made to the left shoulder through a 10 cm incision.  Skin flaps elevated dissection carried deeply and the deltopectoral interval was then developed from proximal to distal with the vein taken laterally.  The conjoined tendon was mobilized and retracted medially.  The long head bicep tendon was then tenodesed at the upper border the pectoralis major tendon with proximal segment unroofed and excised.  The rotator cuff was then split along the rotator interval to the base of the coracoid and subscapularis was divided from its insertion into the lesser tuberosity.  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  As such it was clear that there was no elasticity and this was not a repairable subscapularis so it was divided from its lesser tuberosity attachment and allowed to reflect medially.  Capsular attachments were then divided from the anterior infra margins of the humeral neck and there was a very large inferior osteophyte that we carefully dissected free in a subperiosteal fashion ultimately allowing deliver the head through the wound and there was severe deformity of the humeral head with complete flattening and very large peripheral osteophytes.  The extra medullary guide was then used to outline the proposed humeral head resection which we performed using an oscillating saw at the native retroversion of approximately 20 degrees.  A rondure and osteotome was then used to remove the remaining osteophytes from the margins of the humeral neck.  A metal cap was then placed over the cut proximal humeral surface.  At this point we then exposed the glenoid with appropriate retractors and performed a circumferential  labral resection.  Several large osteophytes at the margins of the glenoid were  removed as well.  This was a very large and dished out glenoid and elected to use the large baseplate.  A guidepin was then directed into the center of the glenoid at an approximately 10 degree inferior tilt and the glenoid was then reamed with the large central followed by peripheral reamers.  The central drill hole was then completed with the drill and tap and a 30 mm lag screw was utilized.  The large baseplate was then inserted with excellent purchase and fixation.  The peripheral locking screws were all then placed with good purchase and fixation.  A 39/+4 glenosphere was then impacted onto the baseplate and the central locking screw was placed.  We then returned our attention to the proximal humerus where the canal was opened by hand reaming and we broached up to a size 10 at the native retroversion of approximate 20 degrees.  A central metaphyseal reamer was then utilized.  A trial reduction showed good soft tissue balance good motion good stability.  Our final size 10 implant was then assembled and then impacted with excellent fit and fixation.  A series of trial reductions ultimately demonstrated that the +6 polyethylene insert gave [place] the best soft tissue balance with good motion good stability.  The trial was removed the final +6 polywas then impacted on the implant.  Final reduction was then performed.  Made very pleased with overall motion stability and soft tissue balance.  The joints and copiously irrigated.  Hemostasis was obtained.  The deltopectoral interval was then reapproximated with a series of figure-of-eight and 1 Vicryl sutures.  2-0 Vicryl used for subcu layer and intracuticular 3-0 Monocryl for the skin followed by Dermabond and Aquacel dressing.  Left arm was then placed into a sling.  The patient was awakened, extubated, and taken to the recovery room in stable condition.  Jenetta Loges, PA-C was utilized as an Environmental consultant throughout this case, essential for help with positioning the  patient, positioning extremity, tissue manipulation, implantation of the prosthesis, suture management, wound closure, and intraoperative decision-making.  Marin Shutter MD   Contact # [phone]

## 2020-01-14 NOTE — Progress Notes (Signed)
Assisted Dr. Singer with left, ultrasound guided, interscalene  block. Side rails up, monitors on throughout procedure. See vital signs in flow sheet. Tolerated Procedure well.  

## 2020-01-14 NOTE — Evaluation (Signed)
Occupational Therapy Evaluation Patient Details Name: Jimmy Mcintyre MRN: [id number] DOB: 04-01-1945 Today's Date: 01/14/2020    History of Present Illness S/p Left reverse shoulder arthroplasty   Clinical Impression   Mr. Donovan Persley is a 74 year old man s/p shoulder replacement without functional use of left non dominant upper extremity secondary to effects of surgery, interscalene block and shoulder precautions. Therapist provided education and instruction to patient and spouse in regards to exercises, precautions, positioning, donning upper extremity clothing and bathing while maintaining shoulder precautions, ice and edema management and donning/doffing sling. Patient and spouse verbalized understanding and demonstrated as needed. Handouts provided to maximize education retention. Patient needed assistance to donn shirt but able to don underwear and pants. Patient reports familiarity with compensatory techniques and exercises from prior shoulder difficulty. Patient to follow up with MD for further therapy needs.      Follow Up Recommendations  Follow surgeons recommendation for DC plan and follow-up therapies    Equipment Recommendations  None recommended by OT    Recommendations for Other Services       Precautions / Restrictions Precautions Precautions: Shoulder Type of Shoulder Precautions: If sitting in controlled environment, ok to come out of sling to give neck a break. Please sleep in it to protect until follow up in office. OK to use operative arm for feeding, hygiene and ADLs. Ok to instruct Pendulums and lap slides as exercises. Ok to use operative arm within the following parameters for ADL purposes New ROM (8/18) Ok for PROM, AAROM, AROM within pain tolerance and within the following ROM ER 20 ABD 45 FE 60 Shoulder Interventions: Shoulder sling/immobilizer;Off for dressing/bathing/exercises;At all times Precaution Booklet Issued: Yes (comment) Required Braces or Orthoses:  Sling Restrictions Weight Bearing Restrictions: Yes LUE Weight Bearing: Non weight bearing      Mobility Bed Mobility                    Transfers                      Balance Overall balance assessment: No apparent balance deficits (not formally assessed)                                         ADL either performed or assessed with clinical judgement   ADL Overall ADL's : Needs assistance/impaired Eating/Feeding: Modified independent   Grooming: Modified independent   Upper Body Bathing: Modified independent   Lower Body Bathing: Modified independent   Upper Body Dressing : Moderate assistance;Adhering to UE precautions;Cueing for compensatory techniques Upper Body Dressing Details (indicate cue type and reason): doning shirt Lower Body Dressing: Modified independent Lower Body Dressing Details (indicate cue type and reason): Patient able to don underwear and pants with RUE only Toilet Transfer: Modified Independent   Toileting- Clothing Manipulation and Hygiene: Modified independent               Vision Patient Visual Report: No change from baseline       Perception     Praxis      Pertinent Vitals/Pain Pain Assessment: No/denies pain     Hand Dominance Right   Extremity/Trunk Assessment Upper Extremity Assessment Upper Extremity Assessment: LUE deficits/detail LUE Deficits / Details: Gross functional Rom of fingers and wrist, decreased forearm ROM, elbow and shoulder secondary to effects of interscalene block.   Lower Extremity Assessment Lower  Extremity Assessment: Overall WFL for tasks assessed   Cervical / Trunk Assessment Cervical / Trunk Assessment: Normal   Communication     Cognition Arousal/Alertness: Awake/alert Behavior During Therapy: WFL for tasks assessed/performed Overall Cognitive Status: Within Functional Limits for tasks assessed                                     General  Comments       Exercises     Shoulder Instructions Shoulder Instructions Donning/doffing shirt without moving shoulder: Minimal assistance;Patient able to independently direct caregiver Method for sponge bathing under operated UE: Patient able to independently direct caregiver Donning/doffing sling/immobilizer: Patient able to independently direct caregiver Correct positioning of sling/immobilizer: Independent Pendulum exercises (written home exercise program): Independent ROM for elbow, wrist and digits of operated UE: Independent Sling wearing schedule (on at all times/off for ADL's): Independent Proper positioning of operated UE when showering: Independent Dressing change: Independent Positioning of UE while sleeping: [place] expects to be discharged to:: Private residence Living Arrangements: Spouse/significant other Available Help at Discharge: Family;Available 24 hours/day                                    Prior Functioning/Environment Level of Independence: Independent                 OT Problem List: Impaired UE functional use      OT Treatment/Interventions:      OT Goals(Current goals can be found in the care plan section) Acute Rehab OT Goals OT Goal Formulation: All assessment and education complete, DC therapy  OT Frequency:     Barriers to D/C:            Co-evaluation              AM-PAC OT "6 Clicks" Daily Activity     Outcome Measure Help from another person eating meals?: None Help from another person taking care of personal grooming?: None Help from another person toileting, which includes using toliet, bedpan, or urinal?: None Help from another person bathing (including washing, rinsing, drying)?: None Help from another person to put on and taking off regular upper body clothing?: A Little Help from another person to put on and taking off regular lower body clothing?: None 6 Click  Score: 23   End of Session Nurse Communication:  (OT education complete)  Activity Tolerance: Patient tolerated treatment well Patient left: in chair  OT Visit Diagnosis: Muscle weakness (generalized) (M62.81)                Time: 4818-5631 OT Time Calculation (min): 29 min Charges:  OT General Charges $OT Visit: 1 Visit OT Evaluation $OT Eval Low Complexity: 1 Low OT Treatments $Self Care/Home Management : 8-22 mins  Mandy Peeks, OTR/L Acute Care Rehab Services  Office [phone] Pager: [phone]   Lenward Chancellor 01/14/2020, 4:07 PM

## 2020-01-14 NOTE — H&P (Signed)
Jimmy Mcintyre    Chief Complaint: advanced left shoulder osteoarthritis HPI: The patient is a 73 y.o. male with chronic and progressively increasing left shoulder pain related to severe osteoarthritis.  Due to his increasing functional imitations and failure to respond to prolonged attempts at conservative management he is brought to the operating room this time for planned left shoulder reverse arthroplasty.    Past Medical History:  Diagnosis Date  . Abnormal prostate specific antigen 11/16/2013  . Arteriosclerosis of coronary artery 10/29/2014   Overview:  pci stent of lad   . Arthritis, degenerative 09/30/2013   Overview:   a.  Shoulders severe.   b.  Cervical spine.   c.  Lumbar spine   . Benign essential HTN 10/11/2014  . Breath shortness 09/30/2013  . Cancer ([place])    PROSTATE   . 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 Diabetes mellitus type 2, uncontrolled ([place]) 11/16/2013   TYPE 2   . Essential (primary) hypertension 10/29/2014  . GERD (gastroesophageal reflux disease)   . Glaucoma   . Obstructive apnea 04/20/2014   NO CPAP   . Sleep apnea   . TI (tricuspid incompetence) 04/29/2014    Past Surgical History:  Procedure Laterality Date  . APPENDECTOMY    . CORONARY ANGIOPLASTY WITH STENT PLACEMENT  2001  . PENILE PROSTHESIS IMPLANT  2000  . SHOULDER SURGERY Right 2014  . TONSILLECTOMY      Family History  Problem Relation Age of Onset  . CAD Mother   . Diabetes Mellitus II Mother   . CAD Father   . Prostate cancer Neg Hx   . Bladder Cancer Neg Hx     Social History:  reports that he has never smoked. He has never used smokeless tobacco. He reports current alcohol use. He reports that he does not use drugs.   Medications Prior to Admission  Medication Sig Dispense Refill  . acetaminophen (TYLENOL) 500 MG tablet Take 1,000 mg by mouth every 6 (six) hours as needed for moderate pain.    Marland Kitchen allopurinol (ZYLOPRIM) 300 MG tablet Take 300 mg by mouth  daily.     Marland Kitchen amLODipine-benazepril (LOTREL) 5-20 MG per capsule Take 1 capsule by mouth daily.     Marland Kitchen aspirin 325 MG tablet Take 325 mg by mouth daily.     Marland Kitchen atorvastatin (LIPITOR) 10 MG tablet Take 10 mg by mouth daily.    . cyanocobalamin (,VITAMIN B-12,) 1000 MCG/ML injection Inject 1,000 mcg into the muscle every 30 (thirty) days.     . finasteride (PROSCAR) 5 MG tablet Take 5 mg by mouth daily.     Marland Kitchen gemfibrozil (LOPID) 600 MG tablet Take 600 mg by mouth daily.     Marland Kitchen glimepiride (AMARYL) 4 MG tablet Take 4 mg by mouth daily with breakfast.     . meloxicam (MOBIC) 15 MG tablet Take 15 mg by mouth daily.    . metoprolol tartrate (LOPRESSOR) 25 MG tablet Take 25 mg by mouth daily.    . naproxen sodium (ALEVE) 220 MG tablet Take 220 mg by mouth 2 (two) times daily as needed (pain).     Marland Kitchen omeprazole (PRILOSEC) 20 MG capsule Take 20 mg by mouth daily.     . pioglitazone (ACTOS) 30 MG tablet Take 30 mg by mouth daily.     Marland Kitchen zolpidem (AMBIEN CR) 12.5 MG CR tablet Take 12.5 mg by mouth at bedtime.     Marland Kitchen alfuzosin (UROXATRAL) 10 MG 24 hr  tablet Take by mouth. (Patient not taking: Reported on 01/01/2020)    . hydroxypropyl methylcellulose / hypromellose (ISOPTO TEARS / GONIOVISC) 2.5 % ophthalmic solution Place 1 drop into both eyes 3 (three) times daily as needed for dry eyes.    . metoprolol succinate (TOPROL-XL) 25 MG 24 hr tablet Take 25 mg by mouth daily.  (Patient not taking: Reported on 01/01/2020)    . nabumetone (RELAFEN) 750 MG tablet TAKE 1 TABLET TWICE A DAY AS NEEDED FOR PAIN (Patient not taking: Reported on 01/01/2020)    . simvastatin (ZOCOR) 20 MG tablet Take 20 mg by mouth daily. (Patient not taking: Reported on 01/01/2020)    . SYRINGE/NEEDLE, DISP, 1 ML (BD INTEGRA SYRINGE) 25G X 5/8" 1 ML MISC Use 1 Syringe monthly.    . Testosterone (ANDROGEL) 20.25 MG/1.25GM (1.62%) GEL Apply topically. (Patient not taking: Reported on 01/01/2020)       Physical Exam: Left shoulder demonstrates  painful and severely restricted motion as noted at his recent office visit.  Remains neurovascular intact.  Plain radiographs confirm severe degenerative joint disease with deformity of the humeral head peripheral osteophyte formation, and subchondral sclerosis.  Vitals  Temp:  [98.4 F (36.9 C)] 98.4 F (36.9 C) (11/11 1019) Pulse Rate:  [51] 51 (11/11 1019) Resp:  [16] 16 (11/11 1019) BP: (165)/(78) 165/78 (11/11 1019) SpO2:  [100 %] 100 % (11/11 1019) Weight:  [84.4 kg] 84.4 kg (11/11 1039)  Assessment/Plan  Impression: advanced left shoulder osteoarthritis  Plan of Action: Procedure(s): REVERSE SHOULDER ARTHROPLASTY  Jimmy Mcintyre M Lydiah Pong 01/14/2020, 11:10 AM Contact # [phone]

## 2020-01-14 NOTE — Anesthesia Procedure Notes (Signed)
Procedure Name: Intubation Date/Time: 01/14/2020 12:07 PM Performed by: West Pugh, CRNA Pre-anesthesia Checklist: Patient identified, Emergency Drugs available, Suction available, Patient being monitored and Timeout performed Patient Re-evaluated:Patient Re-evaluated prior to induction Oxygen Delivery Method: Circle system utilized Preoxygenation: Pre-oxygenation with 100% oxygen Induction Type: IV induction Ventilation: Mask ventilation without difficulty Laryngoscope Size: Mac and 4 Grade View: Grade II Tube type: Oral Tube size: 7.5 mm Number of attempts: 1 Airway Equipment and Method: Stylet Placement Confirmation: ETT inserted through vocal cords under direct vision,  positive ETCO2 and breath sounds checked- equal and bilateral Secured at: 22 cm Tube secured with: Tape Dental Injury: Teeth and Oropharynx as per pre-operative assessment

## 2020-01-14 NOTE — Anesthesia Preprocedure Evaluation (Addendum)
Anesthesia Evaluation  Patient identified by MRN, date of birth, ID band Patient awake    Reviewed: Allergy & Precautions, NPO status , Patient's Chart, lab work & pertinent test results  History of Anesthesia Complications Negative for: history of anesthetic complications  Airway Mallampati: II  TM Distance: >3 FB Neck ROM: Full    Dental  (+) Caps, Dental Advisory Given   Pulmonary sleep apnea ,    Pulmonary exam normal        Cardiovascular hypertension, Pt. on medications and Pt. on home beta blockers + CAD and + Cardiac Stents  Normal cardiovascular exam  Study Conclusions Echo 2018  - Left ventricle: Systolic function was normal. The estimated  ejection fraction was in the range of 60% to 65%.  - Aortic valve: Valve area (Vmax): 2.76 cm^2.    Neuro/Psych negative neurological ROS  negative psych ROS   GI/Hepatic Neg liver ROS, GERD  ,  Endo/Other  diabetes  Renal/GU negative Renal ROS     Musculoskeletal negative musculoskeletal ROS (+)   Abdominal   Peds  Hematology negative hematology ROS (+)   Anesthesia Other Findings   Reproductive/Obstetrics                            Anesthesia Physical Anesthesia Plan  ASA: III  Anesthesia Plan: General   Post-op Pain Management:  Regional for Post-op pain   Induction: Intravenous  PONV Risk Score and Plan: 3 and Ondansetron, Dexamethasone and Midazolam  Airway Management Planned: Oral ETT  Additional Equipment:   Intra-op Plan:   Post-operative Plan: Extubation in OR  Informed Consent: I have reviewed the patients History and Physical, chart, labs and discussed the procedure including the risks, benefits and alternatives for the proposed anesthesia with the patient or authorized representative who has indicated his/her understanding and acceptance.     Dental advisory given  Plan Discussed with: CRNA,  Anesthesiologist and Surgeon  Anesthesia Plan Comments:         Anesthesia Quick Evaluation

## 2020-01-20 ENCOUNTER — Encounter (HOSPITAL_COMMUNITY): Payer: Self-pay | Admitting: Orthopedic Surgery

## 2020-04-18 DIAGNOSIS — K2270 Barrett's esophagus without dysplasia: Secondary | ICD-10-CM | POA: Insufficient documentation

## 2020-05-12 ENCOUNTER — Other Ambulatory Visit: Payer: Self-pay

## 2020-05-12 ENCOUNTER — Inpatient Hospital Stay: Payer: Medicare Other | Attending: Radiation Oncology

## 2020-05-12 ENCOUNTER — Other Ambulatory Visit: Payer: Self-pay | Admitting: Licensed Clinical Social Worker

## 2020-05-12 DIAGNOSIS — C61 Malignant neoplasm of prostate: Secondary | ICD-10-CM

## 2020-05-12 LAB — PSA: Prostatic Specific Antigen: 0.03 ng/mL (ref 0.00–4.00)

## 2020-05-18 ENCOUNTER — Ambulatory Visit
Admission: RE | Admit: 2020-05-18 | Discharge: 2020-05-18 | Disposition: A | Discharge status: Home / Self Care | Payer: Medicare Other | Source: Ambulatory Visit | Attending: Radiation Oncology | Admitting: Radiation Oncology

## 2020-05-18 ENCOUNTER — Encounter: Payer: Self-pay | Admitting: Radiation Oncology

## 2020-05-18 VITALS — BP 141/80 | HR 63 | Wt 187.0 lb

## 2020-05-18 DIAGNOSIS — C61 Malignant neoplasm of prostate: Secondary | ICD-10-CM

## 2020-05-18 NOTE — Progress Notes (Signed)
Radiation Oncology Follow up Note  Name: Jimmy Mcintyre   Date:   05/18/2020 MRN:  [id number] DOB: 12-12-1945    This 75 y.o. male presents to the clinic today for 5-year follow-up status post IMRT radiation therapy for stage IIb adenocarcinoma the prostate.  REFERRING PROVIDER: Idelle Crouch, MD  HPI: Patient is a 75 year old male now out 5 years having completed IMRT image guided radiation therapy for Gleason 7 (3+4) adenocarcinoma presenting with a PSA of 7.7.  Seen today in routine follow-up he is doing well.  He specifically denies any increased lower urinary tract symptoms diarrhea or fatigue.Marland Kitchen  His PSA remains 0.03 actually a slight decrease from 2 to 3 years prior.  COMPLICATIONS OF TREATMENT: none  FOLLOW UP COMPLIANCE: keeps appointments   PHYSICAL EXAM:  BP (!) 141/80   Pulse 63   Wt 187 lb (84.8 kg)   BMI 27.62 kg/m  Well-developed well-nourished patient in NAD. HEENT reveals PERLA, EOMI, discs not visualized.  Oral cavity is clear. No oral mucosal lesions are identified. Neck is clear without evidence of cervical or supraclavicular adenopathy. Lungs are clear to A&P. Cardiac examination is essentially unremarkable with regular rate and rhythm without murmur rub or thrill. Abdomen is benign with no organomegaly or masses noted. Motor sensory and DTR levels are equal and symmetric in the upper and lower extremities. Cranial nerves II through XII are grossly intact. Proprioception is intact. No peripheral adenopathy or edema is identified. No motor or sensory levels are noted. Crude visual fields are within normal range.  RADIOLOGY RESULTS: No current films to review  PLAN: Present time patient is now about 5 years with no evidence of disease under excellent biochemical control of his prostate cancer.  I am in turn follow-up care over to his PMD and asked him to perform yearly PSA.  I be happy to reevaluate the patient anytime should further consultation be indicated.  Patient  knows to call with any concerns.  I would like to take this opportunity to thank you for allowing me to participate in the care of your patient.Noreene Filbert, MD

## 2020-06-03 ENCOUNTER — Ambulatory Visit (INDEPENDENT_AMBULATORY_CARE_PROVIDER_SITE_OTHER): Payer: Medicare Other | Admitting: Urology

## 2020-06-03 ENCOUNTER — Encounter: Payer: Self-pay | Admitting: Urology

## 2020-06-03 ENCOUNTER — Other Ambulatory Visit: Payer: Self-pay

## 2020-06-03 VITALS — BP 151/76 | HR 76 | Ht 69.0 in | Wt 184.0 lb

## 2020-06-03 DIAGNOSIS — N401 Enlarged prostate with lower urinary tract symptoms: Secondary | ICD-10-CM

## 2020-06-03 DIAGNOSIS — R32 Unspecified urinary incontinence: Secondary | ICD-10-CM

## 2020-06-03 DIAGNOSIS — R350 Frequency of micturition: Secondary | ICD-10-CM

## 2020-06-03 DIAGNOSIS — Z8546 Personal history of malignant neoplasm of prostate: Secondary | ICD-10-CM

## 2020-06-03 LAB — URINALYSIS, COMPLETE
Bilirubin, UA: NEGATIVE
Glucose, UA: NEGATIVE
Ketones, UA: NEGATIVE
Leukocytes,UA: NEGATIVE
Nitrite, UA: NEGATIVE
Protein,UA: NEGATIVE
RBC, UA: NEGATIVE
Specific Gravity, UA: 1.025 (ref 1.005–1.030)
Urobilinogen, Ur: 0.2 mg/dL (ref 0.2–1.0)
pH, UA: 5 (ref 5.0–7.5)

## 2020-06-03 LAB — MICROSCOPIC EXAMINATION
Bacteria, UA: NONE SEEN
Epithelial Cells (non renal): NONE SEEN /hpf (ref 0–10)
RBC, Urine: NONE SEEN /hpf (ref 0–2)
WBC, UA: NONE SEEN /hpf (ref 0–5)

## 2020-06-03 NOTE — Progress Notes (Signed)
06/03/2020 1:36 PM   Jimmy Mcintyre 1945-11-16 [id number]  Referring provider: Idelle Crouch, MD [facility] [place],  [place] [address]  Chief Complaint  Patient presents with  . Prostate Cancer    HPI: Jimmy Mcintyre is a 75 y.o. male with a history of prostate cancer who presents to reestablish care.   Initially seen here by Dr. Tresa Moore August 2016 for an elevated PSA 7.72 on finasteride  Prostate biopsy 11/2014 with a 42 g prostate and pathology 6/6 left-sided cores + Gleason 3+4 adenocarcinoma with 1 core Gleason 4+3 and 1 core Gleason 3+3  Referred to radiation oncology and underwent IMRT completed 03/21/2015  Last urology follow-up was in 2017 she does saw Dr. Baruch Gouty in radiation oncology last month and PSA stable at 0.03   Other prior urologic history remarkable for hypogonadism previously on TRT, ED status post IPP placement and BPH with LUTS   States that his recent visit with Dr. Baruch Gouty urology follow-up was recommended for urinary incontinence  Complains of urinary frequency and dribbling at the end of urination; difficult to determine if having of urge incontinence  Denies stress incontinence  Did take oxybutynin which he did not see any improvement  Wears pads and underwear and states he developed a cam lesion of the glans secondary to adhesive from the pads and is being treated by dermatology  Denies gross hematuria   PMH: Past Medical History:  Diagnosis Date  . Abnormal prostate specific antigen 11/16/2013  . Arteriosclerosis of coronary artery 10/29/2014   Overview:  pci stent of lad   . Arthritis, degenerative 09/30/2013   Overview:   a.  Shoulders severe.   b.  Cervical spine.   c.  Lumbar spine   . Benign essential HTN 10/11/2014  . Breath shortness 09/30/2013  . Cancer ([place])    PROSTATE   . 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 Diabetes mellitus type 2, uncontrolled ([place]) 11/16/2013   TYPE 2    . Essential (primary) hypertension 10/29/2014  . GERD (gastroesophageal reflux disease)   . Glaucoma   . Obstructive apnea 04/20/2014   NO CPAP   . Sleep apnea   . TI (tricuspid incompetence) 04/29/2014    Surgical History: Past Surgical History:  Procedure Laterality Date  . APPENDECTOMY    . CORONARY ANGIOPLASTY WITH STENT PLACEMENT  2001  . PENILE PROSTHESIS IMPLANT  2000  . REVERSE SHOULDER ARTHROPLASTY Left 01/14/2020   Procedure: REVERSE SHOULDER ARTHROPLASTY;  Surgeon: Justice Britain, MD;  Location: [facility];  Service: Orthopedics;  Laterality: Left;  189min  . SHOULDER SURGERY Right 2014  . TONSILLECTOMY      Home Medications:  Allergies as of 06/03/2020      Reactions   Bactrim [sulfamethoxazole-trimethoprim] Rash   Levofloxacin Rash      Medication List       Accurate as of June 03, 2020  1:36 PM. If you have any questions, ask your nurse or doctor.        STOP taking these medications   cyclobenzaprine 10 MG tablet Commonly known as: FLEXERIL Stopped by: Abbie Sons, MD   meloxicam 15 MG tablet Commonly known as: MOBIC Stopped by: Abbie Sons, MD   ondansetron 4 MG tablet Commonly known as: Zofran Stopped by: Abbie Sons, MD   oxyCODONE-acetaminophen 5-325 MG tablet Commonly known as: Percocet Stopped by: Abbie Sons, MD     TAKE these medications   acetaminophen 500  MG tablet Commonly known as: TYLENOL Take 1,000 mg by mouth every 6 (six) hours as needed for moderate pain.   allopurinol 300 MG tablet Commonly known as: ZYLOPRIM Take 300 mg by mouth daily.   amLODipine-benazepril 5-20 MG capsule Commonly known as: LOTREL Take 1 capsule by mouth daily.   aspirin 325 MG tablet Take 325 mg by mouth daily.   atorvastatin 10 MG tablet Commonly known as: LIPITOR Take 10 mg by mouth daily.   cyanocobalamin 1000 MCG/ML injection Commonly known as: (VITAMIN B-12) Inject 1,000 mcg into the muscle every 30 (thirty) days.    finasteride 5 MG tablet Commonly known as: PROSCAR Take 5 mg by mouth daily.   gemfibrozil 600 MG tablet Commonly known as: LOPID Take 600 mg by mouth daily.   glimepiride 4 MG tablet Commonly known as: AMARYL Take 4 mg by mouth daily with breakfast.   hydroxypropyl methylcellulose / hypromellose 2.5 % ophthalmic solution Commonly known as: ISOPTO TEARS / GONIOVISC Place 1 drop into both eyes 3 (three) times daily as needed for dry eyes.   metoprolol tartrate 25 MG tablet Commonly known as: LOPRESSOR Take 25 mg by mouth daily.   omeprazole 20 MG capsule Commonly known as: PRILOSEC Take 20 mg by mouth daily.   pioglitazone 30 MG tablet Commonly known as: ACTOS Take 30 mg by mouth daily.   SYRINGE/NEEDLE (DISP) 1 ML 25G X 5/8" 1 ML Misc Use 1 Syringe monthly.   zolpidem 12.5 MG CR tablet Commonly known as: AMBIEN CR Take 12.5 mg by mouth at bedtime.       Allergies:  Allergies  Allergen Reactions  . Bactrim [Sulfamethoxazole-Trimethoprim] Rash  . Levofloxacin Rash    Family History: Family History  Problem Relation Age of Onset  . CAD Mother   . Diabetes Mellitus II Mother   . CAD Father   . Prostate cancer Neg Hx   . Bladder Cancer Neg Hx     Social History:  reports that he has never smoked. He has never used smokeless tobacco. He reports current alcohol use. He reports that he does not use drugs.   Physical Exam: BP (!) 151/76   Pulse 76   Ht 5\' 9"  (1.753 m)   Wt 184 lb (83.5 kg)   BMI 27.17 kg/m   Constitutional:  Alert and oriented, No acute distress. HEENT: [place] AT, moist mucus membranes.  Trachea midline, no masses. Cardiovascular: No clubbing, cyanosis, or edema. Respiratory: Normal respiratory effort, no increased work of breathing. Neurologic: Grossly intact, no focal deficits, moving all 4 extremities. Psychiatric: Normal mood and affect.    Assessment & Plan:    1.  Urinary incontinence  Urge versus double voiding  Given  Myrbetriq samples x4 weeks and instructed to call back regarding efficacy  Recommend cystoscopy if no improvement in voiding pattern  2.  History of prostate cancer (intermediate risk)  5 years out from radiation with a low and stable PSA   Abbie Sons, MD  [facility] [address] [place], [place] [address] [phone]

## 2020-06-05 ENCOUNTER — Encounter: Payer: Self-pay | Admitting: Urology

## 2020-06-06 ENCOUNTER — Telehealth: Payer: Self-pay | Admitting: *Deleted

## 2020-06-06 NOTE — Telephone Encounter (Signed)
Left message per DPR 

## 2020-06-06 NOTE — Telephone Encounter (Signed)
-----   Message from Abbie Sons, MD sent at 06/03/2020 11:29 PM EDT ----- Urinalysis showed no evidence of infection

## 2020-06-28 ENCOUNTER — Other Ambulatory Visit: Payer: Self-pay

## 2020-06-28 ENCOUNTER — Encounter: Payer: Self-pay | Admitting: Physician Assistant

## 2020-06-28 ENCOUNTER — Ambulatory Visit (INDEPENDENT_AMBULATORY_CARE_PROVIDER_SITE_OTHER): Payer: Medicare Other | Admitting: Physician Assistant

## 2020-06-28 VITALS — BP 135/72 | HR 58 | Ht 69.0 in | Wt 185.0 lb

## 2020-06-28 DIAGNOSIS — R109 Unspecified abdominal pain: Secondary | ICD-10-CM

## 2020-06-28 LAB — URINALYSIS, COMPLETE
Bilirubin, UA: NEGATIVE
Glucose, UA: NEGATIVE
Ketones, UA: NEGATIVE
Leukocytes,UA: NEGATIVE
Nitrite, UA: NEGATIVE
RBC, UA: NEGATIVE
Specific Gravity, UA: 1.03 (ref 1.005–1.030)
Urobilinogen, Ur: 0.2 mg/dL (ref 0.2–1.0)
pH, UA: 5 (ref 5.0–7.5)

## 2020-06-28 LAB — MICROSCOPIC EXAMINATION
Bacteria, UA: NONE SEEN
RBC, Urine: NONE SEEN /hpf (ref 0–2)

## 2020-06-28 LAB — BLADDER SCAN AMB NON-IMAGING

## 2020-06-28 MED ORDER — TAMSULOSIN HCL 0.4 MG PO CAPS: 0.4000 mg | ORAL_CAPSULE | Freq: Every day | ORAL | 0 refills | Status: DC

## 2020-06-28 NOTE — Progress Notes (Signed)
06/28/2020 10:08 AM   Jimmy Mcintyre [birth year] [id number]  CC: Chief Complaint  Patient presents with  . Flank Pain   HPI: Jimmy Mcintyre is a 75 y.o. male with PMH prostate cancer s/p IMRT, hypogonadism previously on TRT, ED s/p IPP placement, and BPH with LUTS including urinary frequency and postvoid dribbling who presents today for evaluation of right flank pain.  Today he reports a 1 week history of right flank pain radiating to the mid axillary line.  He describes the pain as constant and sharp in nature.  He has taken Aleve at home, which has palliated the pain.  He denies fever, chills, nausea, vomiting, gross hematuria, or any recent MSK injuries including falls or hits.  He states he has a history of a possible kidney stone in the past, in which he had multiple weeks of severe flank pain and was found to have microscopic hematuria on UA when he sought care in the emergency department, however CT scan did not show any stones at that time.  They suspected he may have recently passed a stone.  He underwent CTAP with contrast on 04/04/2017, which revealed no urolithiasis.  In-office UA today positive for 1+ protein; urine microscopy pan negative. PVR 41m.  PMH: Past Medical History:  Diagnosis Date  . Abnormal prostate specific antigen 11/16/2013  . Arteriosclerosis of coronary artery 10/29/2014   Overview:  pci stent of lad   . Arthritis, degenerative 09/30/2013   Overview:   a.  Shoulders severe.   b.  Cervical spine.   c.  Lumbar spine   . Benign essential HTN 10/11/2014  . Breath shortness 09/30/2013  . Cancer ([place]    PROSTATE   . 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 Diabetes mellitus type 2, uncontrolled ([place] 11/16/2013   TYPE 2   . Essential (primary) hypertension 10/29/2014  . GERD (gastroesophageal reflux disease)   . Glaucoma   . Obstructive apnea 04/20/2014   NO CPAP   . Sleep apnea   . TI (tricuspid incompetence) 04/29/2014    Surgical  History: Past Surgical History:  Procedure Laterality Date  . APPENDECTOMY    . CORONARY ANGIOPLASTY WITH STENT PLACEMENT  2001  . PENILE PROSTHESIS IMPLANT  2000  . REVERSE SHOULDER ARTHROPLASTY Left 01/14/2020   Procedure: REVERSE SHOULDER ARTHROPLASTY;  Surgeon: SJustice Britain MD;  Location: [facility];  Service: Orthopedics;  Laterality: Left;  1224m  . SHOULDER SURGERY Right 2014  . TONSILLECTOMY      Home Medications:  Allergies as of 06/28/2020      Reactions   Bactrim [sulfamethoxazole-trimethoprim] Rash   Levofloxacin Rash      Medication List       Accurate as of June 28, 2020 10:08 AM. If you have any questions, ask your nurse or doctor.        acetaminophen 500 MG tablet Commonly known as: TYLENOL Take 1,000 mg by mouth every 6 (six) hours as needed for moderate pain.   allopurinol 300 MG tablet Commonly known as: ZYLOPRIM Take 300 mg by mouth daily.   amLODipine-benazepril 5-20 MG capsule Commonly known as: LOTREL Take 1 capsule by mouth daily.   aspirin 325 MG tablet Take 325 mg by mouth daily.   atorvastatin 10 MG tablet Commonly known as: LIPITOR Take 10 mg by mouth daily.   cyanocobalamin 1000 MCG/ML injection Commonly known as: (VITAMIN B-12) Inject 1,000 mcg into the muscle every 30 (thirty) days.   finasteride 5 MG tablet Commonly  known as: PROSCAR Take 5 mg by mouth daily.   gemfibrozil 600 MG tablet Commonly known as: LOPID Take 600 mg by mouth daily.   glimepiride 4 MG tablet Commonly known as: AMARYL Take 4 mg by mouth daily with breakfast.   hydroxypropyl methylcellulose / hypromellose 2.5 % ophthalmic solution Commonly known as: ISOPTO TEARS / GONIOVISC Place 1 drop into both eyes 3 (three) times daily as needed for dry eyes.   metoprolol tartrate 25 MG tablet Commonly known as: LOPRESSOR Take 25 mg by mouth daily.   omeprazole 20 MG capsule Commonly known as: PRILOSEC Take 20 mg by mouth daily.   pioglitazone 30 MG  tablet Commonly known as: ACTOS Take 30 mg by mouth daily.   SYRINGE/NEEDLE (DISP) 1 ML 25G X 5/8" 1 ML Misc Use 1 Syringe monthly.   zolpidem 12.5 MG CR tablet Commonly known as: AMBIEN CR Take 12.5 mg by mouth at bedtime.       Allergies:  Allergies  Allergen Reactions  . Bactrim [Sulfamethoxazole-Trimethoprim] Rash  . Levofloxacin Rash    Family History: Family History  Problem Relation Age of Onset  . CAD Mother   . Diabetes Mellitus II Mother   . CAD Father   . Prostate cancer Neg Hx   . Bladder Cancer Neg Hx     Social History:   reports that he has never smoked. He has never used smokeless tobacco. He reports current alcohol use. He reports that he does not use drugs.  Physical Exam: BP 135/72   Pulse (!) 58   Ht _0  (1.753 m)   Wt 185 lb (83.9 kg)   BMI 27.32 kg/m   Constitutional:  Alert and oriented, no acute distress, nontoxic appearing HEENT: Jimmy Mcintyre, AT Cardiovascular: No clubbing, cyanosis, or edema Respiratory: Normal respiratory effort, no increased work of breathing Skin: No rashes, bruises or suspicious lesions Neurologic: Grossly intact, no focal deficits, moving all 4 extremities Psychiatric: Normal mood and affect  Laboratory Data: Results for orders placed or performed in visit on 06/03/20  Microscopic Examination   Urine  Result Value Ref Range   WBC, UA None seen 0 - 5 /hpf   RBC None seen 0 - 2 /hpf   Epithelial Cells (non renal) None seen 0 - 10 /hpf   Bacteria, UA None seen None seen/Few  Urinalysis, Complete  Result Value Ref Range   Specific Gravity, UA 1.025 1.005 - 1.030   pH, UA 5.0 5.0 - 7.5   Color, UA Yellow Yellow   Appearance Ur Hazy (A) Clear   Leukocytes,UA Negative Negative   Protein,UA Negative Negative/Trace   Glucose, UA Negative Negative   Ketones, UA Negative Negative   RBC, UA Negative Negative   Bilirubin, UA Negative Negative   Urobilinogen, Ur 0.2 0.2 - 1.0 mg/dL   Nitrite, UA Negative Negative    Microscopic Examination See below:    Assessment & Plan:   1. Right flank pain UA benign today, VSS, and patient in no acute distress.  Differential includes acute stone episode versus MSK pain.  I offered him a CT stone study for further evaluation and I am starting a trial of Flomax for empiric treatment/MET while we await the results.  I counseled him to continue Tylenol as needed for pain control.  We discussed return precautions including fever, chills, nausea, vomiting, or worsening pain.  He expressed understanding. - Urinalysis, Complete - BLADDER SCAN AMB NON-IMAGING - CT RENAL STONE STUDY; Future - tamsulosin (FLOMAX) 0.4  MG CAPS capsule; Take 1 capsule (0.4 mg total) by mouth daily.  Dispense: 30 capsule; Refill: 0   Return for Will call with results.  Debroah Loop, PA-C  [facility] [address] [place], [place] [address] [phone]

## 2020-06-28 NOTE — Patient Instructions (Addendum)
I placed an order for a CT scan today for evaluate you for a possible kidney stone. The imaging department will contact you to schedule this and I will call you once I receive your results.  In the meantime, please do the following: -Take Flomax 0.4mg  daily as prescribed today -Stay well hydrated -Treat any pain with ibuprofen/tylenol   Please call our office immediately (we are open 8a-5p Monday-Friday) or go to the Emergency Department if you develop any of the following: -Fever -Chills -Nausea and/or vomiting -Pain uncontrollable with ibuprofen/tylenol

## 2020-07-15 ENCOUNTER — Ambulatory Visit
Admission: RE | Admit: 2020-07-15 | Discharge: 2020-07-15 | Disposition: A | Discharge status: Home / Self Care | Payer: Medicare Other | Source: Ambulatory Visit | Attending: Physician Assistant | Admitting: Physician Assistant

## 2020-07-15 ENCOUNTER — Other Ambulatory Visit: Payer: Self-pay

## 2020-07-15 DIAGNOSIS — R109 Unspecified abdominal pain: Secondary | ICD-10-CM | POA: Diagnosis present

## 2020-07-21 ENCOUNTER — Telehealth: Payer: Self-pay

## 2020-07-21 NOTE — Telephone Encounter (Signed)
Patient called looking for CT results

## 2020-07-22 ENCOUNTER — Other Ambulatory Visit: Payer: Self-pay | Admitting: Physician Assistant

## 2020-07-22 DIAGNOSIS — K8070 Calculus of gallbladder and bile duct without cholecystitis without obstruction: Secondary | ICD-10-CM

## 2020-07-28 ENCOUNTER — Other Ambulatory Visit
Admission: RE | Admit: 2020-07-28 | Discharge: 2020-07-28 | Disposition: A | Discharge status: Home / Self Care | Payer: Medicare Other | Attending: Surgery | Admitting: Surgery

## 2020-07-28 ENCOUNTER — Telehealth: Payer: Self-pay

## 2020-07-28 ENCOUNTER — Other Ambulatory Visit: Payer: Self-pay

## 2020-07-28 DIAGNOSIS — K8040 Calculus of bile duct with cholecystitis, unspecified, without obstruction: Secondary | ICD-10-CM | POA: Diagnosis present

## 2020-07-28 LAB — HEPATIC FUNCTION PANEL
ALT: 25 U/L (ref 0–44)
AST: 22 U/L (ref 15–41)
Albumin: 4.1 g/dL (ref 3.5–5.0)
Alkaline Phosphatase: 68 U/L (ref 38–126)
Bilirubin, Direct: <0.1 mg/dL (ref 0.0–0.2)
Total Bilirubin: 0.5 mg/dL (ref 0.3–1.2)
Total Protein: 7 g/dL (ref 6.5–8.1)

## 2020-07-28 NOTE — Telephone Encounter (Signed)
-----   Message from Olean Ree, MD sent at 07/28/2020 10:33 AM EDT ----- Regarding: appointment tomorrow Hi,  This patient is on my clinic schedule for tomorrow for cholelithiasis, but on his CT scan, he also has common bile duct stones.  Can y'all please order a Hepatic function panel for him today or tomorrow before the appointment.  Need to make sure we dont need to send him to hospital instead.  Thanks,  [employer]

## 2020-07-28 NOTE — Addendum Note (Signed)
Addended by: Lesly Rubenstein on: 07/28/2020 12:01 PM   Modules accepted: Orders

## 2020-07-28 NOTE — Telephone Encounter (Signed)
Spoke with the the patient and he will have his lab work done today and will follow up tomorrow.

## 2020-07-28 NOTE — Progress Notes (Signed)
.  h 

## 2020-07-29 ENCOUNTER — Other Ambulatory Visit: Payer: Self-pay

## 2020-07-29 ENCOUNTER — Ambulatory Visit (INDEPENDENT_AMBULATORY_CARE_PROVIDER_SITE_OTHER): Payer: Medicare Other | Admitting: Surgery

## 2020-07-29 ENCOUNTER — Encounter: Payer: Self-pay | Admitting: Gastroenterology

## 2020-07-29 ENCOUNTER — Telehealth: Payer: Self-pay

## 2020-07-29 ENCOUNTER — Encounter: Payer: Self-pay | Admitting: Surgery

## 2020-07-29 VITALS — BP 140/68 | HR 54 | Temp 98.4°F | Ht 69.0 in | Wt 188.0 lb

## 2020-07-29 DIAGNOSIS — K8070 Calculus of gallbladder and bile duct without cholecystitis without obstruction: Secondary | ICD-10-CM | POA: Diagnosis not present

## 2020-07-29 DIAGNOSIS — K8000 Calculus of gallbladder with acute cholecystitis without obstruction: Secondary | ICD-10-CM

## 2020-07-29 NOTE — Progress Notes (Signed)
07/29/2020  Reason for Visit:  Choledocholithiasis with cholelithiasis  Referring Provider:  Debroah Loop, PA-C  History of Present Illness: Jimmy Mcintyre is a 75 y.o. male presenting for evaluation of cholelithiasis with choledocholithiasis.  Patient was admitted initially seen by me more than 3 years ago for cholelithiasis but the patient did not want surgery at the time.  He has a history of prostate cancer and is being followed by Dr. Donella Stade as well as a history of kidney stones and had recently seen Ms. Vaillancourt with urology on 06/28/2020 for right flank pain.  His urinalysis was clear and a CT scan of the abdomen pelvis was obtained on 07/15/2020.  This did not show any nephrolithiasis but did show again cholelithiasis with a new finding of choledocholithiasis with common bile duct dilation to 12 mm.  He was referred to [place] for further evaluation.  I obtained a set of LFTs yesterday which showed all normal labs with a total bilirubin of 0.5, AST 22, ALT 25, alkaline phosphatase 68.  Today, the patient reports that overall he feels an intermittent dull pain in the epigastric area after eating.  Denies any nausea or vomiting or severe pain.  He does have some pain in his back but he does report having issues with vertebral discs and is not sure if the pain is from that or from the abdominal component.  Denies any fevers, chills, chest pain, shortness of breath.  He does take a full dose aspirin with a prior history of coronary artery disease and a prior stent in the LAD.  Past Medical History: Past Medical History:  Diagnosis Date  . Abnormal prostate specific antigen 11/16/2013  . Arteriosclerosis of coronary artery 10/29/2014   Overview:  pci stent of lad   . Arthritis, degenerative 09/30/2013   Overview:   a.  Shoulders severe.   b.  Cervical spine.   c.  Lumbar spine   . Benign essential HTN 10/11/2014  . Breath shortness 09/30/2013  . Cancer ([place])    PROSTATE   . 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 Diabetes mellitus type 2, uncontrolled ([place]) 11/16/2013   TYPE 2   . Essential (primary) hypertension 10/29/2014  . GERD (gastroesophageal reflux disease)   . Glaucoma   . Obstructive apnea 04/20/2014   NO CPAP   . Sleep apnea   . TI (tricuspid incompetence) 04/29/2014     Past Surgical History: Past Surgical History:  Procedure Laterality Date  . APPENDECTOMY    . CORONARY ANGIOPLASTY WITH STENT PLACEMENT  2001  . PENILE PROSTHESIS IMPLANT  2000  . REVERSE SHOULDER ARTHROPLASTY Left 01/14/2020   Procedure: REVERSE SHOULDER ARTHROPLASTY;  Surgeon: Justice Britain, MD;  Location: [facility];  Service: Orthopedics;  Laterality: Left;  116min  . SHOULDER SURGERY Right 2014  . TONSILLECTOMY      Home Medications: Prior to Admission medications   Medication Sig Start Date End Date Taking? Authorizing Provider  acetaminophen (TYLENOL) 500 MG tablet Take 1,000 mg by mouth every 6 (six) hours as needed for moderate pain.   Yes [provider]  allopurinol (ZYLOPRIM) 300 MG tablet Take 300 mg by mouth daily.  11/07/17  Yes [provider]  amLODipine-benazepril (LOTREL) 5-20 MG per capsule Take 1 capsule by mouth daily.  11/08/13  Yes [provider]  aspirin 325 MG tablet Take 325 mg by mouth daily.    Yes [provider]  atorvastatin (LIPITOR) 10 MG tablet Take 10 mg by  mouth daily. 02/18/19  Yes [provider]  cyanocobalamin (,VITAMIN B-12,) 1000 MCG/ML injection Inject 1,000 mcg into the muscle every 30 (thirty) days.  06/23/14  Yes [provider]  finasteride (PROSCAR) 5 MG tablet Take 5 mg by mouth daily.  06/23/14  Yes [provider]  gemfibrozil (LOPID) 600 MG tablet Take 600 mg by mouth daily.    Yes [provider]  glimepiride (AMARYL) 4 MG tablet Take 4 mg by mouth daily with breakfast.  06/23/15  Yes [provider]  meloxicam (MOBIC) 15 MG tablet Take 1 tablet  by mouth daily. 07/27/20  Yes [provider]  metoprolol tartrate (LOPRESSOR) 25 MG tablet Take 25 mg by mouth daily.   Yes [provider]  omeprazole (PRILOSEC) 20 MG capsule Take 20 mg by mouth daily.  10/05/13  Yes [provider]  pioglitazone (ACTOS) 30 MG tablet Take 30 mg by mouth daily.  02/23/18  Yes [provider]  SYRINGE/NEEDLE, DISP, 1 ML 25G X 5/8" 1 ML MISC Use 1 Syringe monthly. 09/26/16  Yes [provider]  tamsulosin (FLOMAX) 0.4 MG CAPS capsule Take 1 capsule (0.4 mg total) by mouth daily. 06/28/20  Yes Vaillancourt, Aldona Bar, PA-C  Testosterone 20.25 MG/1.25GM (1.62%) GEL Place onto the skin. 06/11/20  Yes [provider]  zolpidem (AMBIEN CR) 12.5 MG CR tablet Take 12.5 mg by mouth at bedtime.  04/27/14  Yes [provider]    Allergies: Allergies  Allergen Reactions  . Bactrim [Sulfamethoxazole-Trimethoprim] Rash  . Levofloxacin Rash    Social History:  reports that he has never smoked. He has never used smokeless tobacco. He reports current alcohol use. He reports that he does not use drugs.   Family History: Family History  Problem Relation Age of Onset  . CAD Mother   . Diabetes Mellitus II Mother   . CAD Father   . Prostate cancer Neg Hx   . Bladder Cancer Neg Hx     Review of Systems: Review of Systems  Constitutional: Negative for chills and fever.  HENT: Negative for hearing loss.   Respiratory: Negative for shortness of breath.   Cardiovascular: Negative for chest pain.  Gastrointestinal: Positive for abdominal pain. Negative for constipation, diarrhea, nausea and vomiting.  Genitourinary: Negative for dysuria.  Musculoskeletal: Positive for back pain.  Skin: Negative for rash.  Neurological: Negative for dizziness.  Psychiatric/Behavioral: Negative for depression.    Physical Exam BP 140/68   Pulse (!) 54   Temp 98.4 F (36.9 C) (Oral)   Ht 5\' 9"  (1.753 m)   Wt 188 lb (85.3 kg)    SpO2 97%   BMI 27.76 kg/m  CONSTITUTIONAL: No acute distress, well-nourished HEENT:  Normocephalic, atraumatic, extraocular motion intact. NECK: Trachea is midline, and there is no jugular venous distension.  RESPIRATORY:  Lungs are clear, and breath sounds are equal bilaterally. Normal respiratory effort without pathologic use of accessory muscles. CARDIOVASCULAR: Heart is regular without murmurs, gallops, or rubs. GI: The abdomen is soft, nondistended, with mild discomfort to palpation in the epigastric area but no significant tenderness and negative Murphy's sign.  Patient has well-healed laparoscopic appendectomy scars with no incisional hernia.   MUSCULOSKELETAL:  Normal muscle strength and tone in all four extremities.  No peripheral edema or cyanosis. SKIN: Skin turgor is normal. There are no pathologic skin lesions.  NEUROLOGIC:  Motor and sensation is grossly normal.  Cranial nerves are grossly intact. PSYCH:  Alert and oriented to person, place  and time. Affect is normal.  Laboratory Analysis: Results for orders placed or performed during the hospital encounter of 07/28/20 (from the past 24 hour(s))  Hepatic function panel     Status: None   Collection Time: 07/28/20  1:48 PM  Result Value Ref Range   Total Protein 7.0 6.5 - 8.1 g/dL   Albumin 4.1 3.5 - 5.0 g/dL   AST 22 15 - 41 U/L   ALT 25 0 - 44 U/L   Alkaline Phosphatase 68 38 - 126 U/L   Total Bilirubin 0.5 0.3 - 1.2 mg/dL   Bilirubin, Direct <0.1 0.0 - 0.2 mg/dL   Indirect Bilirubin NOT CALCULATED 0.3 - 0.9 mg/dL    Imaging: CT renal stone on 07/15/2020: IMPRESSION: 1. No acute intra-abdominal or pelvic pathology. No hydronephrosis or nephrolithiasis. 2. Cholelithiasis and choledocholithiasis with mild dilatation of the CBD. MRCP may provide better evaluation of the gallbladder and bile duct. 3. Sigmoid diverticulosis. No bowel obstruction or inflammation. 4. Left inguinal hernia containing a short segment of  sigmoid colon, new since the prior CT. 5. Aortic Atherosclerosis (ICD10-I70.0).  Assessment and Plan: This is a 75 y.o. male with epigastric abdominal pain with new findings for choledocholithiasis.  - The patient's LFTs are normal which is reassuring but his CT scan 2 weeks ago does show choledocholithiasis with multiple stones in the common bile duct.  This may not be causing true obstruction but given the findings, I have placed an urgent referral to Dr. Allen Norris with gastroenterology for ERCP.  He is currently scheduled for this on 08/02/2020.  Given that his ERCP can be done next week, I discussed with the patient that we could schedule him for cholecystectomy 2 days after which would be 08/04/2020.  That way we can do everything in the same setting when he is stopping his aspirin for the prior procedure.  As a precaution we will still send cardiology clearance to make sure that he would be okay for surgery. - Discussed with the patient the role for robotic cholecystectomy with ICG cholangiogram.  Reviewed with him the risks of bleeding, infection, injury to surrounding structures, and he is willing to proceed.  Also discussed with him postoperative recovery, restrictions, and postop pain.  He is to stop his aspirin today in preparation for ERCP next week and followed by cholecystectomy 2 days later as long as there is no complications from the ERCP. -Reviewing the patient's CT scan, he does have bilateral inguinal hernias.  However he is asymptomatic from that standpoint and currently his choledocholithiasis takes precedence.  In the postop period, we could discuss further about his hernias and whether he wishes to have any procedures for that. - All questions answered and patient understands the plan of action.  Face-to-face time spent with the patient and care providers was 80 minutes, with more than 50% of the time spent counseling, educating, and coordinating care of the patient.     Melvyn Neth, [facility]

## 2020-07-29 NOTE — Telephone Encounter (Signed)
Cardiac Clearance faxed to Dr.Kenneth Fath [phone]

## 2020-07-29 NOTE — H&P (View-Only) (Signed)
07/29/2020  Reason for Visit:  Choledocholithiasis with cholelithiasis  Referring Provider:  Debroah Loop, PA-C  History of Present Illness: Jimmy Mcintyre is a 75 y.o. male presenting for evaluation of cholelithiasis with choledocholithiasis.  Patient was admitted initially seen by me more than 3 years ago for cholelithiasis but the patient did not want surgery at the time.  He has a history of prostate cancer and is being followed by Dr. Donella Stade as well as a history of kidney stones and had recently seen Ms. Vaillancourt with urology on 06/28/2020 for right flank pain.  His urinalysis was clear and a CT scan of the abdomen pelvis was obtained on 07/15/2020.  This did not show any nephrolithiasis but did show again cholelithiasis with a new finding of choledocholithiasis with common bile duct dilation to 12 mm.  He was referred to [place] for further evaluation.  I obtained a set of LFTs yesterday which showed all normal labs with a total bilirubin of 0.5, AST 22, ALT 25, alkaline phosphatase 68.  Today, the patient reports that overall he feels an intermittent dull pain in the epigastric area after eating.  Denies any nausea or vomiting or severe pain.  He does have some pain in his back but he does report having issues with vertebral discs and is not sure if the pain is from that or from the abdominal component.  Denies any fevers, chills, chest pain, shortness of breath.  He does take a full dose aspirin with a prior history of coronary artery disease and a prior stent in the LAD.  Past Medical History: Past Medical History:  Diagnosis Date  . Abnormal prostate specific antigen 11/16/2013  . Arteriosclerosis of coronary artery 10/29/2014   Overview:  pci stent of lad   . Arthritis, degenerative 09/30/2013   Overview:   a.  Shoulders severe.   b.  Cervical spine.   c.  Lumbar spine   . Benign essential HTN 10/11/2014  . Breath shortness 09/30/2013  . Cancer ([place])    PROSTATE   . 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 Diabetes mellitus type 2, uncontrolled ([place]) 11/16/2013   TYPE 2   . Essential (primary) hypertension 10/29/2014  . GERD (gastroesophageal reflux disease)   . Glaucoma   . Obstructive apnea 04/20/2014   NO CPAP   . Sleep apnea   . TI (tricuspid incompetence) 04/29/2014     Past Surgical History: Past Surgical History:  Procedure Laterality Date  . APPENDECTOMY    . CORONARY ANGIOPLASTY WITH STENT PLACEMENT  2001  . PENILE PROSTHESIS IMPLANT  2000  . REVERSE SHOULDER ARTHROPLASTY Left 01/14/2020   Procedure: REVERSE SHOULDER ARTHROPLASTY;  Surgeon: Justice Britain, MD;  Location: [facility];  Service: Orthopedics;  Laterality: Left;  176min  . SHOULDER SURGERY Right 2014  . TONSILLECTOMY      Home Medications: Prior to Admission medications   Medication Sig Start Date End Date Taking? Authorizing Provider  acetaminophen (TYLENOL) 500 MG tablet Take 1,000 mg by mouth every 6 (six) hours as needed for moderate pain.   Yes [provider]  allopurinol (ZYLOPRIM) 300 MG tablet Take 300 mg by mouth daily.  11/07/17  Yes [provider]  amLODipine-benazepril (LOTREL) 5-20 MG per capsule Take 1 capsule by mouth daily.  11/08/13  Yes [provider]  aspirin 325 MG tablet Take 325 mg by mouth daily.    Yes [provider]  atorvastatin (LIPITOR) 10 MG tablet Take 10 mg by  mouth daily. 02/18/19  Yes [provider]  cyanocobalamin (,VITAMIN B-12,) 1000 MCG/ML injection Inject 1,000 mcg into the muscle every 30 (thirty) days.  06/23/14  Yes [provider]  finasteride (PROSCAR) 5 MG tablet Take 5 mg by mouth daily.  06/23/14  Yes [provider]  gemfibrozil (LOPID) 600 MG tablet Take 600 mg by mouth daily.    Yes [provider]  glimepiride (AMARYL) 4 MG tablet Take 4 mg by mouth daily with breakfast.  06/23/15  Yes [provider]  meloxicam (MOBIC) 15 MG tablet Take 1 tablet  by mouth daily. 07/27/20  Yes [provider]  metoprolol tartrate (LOPRESSOR) 25 MG tablet Take 25 mg by mouth daily.   Yes [provider]  omeprazole (PRILOSEC) 20 MG capsule Take 20 mg by mouth daily.  10/05/13  Yes [provider]  pioglitazone (ACTOS) 30 MG tablet Take 30 mg by mouth daily.  02/23/18  Yes [provider]  SYRINGE/NEEDLE, DISP, 1 ML 25G X 5/8" 1 ML MISC Use 1 Syringe monthly. 09/26/16  Yes [provider]  tamsulosin (FLOMAX) 0.4 MG CAPS capsule Take 1 capsule (0.4 mg total) by mouth daily. 06/28/20  Yes Vaillancourt, Aldona Bar, PA-C  Testosterone 20.25 MG/1.25GM (1.62%) GEL Place onto the skin. 06/11/20  Yes [provider]  zolpidem (AMBIEN CR) 12.5 MG CR tablet Take 12.5 mg by mouth at bedtime.  04/27/14  Yes [provider]    Allergies: Allergies  Allergen Reactions  . Bactrim [Sulfamethoxazole-Trimethoprim] Rash  . Levofloxacin Rash    Social History:  reports that he has never smoked. He has never used smokeless tobacco. He reports current alcohol use. He reports that he does not use drugs.   Family History: Family History  Problem Relation Age of Onset  . CAD Mother   . Diabetes Mellitus II Mother   . CAD Father   . Prostate cancer Neg Hx   . Bladder Cancer Neg Hx     Review of Systems: Review of Systems  Constitutional: Negative for chills and fever.  HENT: Negative for hearing loss.   Respiratory: Negative for shortness of breath.   Cardiovascular: Negative for chest pain.  Gastrointestinal: Positive for abdominal pain. Negative for constipation, diarrhea, nausea and vomiting.  Genitourinary: Negative for dysuria.  Musculoskeletal: Positive for back pain.  Skin: Negative for rash.  Neurological: Negative for dizziness.  Psychiatric/Behavioral: Negative for depression.    Physical Exam BP 140/68   Pulse (!) 54   Temp 98.4 F (36.9 C) (Oral)   Ht 5\' 9"  (1.753 m)   Wt 188 lb (85.3 kg)    SpO2 97%   BMI 27.76 kg/m  CONSTITUTIONAL: No acute distress, well-nourished HEENT:  Normocephalic, atraumatic, extraocular motion intact. NECK: Trachea is midline, and there is no jugular venous distension.  RESPIRATORY:  Lungs are clear, and breath sounds are equal bilaterally. Normal respiratory effort without pathologic use of accessory muscles. CARDIOVASCULAR: Heart is regular without murmurs, gallops, or rubs. GI: The abdomen is soft, nondistended, with mild discomfort to palpation in the epigastric area but no significant tenderness and negative Murphy's sign.  Patient has well-healed laparoscopic appendectomy scars with no incisional hernia.   MUSCULOSKELETAL:  Normal muscle strength and tone in all four extremities.  No peripheral edema or cyanosis. SKIN: Skin turgor is normal. There are no pathologic skin lesions.  NEUROLOGIC:  Motor and sensation is grossly normal.  Cranial nerves are grossly intact. PSYCH:  Alert and oriented to person, place  and time. Affect is normal.  Laboratory Analysis: Results for orders placed or performed during the hospital encounter of 07/28/20 (from the past 24 hour(s))  Hepatic function panel     Status: None   Collection Time: 07/28/20  1:48 PM  Result Value Ref Range   Total Protein 7.0 6.5 - 8.1 g/dL   Albumin 4.1 3.5 - 5.0 g/dL   AST 22 15 - 41 U/L   ALT 25 0 - 44 U/L   Alkaline Phosphatase 68 38 - 126 U/L   Total Bilirubin 0.5 0.3 - 1.2 mg/dL   Bilirubin, Direct <0.1 0.0 - 0.2 mg/dL   Indirect Bilirubin NOT CALCULATED 0.3 - 0.9 mg/dL    Imaging: CT renal stone on 07/15/2020: IMPRESSION: 1. No acute intra-abdominal or pelvic pathology. No hydronephrosis or nephrolithiasis. 2. Cholelithiasis and choledocholithiasis with mild dilatation of the CBD. MRCP may provide better evaluation of the gallbladder and bile duct. 3. Sigmoid diverticulosis. No bowel obstruction or inflammation. 4. Left inguinal hernia containing a short segment of  sigmoid colon, new since the prior CT. 5. Aortic Atherosclerosis (ICD10-I70.0).  Assessment and Plan: This is a 75 y.o. male with epigastric abdominal pain with new findings for choledocholithiasis.  - The patient's LFTs are normal which is reassuring but his CT scan 2 weeks ago does show choledocholithiasis with multiple stones in the common bile duct.  This may not be causing true obstruction but given the findings, I have placed an urgent referral to Dr. Allen Norris with gastroenterology for ERCP.  He is currently scheduled for this on 08/02/2020.  Given that his ERCP can be done next week, I discussed with the patient that we could schedule him for cholecystectomy 2 days after which would be 08/04/2020.  That way we can do everything in the same setting when he is stopping his aspirin for the prior procedure.  As a precaution we will still send cardiology clearance to make sure that he would be okay for surgery. - Discussed with the patient the role for robotic cholecystectomy with ICG cholangiogram.  Reviewed with him the risks of bleeding, infection, injury to surrounding structures, and he is willing to proceed.  Also discussed with him postoperative recovery, restrictions, and postop pain.  He is to stop his aspirin today in preparation for ERCP next week and followed by cholecystectomy 2 days later as long as there is no complications from the ERCP. -Reviewing the patient's CT scan, he does have bilateral inguinal hernias.  However he is asymptomatic from that standpoint and currently his choledocholithiasis takes precedence.  In the postop period, we could discuss further about his hernias and whether he wishes to have any procedures for that. - All questions answered and patient understands the plan of action.  Face-to-face time spent with the patient and care providers was 80 minutes, with more than 50% of the time spent counseling, educating, and coordinating care of the patient.     Melvyn Neth, [facility]

## 2020-07-29 NOTE — Patient Instructions (Addendum)
Please stop the Aspirin today.   Cardiac Clearance faxed to Dr.kenneth Fath. ERCP scheduled with Dr.Wohl 08/02/2020- enter through the [facility] and check in at the desk. They will direct you where to go. Do not eat/drink anything after midnight Monday- Call [phone] today at 1 pm -they will let you know the arrival time for this procedure.  Our surgery scheduler will call you within 24-48 hours to schedule your surgery. Please have the [facility] surgery sheet available when speaking with her.   Minimally Invasive Cholecystectomy, Care After This sheet gives you information about how to care for yourself after your procedure. Your doctor may also give you more specific instructions. If you have problems or questions, contact your doctor. What can I expect after the procedure? After the procedure, it is common:  To have pain at the areas of surgery. You will be given medicines for pain.  To vomit or feel like you may vomit.  To feel fullness in the belly (bloating) or to have pain in the shoulder. This comes from the gas that was used during the surgery. Follow these instructions at home: Medicines  Take over-the-counter and prescription medicines only as told by your doctor.  If you were prescribed an antibiotic medicine, take it as told by your doctor. Do not stop using the antibiotic even if you start to feel better.  Ask your doctor if the medicine prescribed to you: ? Requires you to avoid driving or using machinery. ? Can cause trouble pooping (constipation). You may need to take these actions to prevent or treat trouble pooping:  Drink enough fluid to keep your pee (urine) pale yellow.  Take over-the-counter or prescription medicines.  Eat foods that are high in fiber. These include beans, whole grains, and fresh fruits and vegetables.  Limit foods that are high in fat and sugar. These include fried or sweet foods. Incision care  Follow instructions from your doctor about  how to take care of your cuts from surgery (incisions). Make sure you: ? Wash your hands with soap and water for at least 20 seconds before and after you change your bandage (dressing). If you cannot use soap and water, use hand sanitizer. ? Change your bandage as told by your doctor. ? Leave stitches (sutures), skin glue, or skin tape (adhesive) strips in place. They may need to stay in place for 2 weeks or longer. If tape strips get loose and curl up, you may trim the loose edges. Do not remove tape strips completely unless your doctor says it is okay.  Do not take baths, swim, or use a hot tub until your doctor approves. Ask your doctor if you may take showers. You may only be allowed to take sponge baths.  Check your surgery area every day for signs of infection. Check for: ? More redness, swelling, or pain. ? Fluid or blood. ? Warmth. ? Pus or a bad smell.   Activity  Rest as told by your doctor.  Do not sit for a long time without moving. Get up to take short walks every 1-2 hours. This is important. Ask for help if you feel weak or unsteady.  Do not lift anything that is heavier than 10 lb (4.5 kg), or the limit that you are told, until your doctor says that it is safe.  Do not play contact sports until your doctor says it is okay.  Do not return to work or school until your doctor says it is okay.  Return  to your normal activities as told by your doctor. Ask your doctor what activities are safe for you. General instructions  If you were given a medicine to help you relax (sedative) during your procedure, it can affect you for many hours. Do not drive or use machinery until your doctor says that it is safe.  Keep all follow-up visits as told by your doctor. This is important. Contact a doctor if:  You get a rash.  You have more redness, swelling, or pain around your cuts from surgery.  You have fluid or blood coming from your cuts from surgery.  Your cuts from surgery  feel warm to the touch.  You have pus or a bad smell coming from your cuts from surgery.  You have a fever.  One or more of your cuts from surgery breaks open. Get help right away if:  You have trouble breathing.  You have chest pain.  You have pain that is getting worse in your shoulders.  You faint or feel dizzy when you stand.  You have very bad pain in your belly (abdomen).  You feel like you may vomit or you vomit, and this lasts for more than one day.  You have leg pain. Summary  After your surgery, it is common to have pain at the areas of surgery. You may also have vomiting or fullness in the belly.  Follow your doctor's instructions about medicine, activity restrictions, and caring for your surgery areas. Do not do activities that require a lot of effort.  Contact a doctor if you have a fever or other signs of infection, such as more redness, swelling, or pain around the cuts from surgery.  Get help right away if you have chest pain, increasing pain in the shoulders, or trouble breathing. This information is not intended to replace advice given to you by your health care provider. Make sure you discuss any questions you have with your health care provider. Document Revised: 02/03/2019 Document Reviewed: 02/03/2019 Elsevier Patient Education  2021 [employer].

## 2020-08-02 ENCOUNTER — Telehealth: Payer: Self-pay | Admitting: Surgery

## 2020-08-02 ENCOUNTER — Ambulatory Visit: Payer: Medicare Other | Admitting: Anesthesiology

## 2020-08-02 ENCOUNTER — Ambulatory Visit: Payer: Medicare Other

## 2020-08-02 ENCOUNTER — Encounter
Admission: RE | Disposition: A | Discharge status: Home / Self Care | Payer: Self-pay | Source: Home / Self Care | Attending: Gastroenterology

## 2020-08-02 ENCOUNTER — Other Ambulatory Visit: Payer: Self-pay

## 2020-08-02 ENCOUNTER — Encounter: Payer: Self-pay | Admitting: Gastroenterology

## 2020-08-02 ENCOUNTER — Ambulatory Visit
Admission: RE | Admit: 2020-08-02 | Discharge: 2020-08-02 | Disposition: A | Discharge status: Home / Self Care | Payer: Medicare Other | Attending: Gastroenterology | Admitting: Gastroenterology

## 2020-08-02 DIAGNOSIS — Z8546 Personal history of malignant neoplasm of prostate: Secondary | ICD-10-CM | POA: Insufficient documentation

## 2020-08-02 DIAGNOSIS — Z791 Long term (current) use of non-steroidal anti-inflammatories (NSAID): Secondary | ICD-10-CM | POA: Insufficient documentation

## 2020-08-02 DIAGNOSIS — Z881 Allergy status to other antibiotic agents status: Secondary | ICD-10-CM | POA: Insufficient documentation

## 2020-08-02 DIAGNOSIS — Z955 Presence of coronary angioplasty implant and graft: Secondary | ICD-10-CM | POA: Diagnosis not present

## 2020-08-02 DIAGNOSIS — Z7982 Long term (current) use of aspirin: Secondary | ICD-10-CM | POA: Diagnosis not present

## 2020-08-02 DIAGNOSIS — K8000 Calculus of gallbladder with acute cholecystitis without obstruction: Secondary | ICD-10-CM

## 2020-08-02 DIAGNOSIS — K8050 Calculus of bile duct without cholangitis or cholecystitis without obstruction: Secondary | ICD-10-CM | POA: Diagnosis not present

## 2020-08-02 DIAGNOSIS — Z7984 Long term (current) use of oral hypoglycemic drugs: Secondary | ICD-10-CM | POA: Diagnosis not present

## 2020-08-02 DIAGNOSIS — Z96612 Presence of left artificial shoulder joint: Secondary | ICD-10-CM | POA: Diagnosis not present

## 2020-08-02 HISTORY — PX: ERCP: SHX5425

## 2020-08-02 HISTORY — DX: Cardiac arrhythmia, unspecified: I49.9

## 2020-08-02 LAB — GLUCOSE, CAPILLARY: Glucose-Capillary: 143 mg/dL — ABNORMAL HIGH (ref 70–99)

## 2020-08-02 SURGERY — ERCP, WITH INTERVENTION IF INDICATED
Anesthesia: General

## 2020-08-02 MED ORDER — PROPOFOL 500 MG/50ML IV EMUL
INTRAVENOUS | Status: DC | PRN
  Administered 2020-08-02: 120 ug/kg/min via INTRAVENOUS

## 2020-08-02 MED ORDER — PROPOFOL 10 MG/ML IV BOLUS
INTRAVENOUS | Status: DC | PRN
  Administered 2020-08-02 (×2): 50 mg via INTRAVENOUS

## 2020-08-02 MED ORDER — INDOMETHACIN 50 MG RE SUPP
RECTAL | Status: AC
  Filled 2020-08-02: qty 2

## 2020-08-02 MED ORDER — MIDAZOLAM HCL 5 MG/5ML IJ SOLN
INTRAMUSCULAR | Status: DC | PRN
  Administered 2020-08-02: 2 mg via INTRAVENOUS

## 2020-08-02 MED ORDER — LIDOCAINE 2% (20 MG/ML) 5 ML SYRINGE
INTRAMUSCULAR | Status: DC | PRN
  Administered 2020-08-02: 25 mg via INTRAVENOUS

## 2020-08-02 MED ORDER — GLYCOPYRROLATE 0.2 MG/ML IJ SOLN
INTRAMUSCULAR | Status: AC
  Filled 2020-08-02: qty 1

## 2020-08-02 MED ORDER — SODIUM CHLORIDE 0.9 % IV SOLN: INTRAVENOUS | Status: DC

## 2020-08-02 MED ORDER — MIDAZOLAM HCL 2 MG/2ML IJ SOLN
INTRAMUSCULAR | Status: AC
  Filled 2020-08-02: qty 2

## 2020-08-02 MED ORDER — GLYCOPYRROLATE 0.2 MG/ML IJ SOLN
INTRAMUSCULAR | Status: DC | PRN
  Administered 2020-08-02: .2 mg via INTRAVENOUS

## 2020-08-02 MED ORDER — INDOMETHACIN 50 MG RE SUPP
100.0000 mg | Freq: Once | RECTAL | Status: AC
  Administered 2020-08-02: 100 mg via RECTAL

## 2020-08-02 MED ORDER — LACTATED RINGERS IV SOLN: Freq: Once | INTRAVENOUS | Status: AC

## 2020-08-02 NOTE — Anesthesia Postprocedure Evaluation (Signed)
Anesthesia Post Note  Patient: Jimmy Mcintyre  Procedure(s) Performed: ENDOSCOPIC RETROGRADE CHOLANGIOPANCREATOGRAPHY (ERCP) (N/A )  Patient location during evaluation: Endoscopy Anesthesia Type: General Level of consciousness: awake and alert Pain management: pain level controlled Vital Signs Assessment: post-procedure vital signs reviewed and stable Respiratory status: spontaneous breathing, nonlabored ventilation, respiratory function stable and patient connected to nasal cannula oxygen Cardiovascular status: blood pressure returned to baseline and stable Postop Assessment: no apparent nausea or vomiting Anesthetic complications: no   No complications documented.   Last Vitals:  Vitals:   08/02/20 1017 08/02/20 1027  BP: 108/63   Pulse:    Resp:  16  Temp:    SpO2:      Last Pain:  Vitals:   08/02/20 1027  TempSrc:   PainSc: 0-No pain                 Precious Haws Daliah Chaudoin

## 2020-08-02 NOTE — Transfer of Care (Signed)
Immediate Anesthesia Transfer of Care Note  Patient: Jimmy Mcintyre  Procedure(s) Performed: ENDOSCOPIC RETROGRADE CHOLANGIOPANCREATOGRAPHY (ERCP) (N/A )  Patient Location: Endoscopy Unit  Anesthesia Type:General  Level of Consciousness: drowsy  Airway & Oxygen Therapy: Patient Spontanous Breathing  Post-op Assessment: Report given to RN and Post -op Vital signs reviewed and stable  Post vital signs: Reviewed  Last Vitals:  Vitals Value Taken Time  BP 97/65 08/02/20 1007  Temp    Pulse 58 08/02/20 1008  Resp 25 08/02/20 1008  SpO2 94 % 08/02/20 1008  Vitals shown include unvalidated device data.  Last Pain:  Vitals:   08/02/20 0821  TempSrc: Temporal  PainSc: 0-No pain         Complications: No complications documented.

## 2020-08-02 NOTE — H&P (Signed)
Lucilla Lame, MD [facility] [address], [place], [place] [address] Phone:[phone] Fax : [phone]  Primary Care Physician:  Idelle Crouch, MD Primary Gastroenterologist:  Dr. Allen Norris  Pre-Procedure History & Physical: HPI:  Jimmy Mcintyre is a 75 y.o. male is here for an ERCP.   Past Medical History:  Diagnosis Date  . Abnormal prostate specific antigen 11/16/2013  . Arteriosclerosis of coronary artery 10/29/2014   Overview:  pci stent of lad   . Arthritis, degenerative 09/30/2013   Overview:   a.  Shoulders severe.   b.  Cervical spine.   c.  Lumbar spine   . Benign essential HTN 10/11/2014  . Breath shortness 09/30/2013  . Cancer ([place])    PROSTATE   . 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 Diabetes mellitus type 2, uncontrolled ([place]) 11/16/2013   TYPE 2   . Dysrhythmia   . Essential (primary) hypertension 10/29/2014  . GERD (gastroesophageal reflux disease)   . Glaucoma   . Obstructive apnea 04/20/2014   NO CPAP   . Sleep apnea   . TI (tricuspid incompetence) 04/29/2014    Past Surgical History:  Procedure Laterality Date  . APPENDECTOMY    . COLONOSCOPY    . CORONARY ANGIOPLASTY WITH STENT PLACEMENT  2001  . PENILE PROSTHESIS IMPLANT  2000  . REVERSE SHOULDER ARTHROPLASTY Left 01/14/2020   Procedure: REVERSE SHOULDER ARTHROPLASTY;  Surgeon: Justice Britain, MD;  Location: [facility];  Service: Orthopedics;  Laterality: Left;  165min  . SHOULDER SURGERY Right 2014  . TONSILLECTOMY    . UPPER GI ENDOSCOPY      Prior to Admission medications   Medication Sig Start Date End Date Taking? Authorizing Provider  allopurinol (ZYLOPRIM) 300 MG tablet Take 300 mg by mouth every evening. 11/07/17  Yes [provider]  aspirin 325 MG tablet Take 325 mg by mouth in the morning.   Yes [provider]  atorvastatin (LIPITOR) 10 MG tablet Take 10 mg by mouth in the morning. 02/18/19  Yes [provider]  cyanocobalamin (,VITAMIN  B-12,) 1000 MCG/ML injection Inject 1,000 mcg into the muscle every 30 (thirty) days.  06/23/14  Yes [provider]  gemfibrozil (LOPID) 600 MG tablet Take 600 mg by mouth in the morning.   Yes [provider]  glimepiride (AMARYL) 4 MG tablet Take 4 mg by mouth daily with breakfast.  06/23/15  Yes [provider]  meloxicam (MOBIC) 15 MG tablet Take 15 mg by mouth in the morning. 07/27/20  Yes [provider]  metoprolol tartrate (LOPRESSOR) 25 MG tablet Take 25 mg by mouth in the morning.   Yes [provider]  pioglitazone (ACTOS) 30 MG tablet Take 30 mg by mouth in the morning. 02/23/18  Yes [provider]  tamsulosin (FLOMAX) 0.4 MG CAPS capsule Take 1 capsule (0.4 mg total) by mouth daily. 06/28/20  Yes Vaillancourt, Aldona Bar, PA-C  Testosterone 20.25 MG/1.25GM (1.62%) GEL Place 1 application onto the skin in the morning. Shoulders 06/11/20  Yes [provider]  zolpidem (AMBIEN CR) 12.5 MG CR tablet Take 12.5 mg by mouth at bedtime.  04/27/14  Yes [provider]  acetaminophen (TYLENOL) 500 MG tablet Take 1,000 mg by mouth every 6 (six) hours as needed for moderate pain.    [provider]  amLODipine-benazepril (LOTREL) 5-20 MG per capsule Take 1 capsule by mouth in the morning. 11/08/13   [provider]  finasteride (PROSCAR) 5 MG tablet Take  5 mg by mouth in the morning. 06/23/14   [provider]  omeprazole (PRILOSEC) 20 MG capsule Take 20 mg by mouth in the morning. 10/05/13   [provider]  SYRINGE/NEEDLE, DISP, 1 ML 25G X 5/8" 1 ML MISC Use 1 Syringe monthly. 09/26/16   [provider]    Allergies as of 07/29/2020 - Review Complete 07/29/2020  Allergen Reaction Noted  . Bactrim [sulfamethoxazole-trimethoprim] Rash 11/30/2014  . Levofloxacin Rash 04/02/2018    Family History  Problem Relation Age of Onset  . CAD Mother   . Diabetes Mellitus II Mother   . CAD Father    . Prostate cancer Neg Hx   . Bladder Cancer Neg Hx     Social History   Socioeconomic History  . Marital status: Married    Spouse name: Not on file  . Number of children: Not on file  . Years of education: Not on file  . Highest education level: Not on file  Occupational History  . Occupation: retired  Tobacco Use  . Smoking status: Never Smoker  . Smokeless tobacco: Never Used  Vaping Use  . Vaping Use: Never used  Substance and Sexual Activity  . Alcohol use: Yes    Alcohol/week: 0.0 standard drinks    Comment: drink daily bourban  . Drug use: No  . Sexual activity: Not on file  Other Topics Concern  . Not on file  Social History Narrative  . Not on file   Social Determinants of Health   Financial Resource Strain: Not on file  Food Insecurity: Not on file  Transportation Needs: Not on file  Physical Activity: Not on file  Stress: Not on file  Social Connections: Not on file  Intimate Partner Violence: Not on file    Review of Systems: See HPI, otherwise negative ROS  Physical Exam: BP 130/61   Pulse 65   Temp (!) 97.3 F (36.3 C) (Temporal)   Resp 18   Ht 5\' 9"  (1.753 m)   Wt 83.9 kg   SpO2 100%   BMI 27.32 kg/m  General:   Alert,  pleasant and cooperative in NAD Head:  Normocephalic and atraumatic. Neck:  Supple; no masses or thyromegaly. Lungs:  Clear throughout to auscultation.    Heart:  Regular rate and rhythm. Abdomen:  Soft, nontender and nondistended. Normal bowel sounds, without guarding, and without rebound.   Neurologic:  Alert and  oriented x4;  grossly normal neurologically.  Impression/Plan: Jimmy Mcintyre is here for an ERCP to be performed for CBD stone  Risks, benefits, limitations, and alternatives regarding  ERCP have been reviewed with the patient.  Questions have been answered.  All parties agreeable.   Lucilla Lame, MD  08/02/2020, 9:22 AM

## 2020-08-02 NOTE — Telephone Encounter (Signed)
Outgoing call is made, spoke with wife, Belenda Cruise.  They have been advised of Pre-Admission date/time, COVID Testing date and Surgery date.  Surgery Date: 08/04/20 Preadmission Testing Date: 08/03/20 (phone 1p-5p) Covid Testing Date: Not needed.     Patient has been made aware to call [phone], between 1-3:00pm the day before surgery, to find out what time to arrive for surgery.

## 2020-08-02 NOTE — Op Note (Signed)
[facility] Gastroenterology Patient Name: Jimmy Mcintyre Procedure Date: 08/02/2020 9:23 AM MRN: [id number] Account #: [id number] Date of Birth: 11-Jun-1945 Admit Type: Outpatient Age: 75 Room: [facility] ENDO ROOM 4 Gender: Male Note Status: Finalized Procedure:             ERCP Indications:           Common bile duct stone(s) Providers:             Lucilla Lame MD, MD Medicines:             Propofol per Anesthesia Complications:         No immediate complications. Procedure:             Pre-Anesthesia Assessment:                        - Prior to the procedure, a History and Physical was                         performed, and patient medications and allergies were                         reviewed. The patient's tolerance of previous                         anesthesia was also reviewed. The risks and benefits                         of the procedure and the sedation options and risks                         were discussed with the patient. All questions were                         answered, and informed consent was obtained. Prior                         Anticoagulants: The patient has taken no previous                         anticoagulant or antiplatelet agents. ASA Grade                         Assessment: II - A patient with mild systemic disease.                         After reviewing the risks and benefits, the patient                         was deemed in satisfactory condition to undergo the                         procedure.                        After obtaining informed consent, the scope was passed                         under direct vision. Throughout the procedure, the  patient's blood pressure, pulse, and oxygen                         saturations were monitored continuously. The [employer] D single use duodenoscope was                         introduced through the mouth, and used to  inject                         contrast into and used to inject contrast into the                         bile duct. The ERCP was accomplished without                         difficulty. The patient tolerated the procedure well. Findings:      The scout film was normal. The esophagus was successfully intubated       under direct vision. The scope was advanced to a normal major papilla in       the descending duodenum without detailed examination of the pharynx,       larynx and associated structures, and upper GI tract. The upper GI tract       was grossly normal. The bile duct was deeply cannulated with the       short-nosed traction sphincterotome. Contrast was injected. I personally       interpreted the bile duct images. There was brisk flow of contrast       through the ducts. Image quality was excellent. Contrast extended to the       entire biliary tree. The main bile duct contained multiple stones. The       main bile duct was diffusely dilated. A wire was passed into the biliary       tree. An 8 mm biliary sphincterotomy was made with a traction (standard)       sphincterotome using ERBE electrocautery. There was no       post-sphincterotomy bleeding. The biliary tree was swept with a 15 mm       balloon starting at the bifurcation. Many stones were removed. No stones       remained. Impression:            - The entire main bile duct was dilated.                        - Choledocholithiasis was found. Complete removal was                         accomplished by biliary sphincterotomy and balloon                         extraction.                        - A biliary sphincterotomy was performed.                        - The biliary tree was swept.  Recommendation:        - Discharge patient to home.                        - Clear liquid diet today.                        - Continue present medications.                        - Watch for pancreatitis, bleeding, perforation, and                          cholangitis.                        - If any fevers, chills, nausea or vomiting then go to                         the ER. Procedure Code(s):     --- Professional ---                        [phone], Endoscopic retrograde cholangiopancreatography                         (ERCP); with removal of calculi/debris from                         biliary/pancreatic duct(s)                        43262, Endoscopic retrograde cholangiopancreatography                         (ERCP); with sphincterotomy/papillotomy                        [phone], Endoscopic catheterization of the biliary                         ductal system, radiological supervision and                         interpretation Diagnosis Code(s):     --- Professional ---                        K80.50, Calculus of bile duct without cholangitis or                         cholecystitis without obstruction                        K83.8, Other specified diseases of biliary tract CPT copyright 2019 American Medical Association. All rights reserved. The codes documented in this report are preliminary and upon coder review may  be revised to meet current compliance requirements. Lucilla Lame MD, MD 08/02/2020 10:08:11 AM This report has been signed electronically. Number of Addenda: 0 Note Initiated On: 08/02/2020 9:23 AM Estimated Blood Loss:  Estimated blood loss: none.      [facility]

## 2020-08-02 NOTE — Anesthesia Preprocedure Evaluation (Signed)
Anesthesia Evaluation  Patient identified by MRN, date of birth, ID band Patient awake    Reviewed: Allergy & Precautions, NPO status , Patient's Chart, lab work & pertinent test results  History of Anesthesia Complications Negative for: history of anesthetic complications  Airway Mallampati: III  TM Distance: >3 FB Neck ROM: full    Dental  (+) Chipped   Pulmonary neg shortness of breath, sleep apnea ,    Pulmonary exam normal        Cardiovascular Exercise Tolerance: Good hypertension, (-) angina+ CAD  (-) DOE Normal cardiovascular exam     Neuro/Psych  Neuromuscular disease negative psych ROS   GI/Hepatic Neg liver ROS, GERD  Medicated and Controlled,  Endo/Other  diabetes, Type 2, Oral Hypoglycemic Agents  Renal/GU negative Renal ROS  negative genitourinary   Musculoskeletal  (+) Arthritis ,   Abdominal   Peds  Hematology negative hematology ROS (+)   Anesthesia Other Findings Patient is NPO appropriate and reports no nausea or vomiting today.  Past Medical History: 11/16/2013: Abnormal prostate specific antigen 10/29/2014: Arteriosclerosis of coronary artery     Comment:  Overview:  pci stent of lad  09/30/2013: Arthritis, degenerative     Comment:  Overview:   a.  Shoulders severe.   b.  Cervical spine.               c.  Lumbar spine  10/11/2014: Benign essential HTN 09/30/2013: Breath shortness No date: Cancer [facility])     Comment:  PROSTATE  10/29/2014: Combined fat and carbohydrate induced hyperlipemia 11/16/2013: Diabetes mellitus type 2, uncontrolled ([place])     Comment:  TYPE 2  No date: Dysrhythmia 10/29/2014: Essential (primary) hypertension No date: GERD (gastroesophageal reflux disease) No date: Glaucoma 04/20/2014: Obstructive apnea     Comment:  NO CPAP  No date: Sleep apnea 04/29/2014: TI (tricuspid incompetence)  Past Surgical History: No date: APPENDECTOMY No date: COLONOSCOPY 2001: CORONARY  ANGIOPLASTY WITH STENT PLACEMENT 2000: PENILE PROSTHESIS IMPLANT 01/14/2020: REVERSE SHOULDER ARTHROPLASTY; Left     Comment:  Procedure: REVERSE SHOULDER ARTHROPLASTY;  Surgeon:               Justice Britain, MD;  Location: [facility];  Service:               Orthopedics;  Laterality: Left;  178min 2014: SHOULDER SURGERY; Right No date: TONSILLECTOMY No date: UPPER GI ENDOSCOPY     Reproductive/Obstetrics negative OB ROS                             Anesthesia Physical Anesthesia Plan  ASA: III  Anesthesia Plan: General   Post-op Pain Management:    Induction: Intravenous  PONV Risk Score and Plan: Propofol infusion and TIVA  Airway Management Planned: Natural Airway and Nasal Cannula  Additional Equipment:   Intra-op Plan:   Post-operative Plan:   Informed Consent: I have reviewed the patients History and Physical, chart, labs and discussed the procedure including the risks, benefits and alternatives for the proposed anesthesia with the patient or authorized representative who has indicated his/her understanding and acceptance.     Dental Advisory Given  Plan Discussed with: Anesthesiologist, CRNA and Surgeon  Anesthesia Plan Comments: (Patient consented for risks of anesthesia including but not limited to:  - adverse reactions to medications - risk of airway placement if required - damage to eyes, teeth, lips or other oral mucosa - nerve damage due to positioning  -  sore throat or hoarseness - Damage to heart, brain, nerves, lungs, other parts of body or loss of life  Patient voiced understanding.)        Anesthesia Quick Evaluation

## 2020-08-03 ENCOUNTER — Encounter: Payer: Self-pay | Admitting: Gastroenterology

## 2020-08-03 ENCOUNTER — Other Ambulatory Visit: Payer: Self-pay

## 2020-08-03 ENCOUNTER — Other Ambulatory Visit
Admission: RE | Admit: 2020-08-03 | Discharge: 2020-08-03 | Disposition: A | Discharge status: Home / Self Care | Payer: Medicare Other | Source: Ambulatory Visit | Attending: Surgery | Admitting: Surgery

## 2020-08-03 ENCOUNTER — Encounter: Payer: Self-pay | Admitting: Surgery

## 2020-08-03 ENCOUNTER — Telehealth: Payer: Self-pay

## 2020-08-03 NOTE — Patient Instructions (Signed)
Your procedure is scheduled on: Thursday August 04, 2020. Report to Day Surgery inside [facility] 2nd floor (stop by admissions desk first before getting on elevator). To find out your arrival time please call [phone] between 1PM - 3PM on Wednesday August 03, 2020.  Remember: Instructions that are not followed completely may result in serious medical risk,  up to and including death, or upon the discretion of your surgeon and anesthesiologist your  surgery may need to be rescheduled.     _X__ 1. Do not eat food after midnight the night before your procedure.                 No chewing gum or hard candies. You may drink clear liquids up to 2 hours                 before you are scheduled to arrive for your surgery- DO not drink clear                 liquids within 2 hours of the start of your surgery.                 Clear Liquids include:  water, G2 or                  Gatorade Zero (avoid Red/Purple/Blue), Black Coffee or Tea (Do not add                 anything to coffee or tea).  __X__2.  On the morning of surgery brush your teeth with toothpaste and water, you                may rinse your mouth with mouthwash if you wish.  Do not swallow any toothpaste of mouthwash.     _X__ 3.  No Alcohol for 24 hours before or after surgery.   _X__ 4.  Do Not Smoke or use e-cigarettes For 24 Hours Prior to Your Surgery.                 Do not use any chewable tobacco products for at least 6 hours prior to                 Surgery.  _X__  5.  Do not use any recreational drugs (marijuana, cocaine, heroin, ecstasy, MDMA or other)                For at least one week prior to your surgery.  Combination of these drugs with anesthesia                May have life threatening results.  __X__ 6.  Notify your doctor if there is any change in your medical condition      (cold, fever, infections).     Do not wear jewelry, make-up, hairpins, clips or nail polish. Do not wear  lotions, powders, or perfumes. You may wear deodorant. Do not shave 48 hours prior to surgery. Men may shave face and neck. Do not bring valuables to the hospital.    [facility] is not responsible for any belongings or valuables.  Contacts, dentures or bridgework may not be worn into surgery. Leave your suitcase in the car. After surgery it may be brought to your room. For patients admitted to the hospital, discharge time is determined by your treatment team.   Patients discharged the day of surgery will not be allowed to drive home.   Make arrangements for  someone to be with you for the first 24 hours of your Same Day Discharge.   __X__ Take these medicines the morning of surgery with A SIP OF WATER:    1. amLODipine-benazepril (LOTREL) 5-20 MG  2. atorvastatin (LIPITOR) 10 MG   3. gemfibrozil (LOPID) 600 MG   4. metoprolol tartrate (LOPRESSOR) 25 MG   5. omeprazole (PRILOSEC) 20 MG  6. tamsulosin (FLOMAX) 0.4 MG   ____ Fleet Enema (as directed)   __X__ Use Antibacterial Soap (or wipes) as directed  ____ Use Benzoyl Peroxide Gel as instructed  ____ Use inhalers on the day of surgery  ____ Stop metformin 2 days prior to surgery    ____ Take 1/2 of usual insulin dose the night before surgery. No insulin the morning          of surgery.   ____ Call your PCP, cardiologist, or Pulmonologist if taking Coumadin/Plavix/aspirin and ask when to stop before your surgery.   __X__ One Week prior to surgery- Stop Anti-inflammatories such as Ibuprofen, Aleve, Advil, Motrin, meloxicam (MOBIC), diclofenac, etodolac, ketorolac, Toradol, Daypro, piroxicam, Goody's or BC powders. OK TO USE TYLENOL IF NEEDED   __X__ Stop supplements until after surgery.    ____ Bring C-Pap to the hospital.    If you have any questions regarding your pre-procedure instructions,  Please call Pre-admit Testing at [phone].

## 2020-08-03 NOTE — Progress Notes (Signed)
Cardiac Clearance has been received from Dr Fath's office. The patient is cleared at Low risk for surgery.  

## 2020-08-03 NOTE — Telephone Encounter (Signed)
Spoke with Amy Dr.Faths office regarding Cardiac Clearance-they will fax clearance form to Dr.Fath in [place] today to complete and fax back-

## 2020-08-04 ENCOUNTER — Ambulatory Visit: Payer: Medicare Other | Admitting: Urgent Care

## 2020-08-04 ENCOUNTER — Inpatient Hospital Stay
Admission: RE | Admit: 2020-08-04 | Discharge: 2020-08-07 | DRG: 417 | Disposition: A | Discharge status: Home / Self Care | Payer: Medicare Other | Attending: Internal Medicine | Admitting: Internal Medicine

## 2020-08-04 ENCOUNTER — Encounter
Admission: RE | Disposition: A | Discharge status: Home / Self Care | Payer: Self-pay | Source: Home / Self Care | Attending: Internal Medicine

## 2020-08-04 ENCOUNTER — Ambulatory Visit: Payer: Medicare Other

## 2020-08-04 ENCOUNTER — Encounter: Payer: Self-pay | Admitting: Surgery

## 2020-08-04 DIAGNOSIS — N1831 Chronic kidney disease, stage 3a: Secondary | ICD-10-CM | POA: Diagnosis present

## 2020-08-04 DIAGNOSIS — I2510 Atherosclerotic heart disease of native coronary artery without angina pectoris: Secondary | ICD-10-CM | POA: Diagnosis present

## 2020-08-04 DIAGNOSIS — R5082 Postprocedural fever: Secondary | ICD-10-CM | POA: Diagnosis not present

## 2020-08-04 DIAGNOSIS — E782 Mixed hyperlipidemia: Secondary | ICD-10-CM | POA: Diagnosis present

## 2020-08-04 DIAGNOSIS — K8012 Calculus of gallbladder with acute and chronic cholecystitis without obstruction: Secondary | ICD-10-CM | POA: Diagnosis not present

## 2020-08-04 DIAGNOSIS — Z791 Long term (current) use of non-steroidal anti-inflammatories (NSAID): Secondary | ICD-10-CM

## 2020-08-04 DIAGNOSIS — K8070 Calculus of gallbladder and bile duct without cholecystitis without obstruction: Principal | ICD-10-CM | POA: Diagnosis present

## 2020-08-04 DIAGNOSIS — Z7984 Long term (current) use of oral hypoglycemic drugs: Secondary | ICD-10-CM

## 2020-08-04 DIAGNOSIS — K4020 Bilateral inguinal hernia, without obstruction or gangrene, not specified as recurrent: Secondary | ICD-10-CM | POA: Diagnosis present

## 2020-08-04 DIAGNOSIS — Z955 Presence of coronary angioplasty implant and graft: Secondary | ICD-10-CM

## 2020-08-04 DIAGNOSIS — I129 Hypertensive chronic kidney disease with stage 1 through stage 4 chronic kidney disease, or unspecified chronic kidney disease: Secondary | ICD-10-CM | POA: Diagnosis present

## 2020-08-04 DIAGNOSIS — N183 Chronic kidney disease, stage 3 unspecified: Secondary | ICD-10-CM | POA: Diagnosis present

## 2020-08-04 DIAGNOSIS — Z789 Other specified health status: Secondary | ICD-10-CM

## 2020-08-04 DIAGNOSIS — M109 Gout, unspecified: Secondary | ICD-10-CM | POA: Diagnosis present

## 2020-08-04 DIAGNOSIS — R7989 Other specified abnormal findings of blood chemistry: Secondary | ICD-10-CM

## 2020-08-04 DIAGNOSIS — E291 Testicular hypofunction: Secondary | ICD-10-CM | POA: Diagnosis present

## 2020-08-04 DIAGNOSIS — K219 Gastro-esophageal reflux disease without esophagitis: Secondary | ICD-10-CM | POA: Diagnosis present

## 2020-08-04 DIAGNOSIS — Z8546 Personal history of malignant neoplasm of prostate: Secondary | ICD-10-CM

## 2020-08-04 DIAGNOSIS — R0902 Hypoxemia: Secondary | ICD-10-CM

## 2020-08-04 DIAGNOSIS — Z8249 Family history of ischemic heart disease and other diseases of the circulatory system: Secondary | ICD-10-CM

## 2020-08-04 DIAGNOSIS — Z881 Allergy status to other antibiotic agents status: Secondary | ICD-10-CM

## 2020-08-04 DIAGNOSIS — G4733 Obstructive sleep apnea (adult) (pediatric): Secondary | ICD-10-CM | POA: Diagnosis present

## 2020-08-04 DIAGNOSIS — E1129 Type 2 diabetes mellitus with other diabetic kidney complication: Secondary | ICD-10-CM | POA: Diagnosis present

## 2020-08-04 DIAGNOSIS — I10 Essential (primary) hypertension: Secondary | ICD-10-CM

## 2020-08-04 DIAGNOSIS — E1122 Type 2 diabetes mellitus with diabetic chronic kidney disease: Secondary | ICD-10-CM | POA: Diagnosis present

## 2020-08-04 DIAGNOSIS — J9601 Acute respiratory failure with hypoxia: Secondary | ICD-10-CM | POA: Diagnosis not present

## 2020-08-04 DIAGNOSIS — Z7982 Long term (current) use of aspirin: Secondary | ICD-10-CM

## 2020-08-04 DIAGNOSIS — Z96611 Presence of right artificial shoulder joint: Secondary | ICD-10-CM | POA: Diagnosis present

## 2020-08-04 DIAGNOSIS — Z833 Family history of diabetes mellitus: Secondary | ICD-10-CM

## 2020-08-04 DIAGNOSIS — J9811 Atelectasis: Secondary | ICD-10-CM | POA: Diagnosis not present

## 2020-08-04 DIAGNOSIS — Z7289 Other problems related to lifestyle: Secondary | ICD-10-CM

## 2020-08-04 DIAGNOSIS — I441 Atrioventricular block, second degree: Secondary | ICD-10-CM | POA: Diagnosis present

## 2020-08-04 DIAGNOSIS — Z20822 Contact with and (suspected) exposure to covid-19: Secondary | ICD-10-CM | POA: Diagnosis present

## 2020-08-04 DIAGNOSIS — D72829 Elevated white blood cell count, unspecified: Secondary | ICD-10-CM | POA: Diagnosis not present

## 2020-08-04 DIAGNOSIS — Z79899 Other long term (current) drug therapy: Secondary | ICD-10-CM

## 2020-08-04 HISTORY — DX: Personal history of other diseases of the circulatory system: Z86.79

## 2020-08-04 LAB — D-DIMER, QUANTITATIVE: D-Dimer, Quant: 0.6 ug/mL-FEU — ABNORMAL HIGH (ref 0.00–0.50)

## 2020-08-04 LAB — COMPREHENSIVE METABOLIC PANEL
ALT: 33 U/L (ref 0–44)
AST: 43 U/L — ABNORMAL HIGH (ref 15–41)
Albumin: 3.9 g/dL (ref 3.5–5.0)
Alkaline Phosphatase: 62 U/L (ref 38–126)
Anion gap: 11 (ref 5–15)
BUN: 23 mg/dL (ref 8–23)
CO2: 22 mmol/L (ref 22–32)
Calcium: 8.9 mg/dL (ref 8.9–10.3)
Chloride: 106 mmol/L (ref 98–111)
Creatinine, Ser: 1.2 mg/dL (ref 0.61–1.24)
GFR, Estimated: >60 mL/min (ref >60)
Glucose, Bld: 206 mg/dL — ABNORMAL HIGH (ref 70–99)
Potassium: 4.3 mmol/L (ref 3.5–5.1)
Sodium: 139 mmol/L (ref 135–145)
Total Bilirubin: 0.6 mg/dL (ref 0.3–1.2)
Total Protein: 6.9 g/dL (ref 6.5–8.1)

## 2020-08-04 LAB — CBC
HCT: 43.1 % (ref 39.0–52.0)
Hemoglobin: 14.1 g/dL (ref 13.0–17.0)
MCH: 29 pg (ref 26.0–34.0)
MCHC: 32.7 g/dL (ref 30.0–36.0)
MCV: 88.7 fL (ref 80.0–100.0)
Platelets: 201 10*3/uL (ref 150–400)
RBC: 4.86 MIL/uL (ref 4.22–5.81)
RDW: 15 % (ref 11.5–15.5)
WBC: 18 10*3/uL — ABNORMAL HIGH (ref 4.0–10.5)
nRBC: 0 % (ref 0.0–0.2)

## 2020-08-04 LAB — GLUCOSE, CAPILLARY
Glucose-Capillary: 126 mg/dL — ABNORMAL HIGH (ref 70–99)
Glucose-Capillary: 174 mg/dL — ABNORMAL HIGH (ref 70–99)
Glucose-Capillary: 180 mg/dL — ABNORMAL HIGH (ref 70–99)
Glucose-Capillary: 209 mg/dL — ABNORMAL HIGH (ref 70–99)

## 2020-08-04 LAB — LIPASE, BLOOD: Lipase: 23 U/L (ref 11–51)

## 2020-08-04 LAB — RESP PANEL BY RT-PCR (FLU A&B, COVID) ARPGX2
Influenza A by PCR: NEGATIVE
Influenza B by PCR: NEGATIVE
SARS Coronavirus 2 by RT PCR: NEGATIVE

## 2020-08-04 LAB — BRAIN NATRIURETIC PEPTIDE: B Natriuretic Peptide: 105.7 pg/mL — ABNORMAL HIGH (ref 0.0–100.0)

## 2020-08-04 SURGERY — CHOLECYSTECTOMY, ROBOT-ASSISTED, LAPAROSCOPIC
Anesthesia: General

## 2020-08-04 MED ORDER — AMLODIPINE BESYLATE 5 MG PO TABS
5.0000 mg | ORAL_TABLET | Freq: Every day | ORAL | Status: DC
  Administered 2020-08-05 – 2020-08-07 (×3): 5 mg via ORAL
  Filled 2020-08-04 (×3): qty 1

## 2020-08-04 MED ORDER — INSULIN ASPART 100 UNIT/ML IJ SOLN
0.0000 [IU] | Freq: Three times a day (TID) | INTRAMUSCULAR | Status: DC
  Administered 2020-08-05 – 2020-08-06 (×3): 1 [IU] via SUBCUTANEOUS
  Administered 2020-08-06: 2 [IU] via SUBCUTANEOUS
  Administered 2020-08-06 – 2020-08-07 (×2): 1 [IU] via SUBCUTANEOUS
  Filled 2020-08-04 (×6): qty 1

## 2020-08-04 MED ORDER — CHLORHEXIDINE GLUCONATE 0.12 % MT SOLN
OROMUCOSAL | Status: AC
  Administered 2020-08-04: 15 mL via OROMUCOSAL
  Filled 2020-08-04: qty 15

## 2020-08-04 MED ORDER — BUPIVACAINE-EPINEPHRINE (PF) 0.25% -1:200000 IJ SOLN
INTRAMUSCULAR | Status: DC | PRN
  Administered 2020-08-04: 30 mL

## 2020-08-04 MED ORDER — LORAZEPAM 2 MG/ML IJ SOLN: 1.0000 mg | INTRAMUSCULAR | Status: DC | PRN

## 2020-08-04 MED ORDER — ACETAMINOPHEN 500 MG PO TABS: 1000.0000 mg | ORAL_TABLET | ORAL | Status: AC

## 2020-08-04 MED ORDER — LORAZEPAM 2 MG/ML IJ SOLN: 0.0000 mg | Freq: Two times a day (BID) | INTRAMUSCULAR | Status: DC

## 2020-08-04 MED ORDER — MIDAZOLAM HCL 2 MG/2ML IJ SOLN
INTRAMUSCULAR | Status: DC | PRN
  Administered 2020-08-04: 2 mg via INTRAVENOUS

## 2020-08-04 MED ORDER — PROPOFOL 10 MG/ML IV BOLUS
INTRAVENOUS | Status: DC | PRN
  Administered 2020-08-04: 150 mg via INTRAVENOUS

## 2020-08-04 MED ORDER — IPRATROPIUM-ALBUTEROL 0.5-2.5 (3) MG/3ML IN SOLN
3.0000 mL | RESPIRATORY_TRACT | Status: AC
  Administered 2020-08-04: 3 mL via RESPIRATORY_TRACT

## 2020-08-04 MED ORDER — ACETAMINOPHEN 325 MG PO TABS
650.0000 mg | ORAL_TABLET | Freq: Four times a day (QID) | ORAL | Status: DC | PRN
  Administered 2020-08-05 – 2020-08-07 (×3): 650 mg via ORAL
  Filled 2020-08-04 (×3): qty 2

## 2020-08-04 MED ORDER — HYDROCODONE-ACETAMINOPHEN 7.5-325 MG PO TABS
ORAL_TABLET | ORAL | Status: AC
  Filled 2020-08-04: qty 1

## 2020-08-04 MED ORDER — SODIUM CHLORIDE 0.9 % IV SOLN: INTRAVENOUS | Status: DC

## 2020-08-04 MED ORDER — ONDANSETRON HCL 4 MG/2ML IJ SOLN: 4.0000 mg | Freq: Three times a day (TID) | INTRAMUSCULAR | Status: DC | PRN

## 2020-08-04 MED ORDER — FENTANYL CITRATE (PF) 100 MCG/2ML IJ SOLN
INTRAMUSCULAR | Status: DC | PRN
  Administered 2020-08-04 (×4): 50 ug via INTRAVENOUS

## 2020-08-04 MED ORDER — CHLORHEXIDINE GLUCONATE CLOTH 2 % EX PADS: 6.0000 | MEDICATED_PAD | Freq: Once | CUTANEOUS | Status: DC

## 2020-08-04 MED ORDER — PHENYLEPHRINE HCL (PRESSORS) 10 MG/ML IV SOLN
INTRAVENOUS | Status: DC | PRN
  Administered 2020-08-04 (×2): 100 ug via INTRAVENOUS

## 2020-08-04 MED ORDER — THIAMINE HCL 100 MG/ML IJ SOLN: 100.0000 mg | Freq: Every day | INTRAMUSCULAR | Status: DC

## 2020-08-04 MED ORDER — TESTOSTERONE 20.25 MG/1.25GM (1.62%) TD GEL: 1.0000 "application " | Freq: Every morning | TRANSDERMAL | Status: DC

## 2020-08-04 MED ORDER — BENAZEPRIL HCL 20 MG PO TABS
20.0000 mg | ORAL_TABLET | Freq: Every day | ORAL | Status: DC
  Administered 2020-08-05 – 2020-08-07 (×3): 20 mg via ORAL
  Filled 2020-08-04 (×4): qty 1

## 2020-08-04 MED ORDER — FAMOTIDINE 20 MG PO TABS
ORAL_TABLET | ORAL | Status: AC
  Administered 2020-08-04: 20 mg
  Filled 2020-08-04: qty 1

## 2020-08-04 MED ORDER — IPRATROPIUM BROMIDE HFA 17 MCG/ACT IN AERS: 2.0000 | INHALATION_SPRAY | RESPIRATORY_TRACT | Status: DC

## 2020-08-04 MED ORDER — SODIUM CHLORIDE 0.9 % IV SOLN
500.0000 mg | INTRAVENOUS | Status: DC
  Administered 2020-08-04 – 2020-08-05 (×2): 500 mg via INTRAVENOUS
  Filled 2020-08-04 (×3): qty 500

## 2020-08-04 MED ORDER — ACETAMINOPHEN 500 MG PO TABS
ORAL_TABLET | ORAL | Status: AC
  Administered 2020-08-04: 1000 mg via ORAL
  Filled 2020-08-04: qty 2

## 2020-08-04 MED ORDER — ZOLPIDEM TARTRATE 5 MG PO TABS
5.0000 mg | ORAL_TABLET | Freq: Every evening | ORAL | Status: DC | PRN
  Administered 2020-08-04 – 2020-08-06 (×3): 5 mg via ORAL
  Filled 2020-08-04 (×3): qty 1

## 2020-08-04 MED ORDER — ALBUTEROL SULFATE (2.5 MG/3ML) 0.083% IN NEBU
2.5000 mg | INHALATION_SOLUTION | Freq: Four times a day (QID) | RESPIRATORY_TRACT | Status: DC | PRN
  Administered 2020-08-05: 2.5 mg via RESPIRATORY_TRACT
  Filled 2020-08-04: qty 3

## 2020-08-04 MED ORDER — OXYCODONE HCL 5 MG PO TABS: 5.0000 mg | ORAL_TABLET | ORAL | 0 refills | Status: DC | PRN

## 2020-08-04 MED ORDER — LORAZEPAM 1 MG PO TABS: 1.0000 mg | ORAL_TABLET | ORAL | Status: DC | PRN

## 2020-08-04 MED ORDER — SUGAMMADEX SODIUM 500 MG/5ML IV SOLN
INTRAVENOUS | Status: DC | PRN
  Administered 2020-08-04: 400 mg via INTRAVENOUS

## 2020-08-04 MED ORDER — TAMSULOSIN HCL 0.4 MG PO CAPS
0.4000 mg | ORAL_CAPSULE | Freq: Every day | ORAL | Status: DC
  Administered 2020-08-04 – 2020-08-07 (×4): 0.4 mg via ORAL
  Filled 2020-08-04 (×4): qty 1

## 2020-08-04 MED ORDER — FENTANYL CITRATE (PF) 100 MCG/2ML IJ SOLN
INTRAMUSCULAR | Status: AC
  Filled 2020-08-04: qty 2

## 2020-08-04 MED ORDER — BUPIVACAINE LIPOSOME 1.3 % IJ SUSP: 20.0000 mL | Freq: Once | INTRAMUSCULAR | Status: DC

## 2020-08-04 MED ORDER — ACETAMINOPHEN 500 MG PO TABS: 1000.0000 mg | ORAL_TABLET | Freq: Four times a day (QID) | ORAL | Status: DC | PRN

## 2020-08-04 MED ORDER — FINASTERIDE 5 MG PO TABS
5.0000 mg | ORAL_TABLET | Freq: Every morning | ORAL | Status: DC
  Administered 2020-08-05 – 2020-08-07 (×3): 5 mg via ORAL
  Filled 2020-08-04 (×3): qty 1

## 2020-08-04 MED ORDER — HYDROCODONE-ACETAMINOPHEN 7.5-325 MG PO TABS
1.0000 | ORAL_TABLET | Freq: Once | ORAL | Status: AC | PRN
  Administered 2020-08-04: 1 via ORAL

## 2020-08-04 MED ORDER — ACETAMINOPHEN 325 MG PO TABS: 325.0000 mg | ORAL_TABLET | ORAL | Status: DC | PRN

## 2020-08-04 MED ORDER — ATORVASTATIN CALCIUM 10 MG PO TABS
10.0000 mg | ORAL_TABLET | Freq: Every morning | ORAL | Status: DC
  Administered 2020-08-05 – 2020-08-07 (×3): 10 mg via ORAL
  Filled 2020-08-04 (×3): qty 1

## 2020-08-04 MED ORDER — CEFAZOLIN SODIUM-DEXTROSE 2-4 GM/100ML-% IV SOLN
2.0000 g | INTRAVENOUS | Status: AC
  Administered 2020-08-04: 2 g via INTRAVENOUS

## 2020-08-04 MED ORDER — INDOCYANINE GREEN 25 MG IV SOLR
2.5000 mg | INTRAVENOUS | Status: AC
  Administered 2020-08-04: 2.5 mg via INTRAVENOUS
  Filled 2020-08-04: qty 1

## 2020-08-04 MED ORDER — HYDRALAZINE HCL 20 MG/ML IJ SOLN: 5.0000 mg | INTRAMUSCULAR | Status: DC | PRN

## 2020-08-04 MED ORDER — MIDAZOLAM HCL 2 MG/2ML IJ SOLN
INTRAMUSCULAR | Status: AC
  Filled 2020-08-04: qty 2

## 2020-08-04 MED ORDER — CHLORHEXIDINE GLUCONATE 0.12 % MT SOLN: 15.0000 mL | Freq: Once | OROMUCOSAL | Status: AC

## 2020-08-04 MED ORDER — ALLOPURINOL 300 MG PO TABS
300.0000 mg | ORAL_TABLET | Freq: Every evening | ORAL | Status: DC
  Administered 2020-08-04 – 2020-08-06 (×3): 300 mg via ORAL
  Filled 2020-08-04: qty 3
  Filled 2020-08-04: qty 1
  Filled 2020-08-04: qty 3
  Filled 2020-08-04 (×4): qty 1

## 2020-08-04 MED ORDER — PANTOPRAZOLE SODIUM 40 MG PO TBEC
40.0000 mg | DELAYED_RELEASE_TABLET | Freq: Every day | ORAL | Status: DC
  Administered 2020-08-05 – 2020-08-07 (×3): 40 mg via ORAL
  Filled 2020-08-04 (×3): qty 1

## 2020-08-04 MED ORDER — ORAL CARE MOUTH RINSE: 15.0000 mL | Freq: Once | OROMUCOSAL | Status: AC

## 2020-08-04 MED ORDER — FOLIC ACID 1 MG PO TABS
1.0000 mg | ORAL_TABLET | Freq: Every day | ORAL | Status: DC
  Administered 2020-08-04 – 2020-08-07 (×4): 1 mg via ORAL
  Filled 2020-08-04 (×4): qty 1

## 2020-08-04 MED ORDER — GEMFIBROZIL 600 MG PO TABS
600.0000 mg | ORAL_TABLET | Freq: Every morning | ORAL | Status: DC
  Administered 2020-08-06 – 2020-08-07 (×2): 600 mg via ORAL
  Filled 2020-08-04 (×3): qty 1

## 2020-08-04 MED ORDER — CEFAZOLIN SODIUM-DEXTROSE 2-4 GM/100ML-% IV SOLN
INTRAVENOUS | Status: AC
  Filled 2020-08-04: qty 100

## 2020-08-04 MED ORDER — DEXAMETHASONE SODIUM PHOSPHATE 10 MG/ML IJ SOLN
INTRAMUSCULAR | Status: DC | PRN
  Administered 2020-08-04: 10 mg via INTRAVENOUS

## 2020-08-04 MED ORDER — GABAPENTIN 300 MG PO CAPS: 300.0000 mg | ORAL_CAPSULE | ORAL | Status: AC

## 2020-08-04 MED ORDER — GLYCOPYRROLATE 0.2 MG/ML IJ SOLN
INTRAMUSCULAR | Status: DC | PRN
  Administered 2020-08-04: .2 mg via INTRAVENOUS

## 2020-08-04 MED ORDER — ROCURONIUM BROMIDE 100 MG/10ML IV SOLN
INTRAVENOUS | Status: DC | PRN
  Administered 2020-08-04: 45 mg via INTRAVENOUS
  Administered 2020-08-04: 5 mg via INTRAVENOUS
  Administered 2020-08-04: 10 mg via INTRAVENOUS
  Administered 2020-08-04: 20 mg via INTRAVENOUS

## 2020-08-04 MED ORDER — SODIUM CHLORIDE 0.9 % IV SOLN
1.0000 g | INTRAVENOUS | Status: DC
  Administered 2020-08-05 (×2): 1 g via INTRAVENOUS
  Filled 2020-08-04: qty 10
  Filled 2020-08-04: qty 1
  Filled 2020-08-04: qty 10

## 2020-08-04 MED ORDER — LIDOCAINE HCL (CARDIAC) PF 100 MG/5ML IV SOSY
PREFILLED_SYRINGE | INTRAVENOUS | Status: DC | PRN
  Administered 2020-08-04: 50 mg via INTRAVENOUS

## 2020-08-04 MED ORDER — ASPIRIN 325 MG PO TABS
325.0000 mg | ORAL_TABLET | Freq: Every morning | ORAL | Status: DC
  Administered 2020-08-06 – 2020-08-07 (×2): 325 mg via ORAL
  Filled 2020-08-04 (×3): qty 1

## 2020-08-04 MED ORDER — AMLODIPINE BESY-BENAZEPRIL HCL 5-20 MG PO CAPS: 1.0000 | ORAL_CAPSULE | Freq: Every morning | ORAL | Status: DC

## 2020-08-04 MED ORDER — ONDANSETRON HCL 4 MG/2ML IJ SOLN
INTRAMUSCULAR | Status: DC | PRN
  Administered 2020-08-04: 4 mg via INTRAVENOUS

## 2020-08-04 MED ORDER — DM-GUAIFENESIN ER 30-600 MG PO TB12: 1.0000 | ORAL_TABLET | Freq: Two times a day (BID) | ORAL | Status: DC | PRN

## 2020-08-04 MED ORDER — METOPROLOL TARTRATE 25 MG PO TABS
25.0000 mg | ORAL_TABLET | Freq: Every morning | ORAL | Status: DC
  Administered 2020-08-05 – 2020-08-07 (×3): 25 mg via ORAL
  Filled 2020-08-04 (×3): qty 1

## 2020-08-04 MED ORDER — OXYCODONE HCL 5 MG PO TABS
5.0000 mg | ORAL_TABLET | Freq: Four times a day (QID) | ORAL | Status: DC | PRN
  Administered 2020-08-05 – 2020-08-07 (×5): 5 mg via ORAL
  Filled 2020-08-04 (×5): qty 1

## 2020-08-04 MED ORDER — LORAZEPAM 2 MG/ML IJ SOLN: 0.0000 mg | Freq: Four times a day (QID) | INTRAMUSCULAR | Status: DC

## 2020-08-04 MED ORDER — IPRATROPIUM BROMIDE 0.02 % IN SOLN: 0.5000 mg | Freq: Four times a day (QID) | RESPIRATORY_TRACT | Status: DC

## 2020-08-04 MED ORDER — THIAMINE HCL 100 MG PO TABS
100.0000 mg | ORAL_TABLET | Freq: Every day | ORAL | Status: DC
  Administered 2020-08-04 – 2020-08-07 (×4): 100 mg via ORAL
  Filled 2020-08-04 (×4): qty 1

## 2020-08-04 MED ORDER — ALBUTEROL SULFATE HFA 108 (90 BASE) MCG/ACT IN AERS: 2.0000 | INHALATION_SPRAY | RESPIRATORY_TRACT | Status: DC | PRN

## 2020-08-04 MED ORDER — INSULIN ASPART 100 UNIT/ML IJ SOLN
0.0000 [IU] | Freq: Every day | INTRAMUSCULAR | Status: DC
  Administered 2020-08-04: 2 [IU] via SUBCUTANEOUS
  Filled 2020-08-04: qty 1

## 2020-08-04 MED ORDER — ADULT MULTIVITAMIN W/MINERALS CH
1.0000 | ORAL_TABLET | Freq: Every day | ORAL | Status: DC
  Administered 2020-08-04 – 2020-08-07 (×4): 1 via ORAL
  Filled 2020-08-04 (×4): qty 1

## 2020-08-04 MED ORDER — SUCCINYLCHOLINE CHLORIDE 20 MG/ML IJ SOLN
INTRAMUSCULAR | Status: DC | PRN
  Administered 2020-08-04: 120 mg via INTRAVENOUS

## 2020-08-04 MED ORDER — GABAPENTIN 300 MG PO CAPS
ORAL_CAPSULE | ORAL | Status: AC
  Administered 2020-08-04: 300 mg via ORAL
  Filled 2020-08-04: qty 1

## 2020-08-04 MED ORDER — DROPERIDOL 2.5 MG/ML IJ SOLN
0.6250 mg | Freq: Once | INTRAMUSCULAR | Status: DC | PRN
  Filled 2020-08-04: qty 2

## 2020-08-04 MED ORDER — IPRATROPIUM-ALBUTEROL 0.5-2.5 (3) MG/3ML IN SOLN
RESPIRATORY_TRACT | Status: AC
  Filled 2020-08-04: qty 3

## 2020-08-04 MED ORDER — FENTANYL CITRATE (PF) 100 MCG/2ML IJ SOLN
25.0000 ug | INTRAMUSCULAR | Status: DC | PRN
  Administered 2020-08-04: 50 ug via INTRAVENOUS

## 2020-08-04 MED ORDER — ACETAMINOPHEN 160 MG/5ML PO SOLN
325.0000 mg | ORAL | Status: DC | PRN
  Filled 2020-08-04: qty 20.3

## 2020-08-04 MED ORDER — PROMETHAZINE HCL 25 MG/ML IJ SOLN: 6.2500 mg | INTRAMUSCULAR | Status: DC | PRN

## 2020-08-04 SURGICAL SUPPLY — 62 items
BAG INFUSER PRESSURE 100CC (MISCELLANEOUS) ×2 IMPLANT
CANNULA REDUC XI 12-8 STAPL (CANNULA) ×1
CANNULA REDUCER 12-8 DVNC XI (CANNULA) ×1 IMPLANT
CHLORAPREP W/TINT 26 (MISCELLANEOUS) ×2 IMPLANT
CLIP VESOLOCK MED LG 6/CT (CLIP) ×2 IMPLANT
COVER LIGHT HANDLE STERIS (MISCELLANEOUS) ×2 IMPLANT
COVER WAND RF STERILE (DRAPES) ×2 IMPLANT
CUP MEDICINE 2OZ PLAST GRAD ST (MISCELLANEOUS) IMPLANT
DECANTER SPIKE VIAL GLASS SM (MISCELLANEOUS) ×2 IMPLANT
DEFOGGER SCOPE WARMER CLEARIFY (MISCELLANEOUS) ×2 IMPLANT
DERMABOND ADVANCED (GAUZE/BANDAGES/DRESSINGS) ×1
DERMABOND ADVANCED .7 DNX12 (GAUZE/BANDAGES/DRESSINGS) ×1 IMPLANT
DRAPE ARM DVNC X/XI (DISPOSABLE) ×4 IMPLANT
DRAPE COLUMN DVNC XI (DISPOSABLE) ×1 IMPLANT
DRAPE DA VINCI XI ARM (DISPOSABLE) ×4
DRAPE DA VINCI XI COLUMN (DISPOSABLE) ×1
ELECT CAUTERY BLADE TIP 2.5 (TIP) ×2
ELECT REM PT RETURN 9FT ADLT (ELECTROSURGICAL) ×2
ELECTRODE CAUTERY BLDE TIP 2.5 (TIP) ×1 IMPLANT
ELECTRODE REM PT RTRN 9FT ADLT (ELECTROSURGICAL) ×1 IMPLANT
GLOVE SURG SYN 7.0 (GLOVE) ×8 IMPLANT
GLOVE SURG SYN 7.5  E (GLOVE) ×4
GLOVE SURG SYN 7.5 E (GLOVE) ×4 IMPLANT
GOWN STRL REUS W/ TWL LRG LVL3 (GOWN DISPOSABLE) ×4 IMPLANT
GOWN STRL REUS W/TWL LRG LVL3 (GOWN DISPOSABLE) ×4
IRRIGATOR SUCT 8 DISP DVNC XI (IRRIGATION / IRRIGATOR) ×1 IMPLANT
IRRIGATOR SUCTION 8MM XI DISP (IRRIGATION / IRRIGATOR) ×1
IV NS 1000ML (IV SOLUTION) ×1
IV NS 1000ML BAXH (IV SOLUTION) ×1 IMPLANT
KIT PINK PAD W/HEAD ARE REST (MISCELLANEOUS) ×2
KIT PINK PAD W/HEAD ARM REST (MISCELLANEOUS) ×1 IMPLANT
L-HOOK LAP DISP 36CM (ELECTROSURGICAL) ×2
LABEL OR SOLS (LABEL) ×2 IMPLANT
LHOOK LAP DISP 36CM (ELECTROSURGICAL) ×1 IMPLANT
MANIFOLD NEPTUNE II (INSTRUMENTS) ×2 IMPLANT
NEEDLE HYPO 22GX1.5 SAFETY (NEEDLE) ×2 IMPLANT
NS IRRIG 500ML POUR BTL (IV SOLUTION) ×2 IMPLANT
OBTURATOR OPTICAL STANDARD 8MM (TROCAR) ×1
OBTURATOR OPTICAL STND 8 DVNC (TROCAR) ×1
OBTURATOR OPTICALSTD 8 DVNC (TROCAR) ×1 IMPLANT
PACK LAP CHOLECYSTECTOMY (MISCELLANEOUS) ×2 IMPLANT
PENCIL ELECTRO HAND CTR (MISCELLANEOUS) ×2 IMPLANT
POUCH SPECIMEN RETRIEVAL 10MM (ENDOMECHANICALS) ×2 IMPLANT
RELOAD STAPLER 3.5X45 BLU DVNC (STAPLE) ×1 IMPLANT
SEAL CANN UNIV 5-8 DVNC XI (MISCELLANEOUS) ×4 IMPLANT
SEAL XI 5MM-8MM UNIVERSAL (MISCELLANEOUS) ×4
SET TUBE SMOKE EVAC HIGH FLOW (TUBING) ×2 IMPLANT
SOLUTION ELECTROLUBE (MISCELLANEOUS) ×2 IMPLANT
SPONGE LAP 18X18 RF (DISPOSABLE) IMPLANT
SPONGE LAP 4X18 RFD (DISPOSABLE) ×2 IMPLANT
STAPLER 45 DA VINCI SURE FORM (STAPLE) ×1
STAPLER 45 SUREFORM DVNC (STAPLE) ×1 IMPLANT
STAPLER CANNULA SEAL DVNC XI (STAPLE) ×1 IMPLANT
STAPLER CANNULA SEAL XI (STAPLE) ×1
STAPLER RELOAD 3.5X45 BLU DVNC (STAPLE) ×1
STAPLER RELOAD 3.5X45 BLUE (STAPLE) ×1
SUT MNCRL AB 4-0 PS2 18 (SUTURE) ×2 IMPLANT
SUT VIC AB 3-0 SH 27 (SUTURE)
SUT VIC AB 3-0 SH 27X BRD (SUTURE) IMPLANT
SUT VICRYL 0 AB UR-6 (SUTURE) ×4 IMPLANT
TAPE TRANSPORE STRL 2 31045 (GAUZE/BANDAGES/DRESSINGS) ×2 IMPLANT
TROCAR BALLN GELPORT 12X130M (ENDOMECHANICALS) ×2 IMPLANT

## 2020-08-04 NOTE — Anesthesia Preprocedure Evaluation (Addendum)
Anesthesia Evaluation  Patient identified by MRN, date of birth, ID band Patient awake    Reviewed: Allergy & Precautions, H&P , NPO status , reviewed documented beta blocker date and time   Airway Mallampati: III  TM Distance: >3 FB Neck ROM: full    Dental  (+) Caps, Teeth Intact   Pulmonary sleep apnea ,    Pulmonary exam normal        Cardiovascular hypertension, + CAD and + Cardiac Stents  Normal cardiovascular exam  2018 ECHO Study Conclusions   - Left ventricle: Systolic function was normal. The estimated  ejection fraction was in the range of 60% to 65%.  - Aortic valve: Valve area (Vmax): 2.76 cm^2.  - Mild TR  Stent 2001  Cardiac Clearance has been received from Dr Bethanne Ginger office. The patient is cleared at Low risk for surgery   Neuro/Psych  Neuromuscular disease    GI/Hepatic GERD  Medicated and Controlled,  Endo/Other  diabetes  Renal/GU      Musculoskeletal  (+) Arthritis ,   Abdominal   Peds  Hematology   Anesthesia Other Findings Past Medical History: 11/16/2013: Abnormal prostate specific antigen 10/29/2014: Arteriosclerosis of coronary artery     Comment:  Overview:  pci stent of lad  09/30/2013: Arthritis, degenerative     Comment:  Overview:   a.  Shoulders severe.   b.  Cervical spine.               c.  Lumbar spine  10/11/2014: Benign essential HTN No date: Cancer [facility])     Comment:  PROSTATE  10/29/2014: Combined fat and carbohydrate induced hyperlipemia 11/16/2013: Diabetes mellitus type 2, uncontrolled ([place]) 10/29/2014: Essential (primary) hypertension No date: GERD (gastroesophageal reflux disease) No date: Glaucoma No date: H/O Mobitz type II block     Comment:  intermittent 04/20/2014: OSA (obstructive sleep apnea)     Comment:  does not use nocturnal PAP therapy 09/30/2013: SOB (shortness of breath) 04/29/2014: TI (tricuspid incompetence) Past Surgical History: No date:  APPENDECTOMY No date: COLONOSCOPY 2001: CORONARY ANGIOPLASTY WITH STENT PLACEMENT 08/02/2020: ERCP; N/A     Comment:  Procedure: ENDOSCOPIC RETROGRADE               CHOLANGIOPANCREATOGRAPHY (ERCP);  Surgeon: Lucilla Lame,               MD;  Location: [facility] ENDOSCOPY;  Service: Endoscopy;                Laterality: N/A; 2000: PENILE PROSTHESIS IMPLANT 01/14/2020: REVERSE SHOULDER ARTHROPLASTY; Left     Comment:  Procedure: REVERSE SHOULDER ARTHROPLASTY;  Surgeon:               Justice Britain, MD;  Location: [facility] ORS;  Service:               Orthopedics;  Laterality: Left;  144min 2014: SHOULDER SURGERY; Right No date: TONSILLECTOMY No date: UPPER GI ENDOSCOPY BMI    Body Mass Index: 27.31 kg/m     Reproductive/Obstetrics                           Anesthesia Physical Anesthesia Plan  ASA: III  Anesthesia Plan: General   Post-op Pain Management:    Induction: Intravenous  PONV Risk Score and Plan: 3 and Ondansetron, Treatment may vary due to age or medical condition, Midazolam and Dexamethasone  Airway Management Planned: Oral ETT  Additional Equipment:   Intra-op Plan:  Post-operative Plan: Extubation in OR  Informed Consent: I have reviewed the patients History and Physical, chart, labs and discussed the procedure including the risks, benefits and alternatives for the proposed anesthesia with the patient or authorized representative who has indicated his/her understanding and acceptance.     Dental Advisory Given  Plan Discussed with: CRNA  Anesthesia Plan Comments:        Anesthesia Quick Evaluation

## 2020-08-04 NOTE — Progress Notes (Signed)
Patient ambulated by this RN. sats 79% RA while ambulating. Remains on 4 L [place] with sats 93 %.  Shallow breathing but unlabored.  Discussed with dr Ronelle Nigh, pt will be admitted to hospital

## 2020-08-04 NOTE — Discharge Instructions (Signed)
AMBULATORY SURGERY  DISCHARGE INSTRUCTIONS   1) The drugs that you were given will stay in your system until tomorrow so for the next 24 hours you should not:  A) Drive an automobile B) Make any legal decisions C) Drink any alcoholic beverage   2) You may resume regular meals tomorrow.  Today it is better to start with liquids and gradually work up to solid foods.  You may eat anything you prefer, but it is better to start with liquids, then soup and crackers, and gradually work up to solid foods.   3) Please notify your doctor immediately if you have any unusual bleeding, trouble breathing, redness and pain at the surgery site, drainage, fever, or pain not relieved by medication.  4) Your post-operative visit with Dr.                                     is: Date:                        Time:    Please call to schedule your post-operative visit.  5) Additional Instructions:   How to Use an Incentive Spirometer An incentive spirometer is a tool that measures how well you are filling your lungs with each breath. Learning to take long, deep breaths using this tool can help you keep your lungs clear and active. This may help to reverse or lessen your chance of developing breathing (pulmonary) problems, especially infection. You may be asked to use a spirometer:  After a surgery.  If you have a lung problem or a history of smoking.  After a long period of time when you have been unable to move or be active. If the spirometer includes an indicator to show the highest number that you have reached, your health care provider or respiratory therapist will help you set a goal. Keep a log of your progress as told by your health care provider. What are the risks?  Breathing too quickly may cause dizziness or cause you to pass out. Take your time so you do not get dizzy or light-headed.  If you are in pain, you may need to take pain medicine before doing incentive spirometry. It is harder  to take a deep breath if you are having pain. How to use your incentive spirometer 1. Sit up on the edge of your bed or on a chair. 2. Hold the incentive spirometer so that it is in an upright position. 3. Before you use the spirometer, breathe out normally. 4. Place the mouthpiece in your mouth. Make sure your lips are closed tightly around it. 5. Breathe in slowly and as deeply as you can through your mouth, causing the piston or the ball to rise toward the top of the chamber. 6. Hold your breath for 3-5 seconds, or for as long as possible. ? If the spirometer includes a coach indicator, use this to guide you in breathing. Slow down your breathing if the indicator goes above the marked areas. 7. Remove the mouthpiece from your mouth and breathe out normally. The piston or ball will return to the bottom of the chamber. 8. Rest for a few seconds, then repeat the steps 10 or more times. ? Take your time and take a few normal breaths between deep breaths so that you do not get dizzy or light-headed. ? Do this every 1-2 hours  when you are awake. 9. If the spirometer includes a goal marker to show the highest number you have reached (best effort), use this as a goal to work toward during each repetition. 10. After each set of 10 deep breaths, cough a few times. This will help to make sure that your lungs are clear. ? If you have an incision on your chest or abdomen from surgery, place a pillow or a rolled-up towel firmly against the incision when you cough. This can help to reduce pain while taking deep breaths and coughing.   General tips  When you are able to get out of bed: ? Walk around often. ? Continue to take deep breaths and cough in order to clear your lungs.  Keep using the incentive spirometer until your health care provider says it is okay to stop using it. If you have been in the hospital, you may be told to keep using the spirometer at home. Contact a health care provider if:  You  are having difficulty using the spirometer.  You have trouble using the spirometer as often as instructed.  Your pain medicine is not giving enough relief for you to use the spirometer as told.  You have a fever. Get help right away if:  You develop shortness of breath.  You develop a cough with bloody mucus from the lungs.  You have fluid or blood coming from an incision site after you cough. Summary  An incentive spirometer is a tool that can help you learn to take long, deep breaths to keep your lungs clear and active.  You may be asked to use a spirometer after a surgery, if you have a lung problem or a history of smoking, or if you have been inactive for a long period of time.  Use your incentive spirometer as instructed every 1-2 hours while you are awake.  If you have an incision on your chest or abdomen, place a pillow or a rolled-up towel firmly against your incision when you cough. This will help to reduce pain.  Get help right away if you have shortness of breath, you cough up bloody mucus, or blood comes from your incision when you cough. This information is not intended to replace advice given to you by your health care provider. Make sure you discuss any questions you have with your health care provider. Document Revised: 05/11/2019 Document Reviewed: 05/11/2019 Elsevier Patient Education  [employer].

## 2020-08-04 NOTE — H&P (Addendum)
History and Physical    Jimmy Mcintyre JHE:[id number] DOB: [birth year] DOA: 08/04/2020  Referring MD/NP/PA:   PCP: Idelle Crouch, MD   Patient coming from:  The patient is coming from home.  At baseline, pt is independent for most of ADL.        Chief Complaint: Abdominal pain  HPI: Jimmy Mcintyre is a 75 y.o. male with medical history significant of hypertension, hyperlipidemia, diabetes mellitus, GERD, gout, tricuspid valve insufficiency, OSA not on CPAP, Mobitz 2 AV block, prostate cancer, BPH, CAD with stent, hypogonadism, CKD stage IIIa, cholelithiasis, who presents with abdominal pain.  Due to right flank pain, patient was seen by urologist and had CT scan on 07/15/2020.  CT scan did not show nephrolithiasis, but did show cholelithiasis with a new finding of choledocholithiasis with common bile duct dilation to 12 mm. Pt was referred to Dr. Hampton Abbot for general surgery. Per Dr. Mont Dutton note, LFT test showed total bilirubin of 0.5, AST 22, ALT 25, alkaline phosphatase 68.   Today pt reports right upper quadrant abdominal pain, which is moderate to severe, sharp, nonradiating.  Denies nausea, vomiting, diarrhea.  No fever or chills.  Patient denies symptoms of UTI, but reports difficulty urinating.  No hematuria. He does have some pain in his back which he attributes to vertebral discs issues.  Pt underwent successful XI ROBOTIC ASSISTED LAPAROSCOPIC CHOLECYSTECTOMY with Cholangiogram by Dr. Hampton Abbot today.    After surgery, patient developed oxygen desaturation to 78% on room air, which improved to 93-100% on 4 L oxygen, then 94% on 2L nasal cannula oxygen when I saw patient in ED.  Patient is not wearing oxygen normally.  No history of COPD, CHF or asthma.  Patient denies chest pain or shortness of breath.  Patient has mild intermittent dry cough recently.  No tenderness in the calf areas.  Patient is vaccinated against COVID 19.  He also had booster shot.  Chest x-ray: 1. Low lung volumes  with streaky bibasilar opacities, which may reflect atelectasis versus pneumonia. 2. 7 cm curvilinear radiopaque density projects over the lower right chest wall, and is likely external to the patient  Review of Systems:   General: no fevers, chills, no body weight gain, has fatigue HEENT: no blurry vision, hearing changes or sore throat Respiratory: no dyspnea, has coughing, no wheezing CV: no chest pain, no palpitations GI: no nausea, vomiting, has abdominal pain, no diarrhea, constipation GU: no dysuria, burning on urination, increased urinary frequency, hematuria. Has difficulty urinating Ext: no leg edema Neuro: no unilateral weakness, numbness, or tingling, no vision change or hearing loss Skin: no rash, no skin tear. MSK: No muscle spasm, no deformity, no limitation of range of movement in spin Heme: No easy bruising.  Travel history: No recent long distant travel.  Allergy:  Allergies  Allergen Reactions  . Bactrim [Sulfamethoxazole-Trimethoprim] Rash  . Levofloxacin Rash    Past Medical History:  Diagnosis Date  . Abnormal prostate specific antigen 11/16/2013  . Arteriosclerosis of coronary artery 10/29/2014   Overview:  pci stent of lad   . Arthritis, degenerative 09/30/2013   Overview:   a.  Shoulders severe.   b.  Cervical spine.   c.  Lumbar spine   . Benign essential HTN 10/11/2014  . Cancer ([place])    PROSTATE   . 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 Diabetes mellitus type 2, uncontrolled ([place]) 11/16/2013  . Essential (primary) hypertension 10/29/2014  . GERD (gastroesophageal reflux disease)   .  Glaucoma   . H/O Mobitz type II block    intermittent  . OSA (obstructive sleep apnea) 04/20/2014   does not use nocturnal PAP therapy  . SOB (shortness of breath) 09/30/2013  . TI (tricuspid incompetence) 04/29/2014    Past Surgical History:  Procedure Laterality Date  . APPENDECTOMY    . COLONOSCOPY    . CORONARY ANGIOPLASTY WITH STENT  PLACEMENT  2001  . ERCP N/A 08/02/2020   Procedure: ENDOSCOPIC RETROGRADE CHOLANGIOPANCREATOGRAPHY (ERCP);  Surgeon: Lucilla Lame, MD;  Location: [facility] ENDOSCOPY;  Service: Endoscopy;  Laterality: N/A;  . PENILE PROSTHESIS IMPLANT  2000  . REVERSE SHOULDER ARTHROPLASTY Left 01/14/2020   Procedure: REVERSE SHOULDER ARTHROPLASTY;  Surgeon: Justice Britain, MD;  Location: [facility] ORS;  Service: Orthopedics;  Laterality: Left;  117min  . SHOULDER SURGERY Right 2014  . TONSILLECTOMY    . UPPER GI ENDOSCOPY      Social History:  reports that he has never smoked. He has never used smokeless tobacco. He reports current alcohol use. He reports that he does not use drugs.  Family History:  Family History  Problem Relation Age of Onset  . CAD Mother   . Diabetes Mellitus II Mother   . CAD Father   . Prostate cancer Neg Hx   . Bladder Cancer Neg Hx      Prior to Admission medications   Medication Sig Start Date End Date Taking? Authorizing Provider  acetaminophen (TYLENOL) 500 MG tablet Take 1,000 mg by mouth every 6 (six) hours as needed for moderate pain.   Yes [provider]  acetaminophen (TYLENOL) 500 MG tablet Take 2 tablets (1,000 mg total) by mouth every 6 (six) hours as needed for mild pain. 08/04/20  Yes Piscoya, Jacqulyn Bath, MD  allopurinol (ZYLOPRIM) 300 MG tablet Take 300 mg by mouth every evening. 11/07/17  Yes [provider]  amLODipine-benazepril (LOTREL) 5-20 MG per capsule Take 1 capsule by mouth in the morning. 11/08/13  Yes [provider]  aspirin 325 MG tablet Take 325 mg by mouth in the morning.   Yes [provider]  atorvastatin (LIPITOR) 10 MG tablet Take 10 mg by mouth in the morning. 02/18/19  Yes [provider]  cyanocobalamin (,VITAMIN B-12,) 1000 MCG/ML injection Inject 1,000 mcg into the muscle every 30 (thirty) days.  06/23/14  Yes [provider]  finasteride (PROSCAR) 5 MG tablet Take 5 mg by mouth in the morning. 06/23/14  Yes  [provider]  gemfibrozil (LOPID) 600 MG tablet Take 600 mg by mouth in the morning.   Yes [provider]  glimepiride (AMARYL) 4 MG tablet Take 4 mg by mouth daily with breakfast.  06/23/15  Yes [provider]  meloxicam (MOBIC) 15 MG tablet Take 15 mg by mouth in the morning. 07/27/20  Yes [provider]  metoprolol tartrate (LOPRESSOR) 25 MG tablet Take 25 mg by mouth in the morning.   Yes [provider]  omeprazole (PRILOSEC) 20 MG capsule Take 20 mg by mouth in the morning. 10/05/13  Yes [provider]  oxyCODONE (OXY IR/ROXICODONE) 5 MG immediate release tablet Take 1 tablet (5 mg total) by mouth every 4 (four) hours as needed for severe pain. 08/04/20  Yes Piscoya, Jacqulyn Bath, MD  pioglitazone (ACTOS) 30 MG tablet Take 30 mg by mouth in the morning. 02/23/18  Yes [provider]  SYRINGE/NEEDLE, DISP, 1 ML 25G X 5/8" 1 ML MISC Use 1 Syringe monthly. 09/26/16  Yes [provider]  Testosterone 20.25 MG/1.25GM (1.62%) GEL Place 1 application onto the skin in the morning. Shoulders 06/11/20  Yes [provider]  zolpidem (AMBIEN CR) 12.5 MG CR tablet Take 12.5 mg by mouth at bedtime.  04/27/14  Yes [provider]  tamsulosin (FLOMAX) 0.4 MG CAPS capsule Take 1 capsule (0.4 mg total) by mouth daily. 06/28/20   Debroah Loop, PA-C    Physical Exam: Vitals:   08/04/20 1746 08/04/20 1812 08/04/20 1815 08/04/20 1830  BP:   (!) 178/63 136/66  Pulse:   85 82  Resp:   (!) 26 (!) 22  Temp:      TempSrc:      SpO2: 92% (!) 89% 91% 93%  Weight:      Height:       General: Not in acute distress HEENT:       Eyes: PERRL, EOMI, no scleral icterus.       ENT: No discharge from the ears and nose, no pharynx injection, no tonsillar enlargement.        Neck: No JVD, no bruit, no mass felt. Heme: No neck lymph node enlargement. Cardiac: S1/S2, RRR, No murmurs, No gallops or rubs. Respiratory: No rales,  wheezing, rhonchi or rubs. GI: Soft, nondistended, no rebound pain, no organomegaly, BS present.  Has tenderness around the surgical sites.  Has tenderness in the right upper quadrant. GU: No hematuria Ext: No pitting leg edema bilaterally. 1+DP/PT pulse bilaterally. Musculoskeletal: No joint deformities, No joint redness or warmth, no limitation of ROM in spin. Skin: No rashes.  Neuro: Alert, oriented X3, cranial nerves II-XII grossly intact, moves all extremities normally. Psych: Patient is not psychotic, no suicidal or hemocidal ideation.  Labs on Admission: I have personally reviewed following labs and imaging studies  CBC: Recent Labs  Lab 08/04/20 1734  WBC 18.0*  HGB 14.1  HCT 43.1  MCV 88.7  PLT 387   Basic Metabolic Panel: Recent Labs  Lab 08/04/20 1734  NA 139  K 4.3  CL 106  CO2 22  GLUCOSE 206*  BUN 23  CREATININE 1.20  CALCIUM 8.9   GFR: Estimated Creatinine Clearance: 52.4 mL/min (by C-G formula based on SCr of 1.2 mg/dL). Liver Function Tests: Recent Labs  Lab 08/04/20 1734  AST 43*  ALT 33  ALKPHOS 62  BILITOT 0.6  PROT 6.9  ALBUMIN 3.9   Recent Labs  Lab 08/04/20 1734  LIPASE 23   No results for input(s): AMMONIA in the last 168 hours. Coagulation Profile: No results for input(s): INR, PROTIME in the last 168 hours. Cardiac Enzymes: No results for input(s): CKTOTAL, CKMB, CKMBINDEX, TROPONINI in the last 168 hours. BNP (last 3 results) No results for input(s): PROBNP in the last 8760 hours. HbA1C: No results for input(s): HGBA1C in the last 72 hours. CBG: Recent Labs  Lab 08/02/20 0820 08/04/20 0936 08/04/20 1416 08/04/20 1721  GLUCAP 143* 126* 174* 180*   Lipid Profile: No results for input(s): CHOL, HDL, LDLCALC, TRIG, CHOLHDL, LDLDIRECT in the last 72 hours. Thyroid Function Tests: No results for input(s): TSH, T4TOTAL, FREET4, T3FREE, THYROIDAB in the last 72 hours. Anemia Panel: No results for input(s): VITAMINB12,  FOLATE, FERRITIN, TIBC, IRON, RETICCTPCT in the last 72 hours. Urine analysis:    Component Value Date/Time   COLORURINE YELLOW (A) 05/19/2015 2122   APPEARANCEUR Clear 06/28/2020 1004   LABSPEC 1.024 05/19/2015 2122   PHURINE 5.0 05/19/2015 2122   GLUCOSEU Negative 06/28/2020 [place] 05/19/2015 2122  BILIRUBINUR Negative 06/28/2020 [place] 05/19/2015 2122   PROTEINUR 1+ (A) 06/28/2020 1004   PROTEINUR NEGATIVE 05/19/2015 2122   NITRITE Negative 06/28/2020 1004   NITRITE NEGATIVE 05/19/2015 2122   LEUKOCYTESUR Negative 06/28/2020 1004   Sepsis Labs: @LABRCNTIP (procalcitonin:4,lacticidven:4) ) Recent Results (from the past 240 hour(s))  Resp Panel by RT-PCR (Flu A&B, Covid) Nasopharyngeal Swab     Status: None   Collection Time: 08/04/20  5:33 PM   Specimen: Nasopharyngeal Swab; Nasopharyngeal(NP) swabs in vial transport medium  Result Value Ref Range Status   SARS Coronavirus 2 by RT PCR NEGATIVE NEGATIVE Final    Comment: (NOTE) SARS-CoV-2 target nucleic acids are NOT DETECTED.  The SARS-CoV-2 RNA is generally detectable in upper respiratory specimens during the acute phase of infection. The lowest concentration of SARS-CoV-2 viral copies this assay can detect is 138 copies/mL. A negative result does not preclude SARS-Cov-2 infection and should not be used as the sole basis for treatment or other patient management decisions. A negative result may occur with  improper specimen collection/handling, submission of specimen other than nasopharyngeal swab, presence of viral mutation(s) within the areas targeted by this assay, and inadequate number of viral copies(<138 copies/mL). A negative result must be combined with clinical observations, patient history, and epidemiological information. The expected result is Negative.  Fact Sheet for Patients:  EntrepreneurPulse.com.au  Fact Sheet for Healthcare Providers:   IncredibleEmployment.be  This test is no t yet approved or cleared by the [place] FDA and  has been authorized for detection and/or diagnosis of SARS-CoV-2 by FDA under an Emergency Use Authorization (EUA). This EUA will remain  in effect (meaning this test can be used) for the duration of the COVID-19 declaration under Section 564(b)(1) of the Act, 21 U.S.C.section 360bbb-3(b)(1), unless the authorization is terminated  or revoked sooner.       Influenza A by PCR NEGATIVE NEGATIVE Final   Influenza B by PCR NEGATIVE NEGATIVE Final    Comment: (NOTE) The Xpert Xpress SARS-CoV-2/FLU/RSV plus assay is intended as an aid in the diagnosis of influenza from Nasopharyngeal swab specimens and should not be used as a sole basis for treatment. Nasal washings and aspirates are unacceptable for Xpert Xpress SARS-CoV-2/FLU/RSV testing.  Fact Sheet for Patients: EntrepreneurPulse.com.au  Fact Sheet for Healthcare Providers: IncredibleEmployment.be  This test is not yet approved or cleared by the [place] FDA and has been authorized for detection and/or diagnosis of SARS-CoV-2 by FDA under an Emergency Use Authorization (EUA). This EUA will remain in effect (meaning this test can be used) for the duration of the COVID-19 declaration under Section 564(b)(1) of the Act, 21 U.S.C. section 360bbb-3(b)(1), unless the authorization is terminated or revoked.  Performed at [facility], [address], [place], [place] [address]      Radiological Exams on Admission: DG Chest Portable 1 View  Result Date: 08/04/2020 CLINICAL DATA:  Hypoxia. EXAM: PORTABLE CHEST 1 VIEW COMPARISON:  Single view of the chest earlier today and 08/08/2016. FINDINGS: Mild subsegmental atelectasis appears improved. Lungs otherwise clear. Heart size normal. No pneumothorax or pleural fluid. Radiopaque density projecting over the upper right  abdomen has been removed. IMPRESSION: Mild bibasilar atelectasis. Electronically Signed   By: Inge Rise M.D.   On: 08/04/2020 15:00   DG Chest Portable 1 View  Result Date: 08/04/2020 CLINICAL DATA:  Hypoxia EXAM: PORTABLE CHEST 1 VIEW COMPARISON:  09/07/2016 FINDINGS: Stable cardiomediastinal contours. Atherosclerotic calcification of the aortic knob. Pulmonary vascular congestion. Low  lung volumes with streaky bibasilar opacities. Small left pleural effusion not excluded. No pneumothorax. Partially visualized bilateral shoulder arthroplasties. 7 cm curvilinear radiopaque density projects over the lower right chest wall, and is likely external to the patient. IMPRESSION: 1. Low lung volumes with streaky bibasilar opacities, which may reflect atelectasis versus pneumonia. 2. 7 cm curvilinear radiopaque density projects over the lower right chest wall, and is likely external to the patient. Electronically Signed   By: Davina Poke D.O.   On: 08/04/2020 14:54     EKG:  Not done in ED, will get one.   Assessment/Plan Principal Problem:   Cholelithiasis with choledocholithiasis Active Problems:   Benign essential HTN   Hypogonadism in male   Coronary artery disease   Hyperlipidemia, mixed   OSA (obstructive sleep apnea)   Acute respiratory failure with hypoxia (HCC)   Type II diabetes mellitus with renal manifestations (HCC)   GERD (gastroesophageal reflux disease)   CKD (chronic kidney disease), stage IIIa   Alcohol use   Cholelithiasis with choledocholithiasis: S/p of XI ROBOTIC ASSISTED LAPAROSCOPIC CHOLECYSTECTOMY with Cholangiogram by Dr. Hampton Abbot today.  No fever or chills.  No signs of infection. Per Dr. Hampton Abbot, patient can start eating food now   -will admit to MedSurg bed as inpatient -As needed oxycodone for pain -As needed Zofran for nausea  Acute respiratory failure with hypoxia: Etiology is not clear.  Patient has mild dry cough, no chest pain, denies shortness  breath.  No history of CHF, COPD or asthma.  Lung is clear to auscultation.  Simple explanation is atelectasis after surgery and limited respiratory effort due to abdominal pain after surgery.  Patient is using testosterone gel which increases the risk of blood clot.  Will need to rule out PE. CXR showed low lung volumes with streaky bibasilar opacities. WBC is 18. Can not completely r/o PNA. Will start Abx empirically.  -Bronchodilators -Mucinex as needed -Nasal cannula oxygen to maintain oxygen saturation above 93% -Check BNP -->105 -Follow-up D-dimer, if D-dimer is positive will get VQ scan to rule out PE -Check CBC, CMP -f/u covid 19 test -->negative -Incentive spirometry -rocephin and Azithromycin -Blood culture and sputum culture.  Benign essential HTN: -IV hydralazine as needed -Continue home Lotrel, metoprolol  Hypogonadism in male -Testosterone gel  Coronary artery disease: S/p of stent placement denies chest pain. -Aspirin, Lipitor, metoprolol  Hyperlipidemia, mixed -Lipitor  OSA (obstructive sleep apnea) -Not using CPAP now  Type II diabetes mellitus with renal manifestations ([place]): Recent A1c 6.9 on 01/05/2020.  Well-controlled.  Patient taking Actos and Amaryl -Sliding scale insulin  GERD (gastroesophageal reflux disease) -Protonix  CKD (chronic kidney disease), stage IIIa: Baseline creatinine 1.1-1.3. -Follow-up with BMP  Alcohol use: -CiWA     DVT ppx: SCD Code Status: Full code Family Communication:   Yes, patient's wife at bed side Disposition Plan:  Anticipate discharge back to previous environment Consults called: Dr. Hampton Abbot for surgery Admission status and Level of care: Med-Surg:   for obs       Status is: Observation  The patient remains OBS appropriate and will d/c before 2 midnights.  Dispo: The patient is from: Home              Anticipated d/c is to: Home              Patient currently is not medically stable to d/c.   Difficult  to place patient No          Date of Service  08/04/2020    Ivor Costa [facility]   If 7PM-7AM, please contact night-coverage [email or web address] 08/04/2020, 7:15 PM

## 2020-08-04 NOTE — Transfer of Care (Signed)
Immediate Anesthesia Transfer of Care Note  Patient: Jimmy Mcintyre  Procedure(s) Performed: XI ROBOTIC ASSISTED LAPAROSCOPIC CHOLECYSTECTOMY with Cholangiogram (N/A ) INDOCYANINE GREEN FLUORESCENCE IMAGING (ICG) cholangeogram (N/A )  Patient Location: PACU  Anesthesia Type:General  Level of Consciousness: awake, drowsy and patient cooperative  Airway & Oxygen Therapy: Patient Spontanous Breathing and Patient connected to face mask oxygen  Post-op Assessment: Report given to RN and Post -op Vital signs reviewed and stable  Post vital signs: Reviewed and stable  Last Vitals:  Vitals Value Taken Time  BP 147/69 08/04/20 1415  Temp    Pulse 65 08/04/20 1418  Resp 19 08/04/20 1418  SpO2 97 % 08/04/20 1418  Vitals shown include unvalidated device data.  Last Pain:  Vitals:   08/04/20 0938  TempSrc: Temporal  PainSc: 0-No pain         Complications: No complications documented.

## 2020-08-04 NOTE — Interval H&P Note (Signed)
History and Physical Interval Note:  08/04/2020 11:05 AM  Jimmy Mcintyre  has presented today for surgery, with the diagnosis of Calculus of bile duct with cholecystitis without obstruction.  The various methods of treatment have been discussed with the patient and family. After consideration of risks, benefits and other options for treatment, the patient has consented to  Procedure(s): XI ROBOTIC ASSISTED LAPAROSCOPIC CHOLECYSTECTOMY with Cholangiogram (N/A) INDOCYANINE GREEN FLUORESCENCE IMAGING (ICG) cholangeogram (N/A) as a surgical intervention.  The patient's history has been reviewed, patient examined, no change in status, stable for surgery.  I have reviewed the patient's chart and labs.  Questions were answered to the patient's satisfaction.     Nabil Bubolz

## 2020-08-04 NOTE — Anesthesia Procedure Notes (Signed)
Procedure Name: Intubation Performed by: Rolla Plate, CRNA Pre-anesthesia Checklist: Patient identified, Patient being monitored, Timeout performed, Emergency Drugs available and Suction available Patient Re-evaluated:Patient Re-evaluated prior to induction Oxygen Delivery Method: Circle system utilized Preoxygenation: Pre-oxygenation with 100% oxygen Induction Type: IV induction Laryngoscope Size: 4 and McGraph Grade View: Grade I Tube type: Oral Tube size: 7.0 mm Number of attempts: 1 Airway Equipment and Method: Stylet Placement Confirmation: ETT inserted through vocal cords under direct vision,  positive ETCO2 and breath sounds checked- equal and bilateral Secured at: 22 cm Tube secured with: Tape Dental Injury: Teeth and Oropharynx as per pre-operative assessment

## 2020-08-04 NOTE — Op Note (Signed)
Procedure Date:  08/04/2020  Pre-operative Diagnosis:  Choledocholithiasis and cholelithiasis  Post-operative Diagnosis:  Choledocholithiasis and cholelithiasis  Procedure:  Robotic assisted cholecystectomy with ICG FireFly cholangiogram  Surgeon:  Melvyn Neth, MD  Assistant:  Silvio Pate, PA-S  Anesthesia:  General endotracheal  Estimated Blood Loss:  30 ml  Specimens:  gallbladder  Complications:  None  Indications for Procedure:  This is a 75 y.o. male who presents with abdominal pain and workup revealing choledocholithiasis and cholelithiasis.  He underwent ERCP on 7/74 without complications and now presents for cholecystectomy.  The benefits, complications, treatment options, and expected outcomes were discussed with the patient. The risks of bleeding, infection, recurrence of symptoms, failure to resolve symptoms, bile duct damage, bile duct leak, retained common bile duct stone, bowel injury, and need for further procedures were all discussed with the patient and he was willing to proceed.  Description of Procedure: The patient was correctly identified in the preoperative area and brought into the operating room.  The patient was placed supine with VTE prophylaxis in place.  Appropriate time-outs were performed.  Anesthesia was induced and the patient was intubated.  Appropriate antibiotics were infused.  The abdomen was prepped and draped in a sterile fashion. An infraumbilical incision was made. A cutdown technique was used to enter the abdominal cavity without injury, and a 12 mm robotic port was inserted.  Pneumoperitoneum was obtained with appropriate opening pressures.  Two 8-mm ports were placed in the mid abdomen at the level of the umbilicus under direct visualization.  The right lateral aspect had adhesions from the patient's prior appendectomy.  These were taken down using electrocautery so that we could free up an area for the last port placement.  The last port  was placed in the right lateral position.  The DaVinci platform was docked, camera targeted, and instruments were placed under direct visualization.  The gallbladder was identified.  The fundus was grasped and retracted cephalad.  Adhesions were lysed bluntly and with electrocautery. The infundibulum was grasped and retracted laterally, exposing the peritoneum overlying the gallbladder.  This was incised with electrocautery and extended on either side of the gallbladder.  FireFly cholangiogram was then obtained, and we were able to clearly identify the cystic duct and common bile duct.  Both were very dilated from his recent choledocholithiasis.  The cystic artery had both anterior and posterior branches anterior to the cystic duct.  Both branches were carefully dissected using cautery and blunt dissection.  Both branches were clipped twice proximally and once distally, cutting in between.  This allowed better exposure of the cystic duct.  However, the posterior portion of the cystic duct could not be well dissected due to scarring, and thus it was decided to dissect the gallbladder first as top-down approach.  During this process, there was some bleeding from the liver bed which was controlled with cautery.  There was also spillage of bile from the gallbladder neck.  Nonetheless, the cystic duct was then more easily dissected, and it was very dilated.  A 45 mm blue load stapler was instead used to staple across the cystic duct, making sure that the cystic artery branches and clips were out of the staple line and that the common bile duct was not being included in the stapler.  The stapler was fired without complications and the gallbladder was then placed in an Endocatch bag. The liver bed was inspected and any bleeding was controlled with electrocautery. The right upper quadrant was then  inspected again revealing intact clips, no bleeding, and no ductal injury and no bile leak from the staple line.  The area was  thoroughly irrigated until clear fluid return.  The 8 mm ports were removed under direct visualization and the 12 mm port was removed.  The Endocatch bag was brought out via the umbilical incision. The fascial opening was closed using 0 vicryl suture.  Local anesthetic was infused in all incisions and the incisions were closed with 4-0 Monocryl.  The wounds were cleaned and sealed with DermaBond.  The patient was emerged from anesthesia and extubated and brought to the recovery room for further management.  The patient tolerated the procedure well and all counts were correct at the end of the case.   Melvyn Neth, MD

## 2020-08-05 ENCOUNTER — Observation Stay: Payer: Medicare Other

## 2020-08-05 ENCOUNTER — Encounter: Payer: Self-pay | Admitting: Internal Medicine

## 2020-08-05 DIAGNOSIS — K8070 Calculus of gallbladder and bile duct without cholecystitis without obstruction: Secondary | ICD-10-CM | POA: Diagnosis not present

## 2020-08-05 LAB — GLUCOSE, CAPILLARY
Glucose-Capillary: 96 mg/dL (ref 70–99)
Glucose-Capillary: 149 mg/dL — ABNORMAL HIGH (ref 70–99)
Glucose-Capillary: 145 mg/dL — ABNORMAL HIGH (ref 70–99)
Glucose-Capillary: 120 mg/dL — ABNORMAL HIGH (ref 70–99)

## 2020-08-05 LAB — BASIC METABOLIC PANEL
Anion gap: 9 (ref 5–15)
BUN: 23 mg/dL (ref 8–23)
CO2: 23 mmol/L (ref 22–32)
Calcium: 8.5 mg/dL — ABNORMAL LOW (ref 8.9–10.3)
Chloride: 107 mmol/L (ref 98–111)
Creatinine, Ser: 1.2 mg/dL (ref 0.61–1.24)
GFR, Estimated: >60 mL/min (ref >60)
Glucose, Bld: 114 mg/dL — ABNORMAL HIGH (ref 70–99)
Potassium: 4.2 mmol/L (ref 3.5–5.1)
Sodium: 139 mmol/L (ref 135–145)

## 2020-08-05 LAB — CBC
HCT: 38.5 % — ABNORMAL LOW (ref 39.0–52.0)
Hemoglobin: 12.6 g/dL — ABNORMAL LOW (ref 13.0–17.0)
MCH: 29 pg (ref 26.0–34.0)
MCHC: 32.7 g/dL (ref 30.0–36.0)
MCV: 88.7 fL (ref 80.0–100.0)
Platelets: 181 10*3/uL (ref 150–400)
RBC: 4.34 MIL/uL (ref 4.22–5.81)
RDW: 14.9 % (ref 11.5–15.5)
WBC: 18 10*3/uL — ABNORMAL HIGH (ref 4.0–10.5)
nRBC: 0 % (ref 0.0–0.2)

## 2020-08-05 LAB — STREP PNEUMONIAE URINARY ANTIGEN: Strep Pneumo Urinary Antigen: NEGATIVE

## 2020-08-05 LAB — PROCALCITONIN: Procalcitonin: 0.52 ng/mL

## 2020-08-05 MED ORDER — TECHNETIUM TO 99M ALBUMIN AGGREGATED
4.0000 | Freq: Once | INTRAVENOUS | Status: AC | PRN
  Administered 2020-08-05: 4.32 via INTRAVENOUS

## 2020-08-05 MED ORDER — IPRATROPIUM BROMIDE 0.02 % IN SOLN
0.5000 mg | Freq: Three times a day (TID) | RESPIRATORY_TRACT | Status: DC
  Filled 2020-08-05: qty 2.5

## 2020-08-05 NOTE — Care Management Obs Status (Signed)
[facility] NOTIFICATION   Patient Details  Name: Jimmy Mcintyre MRN: [id number] Date of Birth: 02/21/1946   Medicare Observation Status Notification Given:  Yes  Reviewed with patient. Printed copy for patient's records.  [place], LCSW 08/05/2020, 3:58 PM

## 2020-08-05 NOTE — Progress Notes (Signed)
PROGRESS NOTE    Jimmy Mcintyre  EVO:[id number] DOB: 1945-10-29 DOA: 08/04/2020 PCP: Idelle Crouch, MD   Brief Narrative:  75 y.o. male with medical history significant of hypertension, hyperlipidemia, diabetes mellitus, GERD, gout, tricuspid valve insufficiency, OSA not on CPAP, Mobitz 2 AV block, prostate cancer, BPH, CAD with stent, hypogonadism, CKD stage IIIa, cholelithiasis, who presents with abdominal pain.  Due to right flank pain, patient was seen by urologist and had CT scan on 07/15/2020.  CT scan did not show nephrolithiasis, but did show cholelithiasis with a new finding of choledocholithiasis with common bile duct dilation to 12 mm. Pt was referred to Dr. Hampton Abbot for general surgery. Per Dr. Mont Dutton note, LFT test showed total bilirubin of 0.5, AST 22, ALT 25, alkaline phosphatase 68.   Today pt reports right upper quadrant abdominal pain, which is moderate to severe, sharp, nonradiating.  Denies nausea, vomiting, diarrhea.  No fever or chills.  Patient denies symptoms of UTI, but reports difficulty urinating.  No hematuria. He does have some pain in his back which he attributes to vertebral discs issues.  Pt underwent successful XI ROBOTIC ASSISTED LAPAROSCOPIC CHOLECYSTECTOMY with Cholangiogram by Dr. Hampton Abbot today.    After surgery, patient developed oxygen desaturation to 78% on room air, which improved to 93-100% on 4 L oxygen.    The nature of his hypoxia is unclear.  Continue suspect atelectasis.  VQ scan and lower extremity duplex negative for VTE.  Some mild streakiness on chest x-ray most is consistent with atelectasis however procalcitonin 0.52, intermediate range   Assessment & Plan:   Principal Problem:   Cholelithiasis with choledocholithiasis Active Problems:   Benign essential HTN   Hypogonadism in male   Coronary artery disease   Hyperlipidemia, mixed   OSA (obstructive sleep apnea)   Acute respiratory failure with hypoxia (HCC)   Type II diabetes  mellitus with renal manifestations (HCC)   GERD (gastroesophageal reflux disease)   CKD (chronic kidney disease), stage IIIa   Alcohol use  Cholelithiasis with choledocholithiasis Status post lap chole with Dr. Hampton Abbot Postoperative course uncomplicated Continue as needed pain and nausea control No further surgery recommendations  Acute respiratory failure with hypoxia Possible community-acquired pneumonia Etiology is not clear VQ scan and bilateral lower extremity duplex negative Chest x-ray most consistent with bibasilar atelectasis Procalcitonin 0.52 Cannot completely rule out pneumonia We will continue Rocephin and azithromycin for now Wean down oxygen as tolerated  Benign essential HTN: -IV hydralazine as needed -Continue home Lotrel, metoprolol  Hypogonadism in male -Testosterone gel  Coronary artery disease: S/p of stent placement denies chest pain. -Aspirin, Lipitor, metoprolol  Hyperlipidemia, mixed -Lipitor  OSA (obstructive sleep apnea) -Not using CPAP now  Type II diabetes mellitus with renal manifestations ([place]): Recent A1c 6.9 on 01/05/2020.  Well-controlled.  Patient taking Actos and Amaryl -Sliding scale insulin  GERD (gastroesophageal reflux disease) -Protonix  CKD (chronic kidney disease), stage IIIa: Baseline creatinine 1.1-1.3. -Follow-up with BMP  Alcohol use: -CiWA   DVT prophylaxis: SCD Code Status: Full Family Communication: None today Disposition Plan:Status is: Observation  The patient will require care spanning > 2 midnights and should be moved to inpatient because: Inpatient level of care appropriate due to severity of illness  Dispo: The patient is from: Home              Anticipated d/c is to: Home              Patient currently is not medically stable to d/c.  Difficult to place patient No  Will attempt to wean from supplemental oxygen.  Continue to encourage incentive spirometry use.  Continue IV  Rocephin/azithromycin for community-acquired pneumonia.  Anticipated date of discharge 08/07/2018 to     Level of care: Med-Surg  Consultants:   None (surgery signed off)  Procedures:   Laparoscopic cholecystectomy  Antimicrobials:   Rocephin  Azithromycin   Subjective: Seen and examined.  Endorses some right upper quadrant pain near the surgical site.  No other complaints.  Objective: Vitals:   08/05/20 0451 08/05/20 0824 08/05/20 1126 08/05/20 1553  BP: (!) 124/58 132/60 100/71 (!) 120/57  Pulse: 69 84 70 73  Resp: 20 19 18  (!) 22  Temp: 98.8 F (37.1 C) 98.8 F (37.1 C) 98.9 F (37.2 C) 99.3 F (37.4 C)  TempSrc: Oral Oral Oral Oral  SpO2: 93% 91% 93% 90%  Weight:      Height:        Intake/Output Summary (Last 24 hours) at 08/05/2020 1617 Last data filed at 08/05/2020 1434 Gross per 24 hour  Intake 1560 ml  Output 950 ml  Net 610 ml   Filed Weights   08/04/20 0938  Weight: 83.9 kg    Examination:  General exam: Appears calm and comfortable  Respiratory system: Mild bibasilar crackles.  Normal work of breathing.  4 L Cardiovascular system: S1 & S2 heard, RRR. No JVD, murmurs, rubs, gallops or clicks. No pedal edema. Gastrointestinal system: Nondistended.  Tender to palpation right upper quadrant.  Normal bowel sounds Central nervous system: Alert and oriented. No focal neurological deficits. Extremities: Symmetric 5 x 5 power. Skin: No rashes, lesions or ulcers Psychiatry: Judgement and insight appear normal. Mood & affect appropriate.     Data Reviewed: I have personally reviewed following labs and imaging studies  CBC: Recent Labs  Lab 08/04/20 1734 08/05/20 0439  WBC 18.0* 18.0*  HGB 14.1 12.6*  HCT 43.1 38.5*  MCV 88.7 88.7  PLT 201 532   Basic Metabolic Panel: Recent Labs  Lab 08/04/20 1734 08/05/20 0439  NA 139 139  K 4.3 4.2  CL 106 107  CO2 22 23  GLUCOSE 206* 114*  BUN 23 23  CREATININE 1.20 1.20  CALCIUM 8.9 8.5*    GFR: Estimated Creatinine Clearance: 52.4 mL/min (by C-G formula based on SCr of 1.2 mg/dL). Liver Function Tests: Recent Labs  Lab 08/04/20 1734  AST 43*  ALT 33  ALKPHOS 62  BILITOT 0.6  PROT 6.9  ALBUMIN 3.9   Recent Labs  Lab 08/04/20 1734  LIPASE 23   No results for input(s): AMMONIA in the last 168 hours. Coagulation Profile: No results for input(s): INR, PROTIME in the last 168 hours. Cardiac Enzymes: No results for input(s): CKTOTAL, CKMB, CKMBINDEX, TROPONINI in the last 168 hours. BNP (last 3 results) No results for input(s): PROBNP in the last 8760 hours. HbA1C: No results for input(s): HGBA1C in the last 72 hours. CBG: Recent Labs  Lab 08/04/20 1721 08/04/20 2036 08/05/20 0718 08/05/20 1132 08/05/20 1555  GLUCAP 180* 209* 96 149* 145*   Lipid Profile: No results for input(s): CHOL, HDL, LDLCALC, TRIG, CHOLHDL, LDLDIRECT in the last 72 hours. Thyroid Function Tests: No results for input(s): TSH, T4TOTAL, FREET4, T3FREE, THYROIDAB in the last 72 hours. Anemia Panel: No results for input(s): VITAMINB12, FOLATE, FERRITIN, TIBC, IRON, RETICCTPCT in the last 72 hours. Sepsis Labs: Recent Labs  Lab 08/05/20 0439  PROCALCITON 0.52    Recent Results (from the past 240  hour(s))  Resp Panel by RT-PCR (Flu A&B, Covid) Nasopharyngeal Swab     Status: None   Collection Time: 08/04/20  5:33 PM   Specimen: Nasopharyngeal Swab; Nasopharyngeal(NP) swabs in vial transport medium  Result Value Ref Range Status   SARS Coronavirus 2 by RT PCR NEGATIVE NEGATIVE Final    Comment: (NOTE) SARS-CoV-2 target nucleic acids are NOT DETECTED.  The SARS-CoV-2 RNA is generally detectable in upper respiratory specimens during the acute phase of infection. The lowest concentration of SARS-CoV-2 viral copies this assay can detect is 138 copies/mL. A negative result does not preclude SARS-Cov-2 infection and should not be used as the sole basis for treatment or other  patient management decisions. A negative result may occur with  improper specimen collection/handling, submission of specimen other than nasopharyngeal swab, presence of viral mutation(s) within the areas targeted by this assay, and inadequate number of viral copies(<138 copies/mL). A negative result must be combined with clinical observations, patient history, and epidemiological information. The expected result is Negative.  Fact Sheet for Patients:  EntrepreneurPulse.com.au  Fact Sheet for Healthcare Providers:  IncredibleEmployment.be  This test is no t yet approved or cleared by the [place] FDA and  has been authorized for detection and/or diagnosis of SARS-CoV-2 by FDA under an Emergency Use Authorization (EUA). This EUA will remain  in effect (meaning this test can be used) for the duration of the COVID-19 declaration under Section 564(b)(1) of the Act, 21 U.S.C.section 360bbb-3(b)(1), unless the authorization is terminated  or revoked sooner.       Influenza A by PCR NEGATIVE NEGATIVE Final   Influenza B by PCR NEGATIVE NEGATIVE Final    Comment: (NOTE) The Xpert Xpress SARS-CoV-2/FLU/RSV plus assay is intended as an aid in the diagnosis of influenza from Nasopharyngeal swab specimens and should not be used as a sole basis for treatment. Nasal washings and aspirates are unacceptable for Xpert Xpress SARS-CoV-2/FLU/RSV testing.  Fact Sheet for Patients: EntrepreneurPulse.com.au  Fact Sheet for Healthcare Providers: IncredibleEmployment.be  This test is not yet approved or cleared by the [place] FDA and has been authorized for detection and/or diagnosis of SARS-CoV-2 by FDA under an Emergency Use Authorization (EUA). This EUA will remain in effect (meaning this test can be used) for the duration of the COVID-19 declaration under Section 564(b)(1) of the Act, 21 U.S.C. section  360bbb-3(b)(1), unless the authorization is terminated or revoked.  Performed at [facility], [address] [place],  [address]   Culture, blood (routine x 2) Call MD if unable to obtain prior to antibiotics being given     Status: None (Preliminary result)   Collection Time: 08/04/20  9:21 PM   Specimen: BLOOD  Result Value Ref Range Status   Specimen Description BLOOD  RAC  Final   Special Requests   Final    BOTTLES DRAWN AEROBIC AND ANAEROBIC Blood Culture adequate volume   Culture   Final    NO GROWTH < 12 HOURS Performed at [facility], [address] [place],  [address]    Report Status PENDING  Incomplete  Culture, blood (routine x 2) Call MD if unable to obtain prior to antibiotics being given     Status: None (Preliminary result)   Collection Time: 08/04/20  9:26 PM   Specimen: BLOOD  Result Value Ref Range Status   Specimen Description BLOOD  LEFT ARM  Final   Special Requests   Final    BOTTLES DRAWN AEROBIC AND ANAEROBIC  Blood Culture adequate volume   Culture   Final    NO GROWTH < 12 HOURS Performed at [facility], [address] [place],  [address]    Report Status PENDING  Incomplete         Radiology Studies: NM Pulmonary Perfusion  Result Date: 08/05/2020 CLINICAL DATA:  Positive D-dimer. EXAM: NUCLEAR MEDICINE PERFUSION LUNG SCAN TECHNIQUE: Perfusion images were obtained in multiple projections after intravenous injection of radiopharmaceutical. Ventilation scans intentionally deferred if perfusion scan and chest x-ray adequate for interpretation during COVID 19 epidemic. RADIOPHARMACEUTICALS:  4.3 mCi Tc-40m MAA IV COMPARISON:  Chest x-ray 08/04/2020. FINDINGS: Multiplanar perfusion imaging shows no segmental wedge-shaped defect to suggest the presence of a pulmonary embolus. Posterior left lower lobe does show some photopenia on the LPO view but demonstrates a peripheral stripe sign and appears  normal on RPO imaging suggesting that this may be related to attenuation from the heart. IMPRESSION: No perfusion abnormality to suggest the presence of an acute pulmonary embolus. Electronically Signed   By: Misty Stanley M.D.   On: 08/05/2020 10:59   US Venous Img Lower Bilateral (DVT)  Result Date: 08/05/2020 CLINICAL DATA:  Positive D-dimer EXAM: BILATERAL LOWER EXTREMITY VENOUS DOPPLER ULTRASOUND TECHNIQUE: Gray-scale sonography with compression, as well as color and duplex ultrasound, were performed to evaluate the deep venous system(s) from the level of the common femoral vein through the popliteal and proximal calf veins. COMPARISON:  None. FINDINGS: VENOUS Normal compressibility of the common femoral, superficial femoral, and popliteal veins, as well as the visualized calf veins. Visualized portions of profunda femoral vein and great saphenous vein unremarkable. No filling defects to suggest DVT on grayscale or color Doppler imaging. Doppler waveforms show normal direction of venous flow, normal respiratory plasticity and response to augmentation. OTHER None. Limitations: none IMPRESSION: No lower extremity DVT Electronically Signed   By: Miachel Roux M.D.   On: 08/05/2020 07:23   DG Chest Portable 1 View  Result Date: 08/04/2020 CLINICAL DATA:  Hypoxia. EXAM: PORTABLE CHEST 1 VIEW COMPARISON:  Single view of the chest earlier today and 08/08/2016. FINDINGS: Mild subsegmental atelectasis appears improved. Lungs otherwise clear. Heart size normal. No pneumothorax or pleural fluid. Radiopaque density projecting over the upper right abdomen has been removed. IMPRESSION: Mild bibasilar atelectasis. Electronically Signed   By: Inge Rise M.D.   On: 08/04/2020 15:00   DG Chest Portable 1 View  Result Date: 08/04/2020 CLINICAL DATA:  Hypoxia EXAM: PORTABLE CHEST 1 VIEW COMPARISON:  09/07/2016 FINDINGS: Stable cardiomediastinal contours. Atherosclerotic calcification of the aortic knob. Pulmonary  vascular congestion. Low lung volumes with streaky bibasilar opacities. Small left pleural effusion not excluded. No pneumothorax. Partially visualized bilateral shoulder arthroplasties. 7 cm curvilinear radiopaque density projects over the lower right chest wall, and is likely external to the patient. IMPRESSION: 1. Low lung volumes with streaky bibasilar opacities, which may reflect atelectasis versus pneumonia. 2. 7 cm curvilinear radiopaque density projects over the lower right chest wall, and is likely external to the patient. Electronically Signed   By: Davina Poke D.O.   On: 08/04/2020 14:54        Scheduled Meds: . allopurinol  300 mg Oral QPM  . amLODipine  5 mg Oral Daily   And  . benazepril  20 mg Oral Daily  . aspirin  325 mg Oral q AM  . atorvastatin  10 mg Oral q AM  . bupivacaine liposome  20 mL Infiltration Once  . Chlorhexidine Gluconate Cloth  6 each Topical Once   And  . Chlorhexidine Gluconate Cloth  6 each Topical Once  . finasteride  5 mg Oral q AM  . folic acid  1 mg Oral Daily  . gemfibrozil  600 mg Oral q AM  . insulin aspart  0-5 Units Subcutaneous QHS  . insulin aspart  0-9 Units Subcutaneous TID WC  . LORazepam  0-4 mg Intravenous Q6H   Followed by  . [START ON 08/06/2020] LORazepam  0-4 mg Intravenous Q12H  . metoprolol tartrate  25 mg Oral q AM  . multivitamin with minerals  1 tablet Oral Daily  . pantoprazole  40 mg Oral Daily  . tamsulosin  0.4 mg Oral Daily  . Testosterone  1 application Transdermal q AM  . thiamine  100 mg Oral Daily   Or  . thiamine  100 mg Intravenous Daily   Continuous Infusions: . sodium chloride 10 mL/hr at 08/04/20 1010  . azithromycin Stopped (08/05/20 0028)  . cefTRIAXone (ROCEPHIN)  IV Stopped (08/05/20 0149)     LOS: 0 days    Time spent: 25 minutes    Sidney Ace, MD [facility] Pager [phone]  If 7PM-7AM, please contact night-coverage 08/05/2020, 4:17 PM

## 2020-08-05 NOTE — Progress Notes (Signed)
[place] Hospital Day(s): 0.   Post op day(s): 1 Day Post-Op.   Interval History:  Patient seen and examined No acute events or new complaints overnight.  Patient reports he is overall feeling well Minimal abdominal soreness No fever, chills, nausea, emesis, CP, SOB Continues to have leukocytosis to 18.0K; likely reactive from surgery Renal function remains normal; sCr - 1.20; UO - 750 ccs Currently on 4L [place]; does not use O2 at baseline Tolerating regular diet this monring   Vital signs in last 24 hours: [min-max] current  Temp:  [97.1 F (36.2 C)-99.9 F (37.7 C)] 98.8 F (37.1 C) (06/03 0451) Pulse Rate:  [55-85] 69 (06/03 0451) Resp:  [16-29] 20 (06/03 0451) BP: (124-178)/(58-77) 124/58 (06/03 0451) SpO2:  [79 %-100 %] 93 % (06/03 0451) Weight:  [83.9 kg] 83.9 kg (06/02 0938)     Height: 5\' 9"  (510.2 cm) Weight: 83.9 kg BMI (Calculated): 27.3   Intake/Output last 2 shifts:  06/02 0701 - 06/03 0700 In: 5852 [P.O.:120; I.V.:1200; IV Piggyback:500] Out: 780 [Urine:750; Blood:30]   Physical Exam:  Constitutional: alert, cooperative and no distress  Respiratory: breathing non-labored at rest; on [place] Cardiovascular: regular rate and sinus rhythm  Gastrointestinal: Soft, minimal incisional soreness, and non-distended, no rebound/guarding Integumentary: Laparoscopic incisions are CDI with dermabond, no erythema or drainage   Labs:  CBC Latest Ref Rng & Units 08/05/2020 08/04/2020 01/05/2020  WBC 4.0 - 10.5 K/uL 18.0(H) 18.0(H) 7.5  Hemoglobin 13.0 - 17.0 g/dL 12.6(L) 14.1 15.0  Hematocrit 39.0 - 52.0 % 38.5(L) 43.1 46.1  Platelets 150 - 400 K/uL 181 201 192   CMP Latest Ref Rng & Units 08/05/2020 08/04/2020 07/28/2020  Glucose 70 - 99 mg/dL 114(H) 206(H) -  BUN 8 - 23 mg/dL 23 23 -  Creatinine 0.61 - 1.24 mg/dL 1.20 1.20 -  Sodium 135 - 145 mmol/L 139 139 -  Potassium 3.5 - 5.1 mmol/L 4.2 4.3 -  Chloride 98 - 111 mmol/L 107 106 -   CO2 22 - 32 mmol/L 23 22 -  Calcium 8.9 - 10.3 mg/dL 8.5(L) 8.9 -  Total Protein 6.5 - 8.1 g/dL - 6.9 7.0  Total Bilirubin 0.3 - 1.2 mg/dL - 0.6 0.5  Alkaline Phos 38 - 126 U/L - 62 68  AST 15 - 41 U/L - 43(H) 22  ALT 0 - 44 U/L - 33 25     Imaging studies:   Bilateral Venous US (08/05/2020) personally reviewed without DVT, and radiologist report reviewed below:  IMPRESSION: No lower extremity DVT   Assessment/Plan:  75 y.o. male admitted with post-operative hypoxia 1 Day Post-Op s/p robotic assisted cholecystectomy for choledocholithiasis and cholelithiasis.   - Doing well from surgical perspective; okay to continue regular diet  - Monitor abdominal examination; on-going bowel function  - Pain control prn; antiemetics prn  - Mobilization encouraged  - Would recommend holding ASA an additional day. If need for anticoagulation, please let us know prior to initiating    - Wean to RA as possible   - Further management per primary service    - All issues appear to be pulmonary. Nothing further to add from surgery perspective. We will sign off, please call with questions or concerns.    All of the above findings and recommendations were discussed with the patient, and the medical team, and all of patient's questions were answered to his expressed satisfaction.  -- Edison Simon, PA-C [facility] 08/05/2020, 7:20 AM [phone]  M-F: 7am - 4pm

## 2020-08-05 NOTE — Progress Notes (Addendum)
Mobility Specialist - Progress Note   08/05/20 1200  Mobility  Activity Ambulated in hall  Level of Assistance Independent  Assistive Device None  Distance Ambulated (ft) 180 ft  Mobility Ambulated independently in hallway  Mobility Response Tolerated well  Mobility performed by Mobility specialist  $Mobility charge 1 Mobility    O2 while resting on RA = 86% O2 while AMB on 4L = 88% O2 while AMB on 6L = 93%   Pt lying in bed upon arrival, family at bedside. Pt voiced pain in R shoulder 7/10. Pt utilizing 4L, attempted to wean as able. O2 desat to 86% resting on RA, denied SOB. [place] reapplied on 4L to get sats to 90%. Pt independent in transfers/AMB. Pt desat to 86% a second time while coming into upright position. O2 increased to 6L. Pt ambulated in hallway without AD. No LOB. Sats > 92% using 6L, weaned back to 4L for last 25' and O2 desat to 88%. Pt continuing to deny SOB. No s/s of distress. Lunch tray entered at the end of session and was placed in front of pt. Rn notified of performance.   Kathee Delton Mobility Specialist 08/05/20, 12:10 PM

## 2020-08-06 ENCOUNTER — Observation Stay: Payer: Medicare Other

## 2020-08-06 DIAGNOSIS — Z96611 Presence of right artificial shoulder joint: Secondary | ICD-10-CM | POA: Diagnosis present

## 2020-08-06 DIAGNOSIS — J9601 Acute respiratory failure with hypoxia: Secondary | ICD-10-CM | POA: Diagnosis present

## 2020-08-06 DIAGNOSIS — Z881 Allergy status to other antibiotic agents status: Secondary | ICD-10-CM | POA: Diagnosis not present

## 2020-08-06 DIAGNOSIS — Z8546 Personal history of malignant neoplasm of prostate: Secondary | ICD-10-CM | POA: Diagnosis not present

## 2020-08-06 DIAGNOSIS — I129 Hypertensive chronic kidney disease with stage 1 through stage 4 chronic kidney disease, or unspecified chronic kidney disease: Secondary | ICD-10-CM | POA: Diagnosis present

## 2020-08-06 DIAGNOSIS — K219 Gastro-esophageal reflux disease without esophagitis: Secondary | ICD-10-CM | POA: Diagnosis present

## 2020-08-06 DIAGNOSIS — I2510 Atherosclerotic heart disease of native coronary artery without angina pectoris: Secondary | ICD-10-CM | POA: Diagnosis present

## 2020-08-06 DIAGNOSIS — Z8249 Family history of ischemic heart disease and other diseases of the circulatory system: Secondary | ICD-10-CM | POA: Diagnosis not present

## 2020-08-06 DIAGNOSIS — Z7984 Long term (current) use of oral hypoglycemic drugs: Secondary | ICD-10-CM | POA: Diagnosis not present

## 2020-08-06 DIAGNOSIS — I441 Atrioventricular block, second degree: Secondary | ICD-10-CM | POA: Diagnosis present

## 2020-08-06 DIAGNOSIS — Z955 Presence of coronary angioplasty implant and graft: Secondary | ICD-10-CM | POA: Diagnosis not present

## 2020-08-06 DIAGNOSIS — K8070 Calculus of gallbladder and bile duct without cholecystitis without obstruction: Secondary | ICD-10-CM | POA: Diagnosis present

## 2020-08-06 DIAGNOSIS — Z20822 Contact with and (suspected) exposure to covid-19: Secondary | ICD-10-CM | POA: Diagnosis present

## 2020-08-06 DIAGNOSIS — D72829 Elevated white blood cell count, unspecified: Secondary | ICD-10-CM | POA: Diagnosis not present

## 2020-08-06 DIAGNOSIS — G4733 Obstructive sleep apnea (adult) (pediatric): Secondary | ICD-10-CM | POA: Diagnosis present

## 2020-08-06 DIAGNOSIS — E291 Testicular hypofunction: Secondary | ICD-10-CM | POA: Diagnosis present

## 2020-08-06 DIAGNOSIS — Z833 Family history of diabetes mellitus: Secondary | ICD-10-CM | POA: Diagnosis not present

## 2020-08-06 DIAGNOSIS — E1122 Type 2 diabetes mellitus with diabetic chronic kidney disease: Secondary | ICD-10-CM | POA: Diagnosis present

## 2020-08-06 DIAGNOSIS — R5082 Postprocedural fever: Secondary | ICD-10-CM | POA: Diagnosis not present

## 2020-08-06 DIAGNOSIS — J9811 Atelectasis: Secondary | ICD-10-CM | POA: Diagnosis not present

## 2020-08-06 DIAGNOSIS — E782 Mixed hyperlipidemia: Secondary | ICD-10-CM | POA: Diagnosis present

## 2020-08-06 DIAGNOSIS — M109 Gout, unspecified: Secondary | ICD-10-CM | POA: Diagnosis present

## 2020-08-06 DIAGNOSIS — N1831 Chronic kidney disease, stage 3a: Secondary | ICD-10-CM | POA: Diagnosis present

## 2020-08-06 DIAGNOSIS — K4020 Bilateral inguinal hernia, without obstruction or gangrene, not specified as recurrent: Secondary | ICD-10-CM | POA: Diagnosis present

## 2020-08-06 LAB — GLUCOSE, CAPILLARY
Glucose-Capillary: 121 mg/dL — ABNORMAL HIGH (ref 70–99)
Glucose-Capillary: 161 mg/dL — ABNORMAL HIGH (ref 70–99)
Glucose-Capillary: 135 mg/dL — ABNORMAL HIGH (ref 70–99)
Glucose-Capillary: 159 mg/dL — ABNORMAL HIGH (ref 70–99)

## 2020-08-06 MED ORDER — DICLOFENAC SODIUM 1 % EX GEL
4.0000 g | Freq: Four times a day (QID) | CUTANEOUS | Status: DC
  Administered 2020-08-06 (×2): 4 g via TOPICAL
  Filled 2020-08-06: qty 100

## 2020-08-06 MED ORDER — METHOCARBAMOL 500 MG PO TABS
500.0000 mg | ORAL_TABLET | Freq: Four times a day (QID) | ORAL | Status: DC | PRN
  Administered 2020-08-06 – 2020-08-07 (×3): 500 mg via ORAL
  Filled 2020-08-06 (×3): qty 1

## 2020-08-06 MED ORDER — KETOROLAC TROMETHAMINE 15 MG/ML IJ SOLN
15.0000 mg | Freq: Once | INTRAMUSCULAR | Status: AC
  Administered 2020-08-07: 15 mg via INTRAVENOUS
  Filled 2020-08-06: qty 1

## 2020-08-06 MED ORDER — SODIUM CHLORIDE 0.9 % IV SOLN
3.0000 g | Freq: Four times a day (QID) | INTRAVENOUS | Status: DC
  Administered 2020-08-06 – 2020-08-07 (×6): 3 g via INTRAVENOUS
  Filled 2020-08-06 (×2): qty 8
  Filled 2020-08-06 (×3): qty 3
  Filled 2020-08-06: qty 8
  Filled 2020-08-06: qty 3
  Filled 2020-08-06: qty 8
  Filled 2020-08-06: qty 3

## 2020-08-06 MED ORDER — SODIUM CHLORIDE 0.9 % IV SOLN
INTRAVENOUS | Status: DC | PRN
  Administered 2020-08-06 – 2020-08-07 (×2): 250 mL via INTRAVENOUS

## 2020-08-06 MED ORDER — FLUTICASONE PROPIONATE 50 MCG/ACT NA SUSP
2.0000 | Freq: Every day | NASAL | Status: DC
  Administered 2020-08-06 – 2020-08-07 (×2): 2 via NASAL
  Filled 2020-08-06: qty 16

## 2020-08-06 NOTE — Progress Notes (Signed)
PROGRESS NOTE    Jimmy Mcintyre  OJJ:[id number] DOB: January 25, 1946 DOA: 08/04/2020 PCP: Idelle Crouch, MD   Brief Narrative:  75 y.o. male with medical history significant of hypertension, hyperlipidemia, diabetes mellitus, GERD, gout, tricuspid valve insufficiency, OSA not on CPAP, Mobitz 2 AV block, prostate cancer, BPH, CAD with stent, hypogonadism, CKD stage IIIa, cholelithiasis, who presents with abdominal pain.  Due to right flank pain, patient was seen by urologist and had CT scan on 07/15/2020.  CT scan did not show nephrolithiasis, but did show cholelithiasis with a new finding of choledocholithiasis with common bile duct dilation to 12 mm. Pt was referred to Dr. Hampton Abbot for general surgery. Per Dr. Mont Dutton note, LFT test showed total bilirubin of 0.5, AST 22, ALT 25, alkaline phosphatase 68.   Today pt reports right upper quadrant abdominal pain, which is moderate to severe, sharp, nonradiating.  Denies nausea, vomiting, diarrhea.  No fever or chills.  Patient denies symptoms of UTI, but reports difficulty urinating.  No hematuria. He does have some pain in his back which he attributes to vertebral discs issues.  Pt underwent successful XI ROBOTIC ASSISTED LAPAROSCOPIC CHOLECYSTECTOMY with Cholangiogram by Dr. Hampton Abbot today.    After surgery, patient developed oxygen desaturation to 78% on room air, which improved to 93-100% on 4 L oxygen.    The nature of his hypoxia is unclear.  Continue suspect atelectasis.  VQ scan and lower extremity duplex negative for VTE.  Some mild streakiness on chest x-ray most is consistent with atelectasis however procalcitonin 0.52, intermediate range.  6/4: Fever 100.4 documented overnight.  Patient feels well overall.  Oxygen saturation slowly improving.  Remains on IV antibiotics.  Raises question of aspiration postoperatively.   Assessment & Plan:   Principal Problem:   Cholelithiasis with choledocholithiasis Active Problems:   Benign essential  HTN   Hypogonadism in male   Coronary artery disease   Hyperlipidemia, mixed   OSA (obstructive sleep apnea)   Acute respiratory failure with hypoxia (HCC)   Type II diabetes mellitus with renal manifestations (HCC)   GERD (gastroesophageal reflux disease)   CKD (chronic kidney disease), stage IIIa   Alcohol use   Acute hypoxemic respiratory failure (HCC)  Cholelithiasis with choledocholithiasis Status post lap chole with Dr. Hampton Abbot Postoperative course uncomplicated Continue as needed pain and nausea control No further surgery recommendations  Acute respiratory failure with hypoxia Possible community-acquired pneumonia versus aspiration pneumonia Etiology is not clear VQ scan and bilateral lower extremity duplex negative Chest x-ray most consistent with bibasilar atelectasis Low-grade temperature.  Unable to exclude aspiration versus CAP Procalcitonin 0.52 Cannot completely rule out pneumonia DC Rocephin and azithromycin Start Unasyn Wean oxygen as tolerated Anticipate transition to Augmentin and discharged home 08/08/2018  Benign essential HTN: -IV hydralazine as needed -Continue home Lotrel, metoprolol  Hypogonadism in male -Testosterone gel  Coronary artery disease: S/p of stent placement denies chest pain. -Aspirin, Lipitor, metoprolol  Hyperlipidemia, mixed -Lipitor  OSA (obstructive sleep apnea) -Not using CPAP now  Type II diabetes mellitus with renal manifestations ([place]): Recent A1c 6.9 on 01/05/2020.  Well-controlled.  Patient taking Actos and Amaryl -Sliding scale insulin  GERD (gastroesophageal reflux disease) -Protonix  CKD (chronic kidney disease), stage IIIa: Baseline creatinine 1.1-1.3. -Follow-up with BMP  Alcohol use: -CiWA   DVT prophylaxis: SCD Code Status: Full Family Communication: Wife via phone on 6/4 Disposition Plan:Status is: Inpatient  Remains inpatient appropriate because:Inpatient level of care appropriate due to  severity of illness   Dispo: The patient  is from: Home              Anticipated d/c is to: Home              Patient currently is not medically stable to d/c.   Difficult to place patient No   Postoperative fever and hypoxic respiratory failure.  Unable to exclude infectious process at this time.  Attempting to wean off supplemental oxygen.  Monitor overnight and discharge home on 08/07/2020 if patient remained stable and fever free           Level of care: Med-Surg  Consultants:   None (surgery signed off)  Procedures:   Laparoscopic cholecystectomy  Antimicrobials:    Unasyn   Subjective: Seen and examined.  Endorses some right upper quadrant pain near the surgical site.  No other complaints.  Objective: Vitals:   08/06/20 0700 08/06/20 1000 08/06/20 1200 08/06/20 1300  BP: (!) 156/76 (!) 166/81 123/88 (!) 119/57  Pulse: 79 83 66 62  Resp: 17 16 19 20   Temp: 97.8 F (36.6 C) 97.9 F (36.6 C) 97.6 F (36.4 C)   TempSrc: Oral Oral Oral   SpO2: 91% 97% 94% 95%  Weight:      Height:        Intake/Output Summary (Last 24 hours) at 08/06/2020 1352 Last data filed at 08/06/2020 1108 Gross per 24 hour  Intake 710.18 ml  Output 1845 ml  Net -1134.82 ml   Filed Weights   08/04/20 0938  Weight: 83.9 kg    Examination:  General exam: Appears calm and comfortable  Respiratory system: Mild bibasilar crackles.  Normal work of breathing.  4 L Cardiovascular system: S1 & S2 heard, RRR. No JVD, murmurs, rubs, gallops or clicks. No pedal edema. Gastrointestinal system: Nondistended.  Tender to palpation right upper quadrant.  Normal bowel sounds Central nervous system: Alert and oriented. No focal neurological deficits. Extremities: Symmetric 5 x 5 power. Skin: No rashes, lesions or ulcers Psychiatry: Judgement and insight appear normal. Mood & affect appropriate.     Data Reviewed: I have personally reviewed following labs and imaging studies  CBC: Recent  Labs  Lab 08/04/20 1734 08/05/20 0439  WBC 18.0* 18.0*  HGB 14.1 12.6*  HCT 43.1 38.5*  MCV 88.7 88.7  PLT 201 947   Basic Metabolic Panel: Recent Labs  Lab 08/04/20 1734 08/05/20 0439  NA 139 139  K 4.3 4.2  CL 106 107  CO2 22 23  GLUCOSE 206* 114*  BUN 23 23  CREATININE 1.20 1.20  CALCIUM 8.9 8.5*   GFR: Estimated Creatinine Clearance: 52.4 mL/min (by C-G formula based on SCr of 1.2 mg/dL). Liver Function Tests: Recent Labs  Lab 08/04/20 1734  AST 43*  ALT 33  ALKPHOS 62  BILITOT 0.6  PROT 6.9  ALBUMIN 3.9   Recent Labs  Lab 08/04/20 1734  LIPASE 23   No results for input(s): AMMONIA in the last 168 hours. Coagulation Profile: No results for input(s): INR, PROTIME in the last 168 hours. Cardiac Enzymes: No results for input(s): CKTOTAL, CKMB, CKMBINDEX, TROPONINI in the last 168 hours. BNP (last 3 results) No results for input(s): PROBNP in the last 8760 hours. HbA1C: No results for input(s): HGBA1C in the last 72 hours. CBG: Recent Labs  Lab 08/05/20 1132 08/05/20 1555 08/05/20 2015 08/06/20 0747 08/06/20 1128  GLUCAP 149* 145* 120* 121* 161*   Lipid Profile: No results for input(s): CHOL, HDL, LDLCALC, TRIG, CHOLHDL, LDLDIRECT in the last 72  hours. Thyroid Function Tests: No results for input(s): TSH, T4TOTAL, FREET4, T3FREE, THYROIDAB in the last 72 hours. Anemia Panel: No results for input(s): VITAMINB12, FOLATE, FERRITIN, TIBC, IRON, RETICCTPCT in the last 72 hours. Sepsis Labs: Recent Labs  Lab 08/05/20 0439  PROCALCITON 0.52    Recent Results (from the past 240 hour(s))  Resp Panel by RT-PCR (Flu A&B, Covid) Nasopharyngeal Swab     Status: None   Collection Time: 08/04/20  5:33 PM   Specimen: Nasopharyngeal Swab; Nasopharyngeal(NP) swabs in vial transport medium  Result Value Ref Range Status   SARS Coronavirus 2 by RT PCR NEGATIVE NEGATIVE Final    Comment: (NOTE) SARS-CoV-2 target nucleic acids are NOT DETECTED.  The  SARS-CoV-2 RNA is generally detectable in upper respiratory specimens during the acute phase of infection. The lowest concentration of SARS-CoV-2 viral copies this assay can detect is 138 copies/mL. A negative result does not preclude SARS-Cov-2 infection and should not be used as the sole basis for treatment or other patient management decisions. A negative result may occur with  improper specimen collection/handling, submission of specimen other than nasopharyngeal swab, presence of viral mutation(s) within the areas targeted by this assay, and inadequate number of viral copies(<138 copies/mL). A negative result must be combined with clinical observations, patient history, and epidemiological information. The expected result is Negative.  Fact Sheet for Patients:  EntrepreneurPulse.com.au  Fact Sheet for Healthcare Providers:  IncredibleEmployment.be  This test is no t yet approved or cleared by the [place] FDA and  has been authorized for detection and/or diagnosis of SARS-CoV-2 by FDA under an Emergency Use Authorization (EUA). This EUA will remain  in effect (meaning this test can be used) for the duration of the COVID-19 declaration under Section 564(b)(1) of the Act, 21 U.S.C.section 360bbb-3(b)(1), unless the authorization is terminated  or revoked sooner.       Influenza A by PCR NEGATIVE NEGATIVE Final   Influenza B by PCR NEGATIVE NEGATIVE Final    Comment: (NOTE) The Xpert Xpress SARS-CoV-2/FLU/RSV plus assay is intended as an aid in the diagnosis of influenza from Nasopharyngeal swab specimens and should not be used as a sole basis for treatment. Nasal washings and aspirates are unacceptable for Xpert Xpress SARS-CoV-2/FLU/RSV testing.  Fact Sheet for Patients: EntrepreneurPulse.com.au  Fact Sheet for Healthcare Providers: IncredibleEmployment.be  This test is not yet approved or  cleared by the [place] FDA and has been authorized for detection and/or diagnosis of SARS-CoV-2 by FDA under an Emergency Use Authorization (EUA). This EUA will remain in effect (meaning this test can be used) for the duration of the COVID-19 declaration under Section 564(b)(1) of the Act, 21 U.S.C. section 360bbb-3(b)(1), unless the authorization is terminated or revoked.  Performed at [facility], [address] [place], [place] [address]   Culture, blood (routine x 2) Call MD if unable to obtain prior to antibiotics being given     Status: None (Preliminary result)   Collection Time: 08/04/20  9:21 PM   Specimen: BLOOD  Result Value Ref Range Status   Specimen Description BLOOD  RAC  Final   Special Requests   Final    BOTTLES DRAWN AEROBIC AND ANAEROBIC Blood Culture adequate volume   Culture   Final    NO GROWTH 2 DAYS Performed at [facility], [address] [place], [place] [address]    Report Status PENDING  Incomplete  Culture, blood (routine x 2) Call MD if unable to obtain prior to  antibiotics being given     Status: None (Preliminary result)   Collection Time: 08/04/20  9:26 PM   Specimen: BLOOD  Result Value Ref Range Status   Specimen Description BLOOD  LEFT ARM  Final   Special Requests   Final    BOTTLES DRAWN AEROBIC AND ANAEROBIC Blood Culture adequate volume   Culture   Final    NO GROWTH 2 DAYS Performed at [facility], [address] [place], [place] [address]    Report Status PENDING  Incomplete         Radiology Studies: NM Pulmonary Perfusion  Result Date: 08/05/2020 CLINICAL DATA:  Positive D-dimer. EXAM: NUCLEAR MEDICINE PERFUSION LUNG SCAN TECHNIQUE: Perfusion images were obtained in multiple projections after intravenous injection of radiopharmaceutical. Ventilation scans intentionally deferred if perfusion scan and chest x-ray adequate for interpretation during COVID 19 epidemic. RADIOPHARMACEUTICALS:   4.3 mCi Tc-4m MAA IV COMPARISON:  Chest x-ray 08/04/2020. FINDINGS: Multiplanar perfusion imaging shows no segmental wedge-shaped defect to suggest the presence of a pulmonary embolus. Posterior left lower lobe does show some photopenia on the LPO view but demonstrates a peripheral stripe sign and appears normal on RPO imaging suggesting that this may be related to attenuation from the heart. IMPRESSION: No perfusion abnormality to suggest the presence of an acute pulmonary embolus. Electronically Signed   By: Misty Stanley M.D.   On: 08/05/2020 10:59   US Venous Img Lower Bilateral (DVT)  Result Date: 08/05/2020 CLINICAL DATA:  Positive D-dimer EXAM: BILATERAL LOWER EXTREMITY VENOUS DOPPLER ULTRASOUND TECHNIQUE: Gray-scale sonography with compression, as well as color and duplex ultrasound, were performed to evaluate the deep venous system(s) from the level of the common femoral vein through the popliteal and proximal calf veins. COMPARISON:  None. FINDINGS: VENOUS Normal compressibility of the common femoral, superficial femoral, and popliteal veins, as well as the visualized calf veins. Visualized portions of profunda femoral vein and great saphenous vein unremarkable. No filling defects to suggest DVT on grayscale or color Doppler imaging. Doppler waveforms show normal direction of venous flow, normal respiratory plasticity and response to augmentation. OTHER None. Limitations: none IMPRESSION: No lower extremity DVT Electronically Signed   By: Miachel Roux M.D.   On: 08/05/2020 07:23   DG Chest Port 1 View  Result Date: 08/06/2020 CLINICAL DATA:  Abdominal pain. Medical history significant of hypertension, hyperlipidemia, diabetes mellitus, GERD, gout, tricuspid valve insufficiency, OSA not on CPAP, Mobitz 2 AV block, prostate cancer, BPH, CAD with stent, hypogonadism, CKD stage IIIa, cholelithiasis EXAM: PORTABLE CHEST 1 VIEW COMPARISON:  08/04/2020 FINDINGS: Low lung volumes are present, causing  crowding of the pulmonary vasculature. Indistinct airspace opacities at both lung bases potentially from atelectasis or pneumonia. Atherosclerotic calcification of the aortic arch. Bilateral shoulder arthroplasties. Mild dextroconvex thoracic scoliosis. IMPRESSION: 1. Bibasilar airspace opacities favor atelectasis although pneumonia is not excluded. 2. Low lung volumes. 3. Dextroconvex thoracic scoliosis. 4.  Aortic Atherosclerosis (ICD10-I70.0). Electronically Signed   By: Van Clines M.D.   On: 08/06/2020 08:49   DG Chest Portable 1 View  Result Date: 08/04/2020 CLINICAL DATA:  Hypoxia. EXAM: PORTABLE CHEST 1 VIEW COMPARISON:  Single view of the chest earlier today and 08/08/2016. FINDINGS: Mild subsegmental atelectasis appears improved. Lungs otherwise clear. Heart size normal. No pneumothorax or pleural fluid. Radiopaque density projecting over the upper right abdomen has been removed. IMPRESSION: Mild bibasilar atelectasis. Electronically Signed   By: Inge Rise M.D.   On: 08/04/2020 15:00   DG Chest Portable 1  View  Result Date: 08/04/2020 CLINICAL DATA:  Hypoxia EXAM: PORTABLE CHEST 1 VIEW COMPARISON:  09/07/2016 FINDINGS: Stable cardiomediastinal contours. Atherosclerotic calcification of the aortic knob. Pulmonary vascular congestion. Low lung volumes with streaky bibasilar opacities. Small left pleural effusion not excluded. No pneumothorax. Partially visualized bilateral shoulder arthroplasties. 7 cm curvilinear radiopaque density projects over the lower right chest wall, and is likely external to the patient. IMPRESSION: 1. Low lung volumes with streaky bibasilar opacities, which may reflect atelectasis versus pneumonia. 2. 7 cm curvilinear radiopaque density projects over the lower right chest wall, and is likely external to the patient. Electronically Signed   By: Davina Poke D.O.   On: 08/04/2020 14:54        Scheduled Meds: . allopurinol  300 mg Oral QPM  . amLODipine   5 mg Oral Daily   And  . benazepril  20 mg Oral Daily  . aspirin  325 mg Oral q AM  . atorvastatin  10 mg Oral q AM  . bupivacaine liposome  20 mL Infiltration Once  . Chlorhexidine Gluconate Cloth  6 each Topical Once   And  . Chlorhexidine Gluconate Cloth  6 each Topical Once  . finasteride  5 mg Oral q AM  . fluticasone  2 spray Each Nare Daily  . folic acid  1 mg Oral Daily  . gemfibrozil  600 mg Oral q AM  . insulin aspart  0-5 Units Subcutaneous QHS  . insulin aspart  0-9 Units Subcutaneous TID WC  . LORazepam  0-4 mg Intravenous Q6H   Followed by  . LORazepam  0-4 mg Intravenous Q12H  . metoprolol tartrate  25 mg Oral q AM  . multivitamin with minerals  1 tablet Oral Daily  . pantoprazole  40 mg Oral Daily  . tamsulosin  0.4 mg Oral Daily  . Testosterone  1 application Transdermal q AM  . thiamine  100 mg Oral Daily   Or  . thiamine  100 mg Intravenous Daily   Continuous Infusions: . sodium chloride 10 mL/hr at 08/06/20 1106  . ampicillin-sulbactam (UNASYN) IV 3 g (08/06/20 1108)     LOS: 0 days    Time spent: 25 minutes    Sidney Ace, MD [facility] Pager [phone]  If 7PM-7AM, please contact night-coverage 08/06/2020, 1:52 PM

## 2020-08-06 NOTE — Consult Note (Signed)
Pharmacy Antibiotic Note  Jimmy Mcintyre is a 75 y.o. male admitted on 08/04/2020 with aspiration pneumonia . Pt presented with abdominal pain d/t cholelithiasis. Pt underwent cholecystectomy on 6/2 and ERCP for CBD stone on 5/31. Pt also with acute respiratory failure with CXR concerning for atelectasis though PNA cannot be ruled out. PMH includes HTN, HLD, diabetes, GERD, gout, OSA, BPH, CKD, and CAD. Pharmacy has been consulted for Unasyn dosing.  Tmax 100.4 6/23 @ 2300, PCT 0.52, WBC 18, Scr 1.2  Plan: Start Unasyn 3 g q6h  Monitor renal function and cxs  Height: 5\' 9"  (175.3 cm) Weight: 83.9 kg (184 lb 15.5 oz) IBW/kg (Calculated) : 70.7  Temp (24hrs), Avg:99.1 F (37.3 C), Min:97.8 F (36.6 C), Max:100.4 F (38 C)  Recent Labs  Lab 08/04/20 1734 08/05/20 0439  WBC 18.0* 18.0*  CREATININE 1.20 1.20    Estimated Creatinine Clearance: 52.4 mL/min (by C-G formula based on SCr of 1.2 mg/dL).    Allergies  Allergen Reactions  . Bactrim [Sulfamethoxazole-Trimethoprim] Rash  . Levofloxacin Rash    Antimicrobials this admission: 6/4 Unasyn >>  6/2 azithromycin >> 6/3 6/2 cefazolin x 1 dose 6/2 ceftriaxone >> 6/3  Microbiology results: 6/2 BCx: NGTD 6/2 Sputum: pending  Thank you for allowing pharmacy to be a part of this patient's care.  Benn Moulder, PharmD Pharmacy Resident  08/06/2020 9:46 AM

## 2020-08-06 NOTE — Progress Notes (Signed)
I have personally assessed this patient and agree with assessment documentation completed by Vilma Prader, SN

## 2020-08-06 NOTE — Progress Notes (Signed)
Mobility Specialist - Progress Note   08/06/20 1257  Mobility  Activity Ambulated in hall  Level of Assistance Independent  Assistive Device None  Distance Ambulated (ft) 480 ft  Mobility Ambulated independently in hallway  Mobility Response Tolerated well  Mobility performed by Mobility specialist  $Mobility charge 1 Mobility    O2 while AMB on RA = 91%   Pt agreeable to wean O2 as able. Stu-RN in with pt at time of arrival to assist with session. Voiced pain in abdomen 5/10, meds given prior to session. O2 88% resting on 1L but does improve to 97% on 2L prior to OOB. Pt began ambulation on 2L and was weaned to RA during activity. O2 maintained > 90% during ambulation. Denied SOB throughout session. Upon return to room, pt's O2 desat to 87%, still on RA, placed back on 2L prior to exit with sats at 92%. RN notified of performance.    Kathee Delton Mobility Specialist 08/06/20, 1:16 PM

## 2020-08-07 LAB — CBC WITH DIFFERENTIAL/PLATELET
Abs Immature Granulocytes: 0.06 10*3/uL (ref 0.00–0.07)
Basophils Absolute: 0 10*3/uL (ref 0.0–0.1)
Basophils Relative: 0 %
Eosinophils Absolute: 0.1 10*3/uL (ref 0.0–0.5)
Eosinophils Relative: 1 %
HCT: 39.9 % (ref 39.0–52.0)
Hemoglobin: 13.4 g/dL (ref 13.0–17.0)
Immature Granulocytes: 0 %
Lymphocytes Relative: 10 %
Lymphs Abs: 1.4 10*3/uL (ref 0.7–4.0)
MCH: 29.3 pg (ref 26.0–34.0)
MCHC: 33.6 g/dL (ref 30.0–36.0)
MCV: 87.3 fL (ref 80.0–100.0)
Monocytes Absolute: 1.8 10*3/uL — ABNORMAL HIGH (ref 0.1–1.0)
Monocytes Relative: 13 %
Neutro Abs: 10.3 10*3/uL — ABNORMAL HIGH (ref 1.7–7.7)
Neutrophils Relative %: 76 %
Platelets: 191 10*3/uL (ref 150–400)
RBC: 4.57 MIL/uL (ref 4.22–5.81)
RDW: 14.5 % (ref 11.5–15.5)
WBC: 13.8 10*3/uL — ABNORMAL HIGH (ref 4.0–10.5)
nRBC: 0 % (ref 0.0–0.2)

## 2020-08-07 LAB — CREATININE, SERUM
Creatinine, Ser: 1.06 mg/dL (ref 0.61–1.24)
GFR, Estimated: >60 mL/min (ref >60)

## 2020-08-07 LAB — LEGIONELLA PNEUMOPHILA SEROGP 1 UR AG: L. pneumophila Serogp 1 Ur Ag: NEGATIVE

## 2020-08-07 LAB — GLUCOSE, CAPILLARY
Glucose-Capillary: 96 mg/dL (ref 70–99)
Glucose-Capillary: 139 mg/dL — ABNORMAL HIGH (ref 70–99)

## 2020-08-07 MED ORDER — LIDOCAINE 5 % EX PTCH
1.0000 | MEDICATED_PATCH | CUTANEOUS | Status: DC
  Administered 2020-08-07: 1 via TRANSDERMAL
  Filled 2020-08-07 (×2): qty 1

## 2020-08-07 MED ORDER — AMOXICILLIN-POT CLAVULANATE 875-125 MG PO TABS: 1.0000 | ORAL_TABLET | Freq: Two times a day (BID) | ORAL | 0 refills | Status: AC

## 2020-08-07 MED ORDER — LIDOCAINE 5 % EX PTCH: 1.0000 | MEDICATED_PATCH | CUTANEOUS | 0 refills | Status: DC

## 2020-08-07 NOTE — Progress Notes (Signed)
Per MD patient weened to room air throughout day. Began day at 2L, remained above 90% at 1L, then room air.  Ambulated on room air 240 feet, SPO2 remained >90%.  MD notified.

## 2020-08-07 NOTE — Progress Notes (Signed)
Patient continues to complain about neck pain 6-9 out of 10. Was given Robaxin twice during shift with little relief. Voltaren did not help. Oxycodone 5 mg given at 2229 did not help, notified Dr. Kennon Holter via secure chat and ordered one time dose of Toradol 15 mg with some relief.   Patient has testosterone Gel application scheduled at 7 am but it has not been giving being that pharmacy does not have it available.

## 2020-08-07 NOTE — Discharge Summary (Signed)
Physician Discharge Summary  Jimmy Mcintyre HGD:[id number] DOB: [birth year] DOA: 08/04/2020  PCP: Idelle Crouch, MD  Admit date: 08/04/2020 Discharge date: 08/07/2020  Admitted From: Home Disposition: Home  Recommendations for Outpatient Follow-up:  1. Follow up with PCP in 1-2 weeks 2. Please obtain BMP/CBC in one week 3. Please follow up on the following pending results:  Home Health: No Equipment/Devices: None Discharge Condition: Stable CODE STATUS: Full Diet recommendation: Bland Brief/Interim Summary: 75 y.o.malewith medical history significant ofhypertension, hyperlipidemia, diabetes mellitus, GERD, gout, tricuspid valve insufficiency, OSAnot onCPAP, Mobitz 2 AV block, prostate cancer, BPH, CADwith stent, hypogonadism, CKD stage IIIa, cholelithiasis, who presents with abdominal pain.  Due to right flank pain,patient was seen byurologist and had CT scan on 07/15/2020. CT scandid not show nephrolithiasis,but did show cholelithiasis with a new finding of choledocholithiasis with common bile duct dilation to 12 mm. Pt wasreferred toDr. Hampton Abbot for general surgery.Per Dr.Piscoya's note,LFTtest showedtotal bilirubin of 0.5, AST 22, ALT 25, alkaline phosphatase 68.  Today ptreportsright upper quadrant abdominal pain, which is moderate to severe, sharp, nonradiating. Denies nausea, vomiting, diarrhea. No fever or chills. Patient denies symptoms of UTI, but reports difficulty urinating. No hematuria. He does have some pain in his backwhichhe attributes tovertebral discs issues.Pt underwentsuccessfulXI ROBOTIC ASSISTED LAPAROSCOPIC CHOLECYSTECTOMY with Cholangiogramby Dr.Piscoya today.   After surgery, patient developed oxygen desaturation to 78% on room air, which improved to 93-100% on 4 L oxygen.    The nature of his hypoxia is unclear.  Continue suspect atelectasis.  VQ scan and lower extremity duplex negative for VTE.  Some mild streakiness on chest x-ray  most is consistent with atelectasis however procalcitonin 0.52, intermediate range.  6/4: Fever 100.4 documented overnight.  Patient feels well overall.  Oxygen saturation slowly improving.  Remains on IV antibiotics.  Raises question of aspiration postoperatively.  6/5: Patient was weaned from supplemental oxygen.  He remained seizure-free for 24 hours prior to discharge.  He feels well at time of discharge.  As we cannot entirely exclude a pneumonia as a cause for his postoperative hypoxia will recommend completing a short course of antibiotics.  Patient received 2 doses of IV antibiotic in house.  Will recommend 3 days of Augmentin 875 mg twice daily at time of discharge.  Patient stable for discharge at this time.  Outpatient follow-up with PCP and general surgery.  Discharge Diagnoses:  Principal Problem:   Cholelithiasis with choledocholithiasis Active Problems:   Benign essential HTN   Hypogonadism in male   Coronary artery disease   Hyperlipidemia, mixed   OSA (obstructive sleep apnea)   Acute respiratory failure with hypoxia (HCC)   Type II diabetes mellitus with renal manifestations (HCC)   GERD (gastroesophageal reflux disease)   CKD (chronic kidney disease), stage IIIa   Alcohol use   Acute hypoxemic respiratory failure (HCC)  Cholelithiasis with choledocholithiasis Status post lap chole with Dr. Hampton Abbot Postoperative course uncomplicated Continue as needed pain and nausea control No further surgery recommendations  Acute respiratory failure with hypoxia Possible community-acquired pneumonia versus aspiration pneumonia Etiology is not clear VQ scan and bilateral lower extremity duplex negative Chest x-ray most consistent with bibasilar atelectasis Low-grade temperature.  Unable to exclude aspiration versus CAP Procalcitonin 0.52 Cannot completely rule out pneumonia Given postoperative state will cover aspiration Patient received 2 days of Unasyn in house Wean off  supplemental oxygen on day of discharge At this time transition to p.o. Augmentin to complete total 5-day course Discharged with outpatient follow-up with PCP and general surgery  Discharge Instructions  Discharge Instructions    Call MD for:  difficulty breathing, headache or visual disturbances   Complete by: As directed    Call MD for:  persistant nausea and vomiting   Complete by: As directed    Call MD for:  redness, tenderness, or signs of infection (pain, swelling, redness, odor or green/yellow discharge around incision site)   Complete by: As directed    Call MD for:  severe uncontrolled pain   Complete by: As directed    Call MD for:  temperature >100.4   Complete by: As directed    Diet - low sodium heart healthy   Complete by: As directed    Discharge instructions   Complete by: As directed    1.  Patient may shower, but do not scrub wounds heavily and dab dry only. 2.  Do not submerge wounds in pool/tub for 1 week. 3.  Do not apply ointments or hydrogen peroxide to the wounds. 4.  May apply ice packs to the wounds for comfort. 5.  May resume Aspirin on 08/06/20.   Driving Restrictions   Complete by: As directed    Do not drive while taking narcotics for pain control.   Increase activity slowly   Complete by: As directed    Increase activity slowly   Complete by: As directed    Lifting restrictions   Complete by: As directed    No heavy lifting or pushing of more than 10-15 lbs for 4 weeks.   No dressing needed   Complete by: As directed    No wound care   Complete by: As directed      Allergies as of 08/07/2020      Reactions   Bactrim [sulfamethoxazole-trimethoprim] Rash   Levofloxacin Rash      Medication List    TAKE these medications   acetaminophen 500 MG tablet Commonly known as: TYLENOL Take 1,000 mg by mouth every 6 (six) hours as needed for moderate pain. What changed: Another medication with the same name was added. Make sure you understand how  and when to take each.   acetaminophen 500 MG tablet Commonly known as: TYLENOL Take 2 tablets (1,000 mg total) by mouth every 6 (six) hours as needed for mild pain. What changed: You were already taking a medication with the same name, and this prescription was added. Make sure you understand how and when to take each.   allopurinol 300 MG tablet Commonly known as: ZYLOPRIM Take 300 mg by mouth every evening.   amLODipine-benazepril 5-20 MG capsule Commonly known as: LOTREL Take 1 capsule by mouth in the morning.   amoxicillin-clavulanate 875-125 MG tablet Commonly known as: Augmentin Take 1 tablet by mouth 2 (two) times daily for 3 days. Start taking on: August 08, 2020   aspirin 325 MG tablet Take 325 mg by mouth in the morning.   atorvastatin 10 MG tablet Commonly known as: LIPITOR Take 10 mg by mouth in the morning.   cyanocobalamin 1000 MCG/ML injection Commonly known as: (VITAMIN B-12) Inject 1,000 mcg into the muscle every 30 (thirty) days.   finasteride 5 MG tablet Commonly known as: PROSCAR Take 5 mg by mouth in the morning.   gemfibrozil 600 MG tablet Commonly known as: LOPID Take 600 mg by mouth in the morning.   glimepiride 4 MG tablet Commonly known as: AMARYL Take 4 mg by mouth daily with breakfast.   meloxicam 15 MG tablet Commonly known as: MOBIC Take 15 mg  by mouth in the morning.   metoprolol tartrate 25 MG tablet Commonly known as: LOPRESSOR Take 25 mg by mouth in the morning.   omeprazole 20 MG capsule Commonly known as: PRILOSEC Take 20 mg by mouth in the morning.   oxyCODONE 5 MG immediate release tablet Commonly known as: Oxy IR/ROXICODONE Take 1 tablet (5 mg total) by mouth every 4 (four) hours as needed for severe pain.   pioglitazone 30 MG tablet Commonly known as: ACTOS Take 30 mg by mouth in the morning.   SYRINGE/NEEDLE (DISP) 1 ML 25G X 5/8" 1 ML Misc Use 1 Syringe monthly.   tamsulosin 0.4 MG Caps capsule Commonly known  as: FLOMAX Take 1 capsule (0.4 mg total) by mouth daily.   Testosterone 20.25 MG/1.25GM (1.62%) Gel Place 1 application onto the skin in the morning. Shoulders   zolpidem 12.5 MG CR tablet Commonly known as: AMBIEN CR Take 12.5 mg by mouth at bedtime.            Discharge Care Instructions  (From admission, onward)         Start     Ordered   08/04/20 0000  No dressing needed        08/04/20 1417          Follow-up Information    Tylene Fantasia, PA-C Follow up on 08/18/2020.   Specialty: Physician Assistant Why: Post op appointment is at 10:00AM Contact information: [address] [place] [address] [phone]              Allergies  Allergen Reactions  . Bactrim [Sulfamethoxazole-Trimethoprim] Rash  . Levofloxacin Rash    Consultations:  General surgery   Procedures/Studies: NM Pulmonary Perfusion  Result Date: 08/05/2020 CLINICAL DATA:  Positive D-dimer. EXAM: NUCLEAR MEDICINE PERFUSION LUNG SCAN TECHNIQUE: Perfusion images were obtained in multiple projections after intravenous injection of radiopharmaceutical. Ventilation scans intentionally deferred if perfusion scan and chest x-ray adequate for interpretation during COVID 19 epidemic. RADIOPHARMACEUTICALS:  4.3 mCi Tc-77m MAA IV COMPARISON:  Chest x-ray 08/04/2020. FINDINGS: Multiplanar perfusion imaging shows no segmental wedge-shaped defect to suggest the presence of a pulmonary embolus. Posterior left lower lobe does show some photopenia on the LPO view but demonstrates a peripheral stripe sign and appears normal on RPO imaging suggesting that this may be related to attenuation from the heart. IMPRESSION: No perfusion abnormality to suggest the presence of an acute pulmonary embolus. Electronically Signed   By: Misty Stanley M.D.   On: 08/05/2020 10:59   US Venous Img Lower Bilateral (DVT)  Result Date: 08/05/2020 CLINICAL DATA:  Positive D-dimer EXAM: BILATERAL LOWER EXTREMITY  VENOUS DOPPLER ULTRASOUND TECHNIQUE: Gray-scale sonography with compression, as well as color and duplex ultrasound, were performed to evaluate the deep venous system(s) from the level of the common femoral vein through the popliteal and proximal calf veins. COMPARISON:  None. FINDINGS: VENOUS Normal compressibility of the common femoral, superficial femoral, and popliteal veins, as well as the visualized calf veins. Visualized portions of profunda femoral vein and great saphenous vein unremarkable. No filling defects to suggest DVT on grayscale or color Doppler imaging. Doppler waveforms show normal direction of venous flow, normal respiratory plasticity and response to augmentation. OTHER None. Limitations: none IMPRESSION: No lower extremity DVT Electronically Signed   By: Miachel Roux M.D.   On: 08/05/2020 07:23   DG Chest Port 1 View  Result Date: 08/06/2020 CLINICAL DATA:  Abdominal pain. Medical history significant of hypertension, hyperlipidemia, diabetes mellitus, GERD,  gout, tricuspid valve insufficiency, OSA not on CPAP, Mobitz 2 AV block, prostate cancer, BPH, CAD with stent, hypogonadism, CKD stage IIIa, cholelithiasis EXAM: PORTABLE CHEST 1 VIEW COMPARISON:  08/04/2020 FINDINGS: Low lung volumes are present, causing crowding of the pulmonary vasculature. Indistinct airspace opacities at both lung bases potentially from atelectasis or pneumonia. Atherosclerotic calcification of the aortic arch. Bilateral shoulder arthroplasties. Mild dextroconvex thoracic scoliosis. IMPRESSION: 1. Bibasilar airspace opacities favor atelectasis although pneumonia is not excluded. 2. Low lung volumes. 3. Dextroconvex thoracic scoliosis. 4.  Aortic Atherosclerosis (ICD10-I70.0). Electronically Signed   By: Van Clines M.D.   On: 08/06/2020 08:49   DG Chest Portable 1 View  Result Date: 08/04/2020 CLINICAL DATA:  Hypoxia. EXAM: PORTABLE CHEST 1 VIEW COMPARISON:  Single view of the chest earlier today and  08/08/2016. FINDINGS: Mild subsegmental atelectasis appears improved. Lungs otherwise clear. Heart size normal. No pneumothorax or pleural fluid. Radiopaque density projecting over the upper right abdomen has been removed. IMPRESSION: Mild bibasilar atelectasis. Electronically Signed   By: Inge Rise M.D.   On: 08/04/2020 15:00   DG Chest Portable 1 View  Result Date: 08/04/2020 CLINICAL DATA:  Hypoxia EXAM: PORTABLE CHEST 1 VIEW COMPARISON:  09/07/2016 FINDINGS: Stable cardiomediastinal contours. Atherosclerotic calcification of the aortic knob. Pulmonary vascular congestion. Low lung volumes with streaky bibasilar opacities. Small left pleural effusion not excluded. No pneumothorax. Partially visualized bilateral shoulder arthroplasties. 7 cm curvilinear radiopaque density projects over the lower right chest wall, and is likely external to the patient. IMPRESSION: 1. Low lung volumes with streaky bibasilar opacities, which may reflect atelectasis versus pneumonia. 2. 7 cm curvilinear radiopaque density projects over the lower right chest wall, and is likely external to the patient. Electronically Signed   By: Davina Poke D.O.   On: 08/04/2020 14:54   DG C-Arm 1-60 Min-No Report  Result Date: 08/02/2020 Fluoroscopy was utilized by the requesting physician.  No radiographic interpretation.   CT RENAL STONE STUDY  Result Date: 07/17/2020 CLINICAL DATA:  75 year old male with flank pain. Concern for kidney stone. EXAM: CT ABDOMEN AND PELVIS WITHOUT CONTRAST TECHNIQUE: Multidetector CT imaging of the abdomen and pelvis was performed following the standard protocol without IV contrast. COMPARISON:  CT abdomen pelvis dated 04/04/2017. FINDINGS: Evaluation of this exam is limited in the absence of intravenous contrast. Lower chest: Bibasilar linear atelectasis/scarring. The visualized lung bases are otherwise clear. Partially visualized small pericardial effusion measuring approximately 5 mm in  thickness. No intra-abdominal free air or free fluid. Hepatobiliary: The liver is unremarkable. No intrahepatic biliary ductal dilatation. Several gallstones. No pericholecystic fluid or evidence of acute cholecystitis. There are multiple stones in the central CBD. The common bile duct is dilated measuring up to 12 mm. MRCP may provide better evaluation of the gallbladder and bile duct. Pancreas: Unremarkable. No pancreatic ductal dilatation or surrounding inflammatory changes. Spleen: Normal in size without focal abnormality. Adrenals/Urinary Tract: The adrenal glands unremarkable. There is no hydronephrosis or nephrolithiasis on either side. There is a 4 cm right renal posterior interpolar cyst. The visualized ureters appear unremarkable. The urinary bladder is decompressed around a Foley catheter. Stomach/Bowel: There is sigmoid diverticulosis without active inflammatory changes. There is herniation of a short segment of sigmoid colon into the left inguinal canal, new since the prior CT. No evidence of obstruction or inflammation. There is no bowel obstruction. Appendectomy. Vascular/Lymphatic: Mild aortoiliac atherosclerotic disease. The IVC is unremarkable. No portal venous gas. There is no adenopathy. Reproductive: The prostate and seminal vesicles are  grossly unremarkable. No pelvic mass. Penile implant with reservoir in the pelvis. Other: Midline vertical anterior pelvic wall incisional scar. There is a fat containing right inguinal hernia as seen on the prior CT. Musculoskeletal: Degenerative changes of the spine. No acute osseous pathology. IMPRESSION: 1. No acute intra-abdominal or pelvic pathology. No hydronephrosis or nephrolithiasis. 2. Cholelithiasis and choledocholithiasis with mild dilatation of the CBD. MRCP may provide better evaluation of the gallbladder and bile duct. 3. Sigmoid diverticulosis. No bowel obstruction or inflammation. 4. Left inguinal hernia containing a short segment of sigmoid  colon, new since the prior CT. 5. Aortic Atherosclerosis (ICD10-I70.0). Electronically Signed   By: Anner Crete M.D.   On: 07/17/2020 13:15    (Echo, Carotid, EGD, Colonoscopy, ERCP)    Subjective: Seen and examined the day of discharge.  Stable, no distress.  Feels well.  Stable for discharge home.  Discharge Exam: Vitals:   08/07/20 0811 08/07/20 1143  BP: (!) 155/71 134/73  Pulse: 78 67  Resp: 18 18  Temp: 99 F (37.2 C) 98.4 F (36.9 C)  SpO2: 91% 92%   Vitals:   08/07/20 0018 08/07/20 0338 08/07/20 0811 08/07/20 1143  BP: (!) 161/76 (!) 146/77 (!) 155/71 134/73  Pulse: 88 80 78 67  Resp: 18 16 18 18   Temp: 99.4 F (37.4 C) 98.2 F (36.8 C) 99 F (37.2 C) 98.4 F (36.9 C)  TempSrc: Oral Oral Oral Oral  SpO2:  95% 91% 92%  Weight:      Height:        General: Pt is alert, awake, not in acute distress Cardiovascular: RRR, S1/S2 +, no rubs, no gallops Respiratory: CTA bilaterally, no wheezing, no rhonchi Abdominal: Soft, NT, ND, bowel sounds + Extremities: no edema, no cyanosis    The results of significant diagnostics from this hospitalization (including imaging, microbiology, ancillary and laboratory) are listed below for reference.     Microbiology: Recent Results (from the past 240 hour(s))  Resp Panel by RT-PCR (Flu A&B, Covid) Nasopharyngeal Swab     Status: None   Collection Time: 08/04/20  5:33 PM   Specimen: Nasopharyngeal Swab; Nasopharyngeal(NP) swabs in vial transport medium  Result Value Ref Range Status   SARS Coronavirus 2 by RT PCR NEGATIVE NEGATIVE Final    Comment: (NOTE) SARS-CoV-2 target nucleic acids are NOT DETECTED.  The SARS-CoV-2 RNA is generally detectable in upper respiratory specimens during the acute phase of infection. The lowest concentration of SARS-CoV-2 viral copies this assay can detect is 138 copies/mL. A negative result does not preclude SARS-Cov-2 infection and should not be used as the sole basis for treatment  or other patient management decisions. A negative result may occur with  improper specimen collection/handling, submission of specimen other than nasopharyngeal swab, presence of viral mutation(s) within the areas targeted by this assay, and inadequate number of viral copies(<138 copies/mL). A negative result must be combined with clinical observations, patient history, and epidemiological information. The expected result is Negative.  Fact Sheet for Patients:  EntrepreneurPulse.com.au  Fact Sheet for Healthcare Providers:  IncredibleEmployment.be  This test is no t yet approved or cleared by the [place] FDA and  has been authorized for detection and/or diagnosis of SARS-CoV-2 by FDA under an Emergency Use Authorization (EUA). This EUA will remain  in effect (meaning this test can be used) for the duration of the COVID-19 declaration under Section 564(b)(1) of the Act, 21 U.S.C.section 360bbb-3(b)(1), unless the authorization is terminated  or revoked sooner.  Influenza A by PCR NEGATIVE NEGATIVE Final   Influenza B by PCR NEGATIVE NEGATIVE Final    Comment: (NOTE) The Xpert Xpress SARS-CoV-2/FLU/RSV plus assay is intended as an aid in the diagnosis of influenza from Nasopharyngeal swab specimens and should not be used as a sole basis for treatment. Nasal washings and aspirates are unacceptable for Xpert Xpress SARS-CoV-2/FLU/RSV testing.  Fact Sheet for Patients: EntrepreneurPulse.com.au  Fact Sheet for Healthcare Providers: IncredibleEmployment.be  This test is not yet approved or cleared by the [place] FDA and has been authorized for detection and/or diagnosis of SARS-CoV-2 by FDA under an Emergency Use Authorization (EUA). This EUA will remain in effect (meaning this test can be used) for the duration of the COVID-19 declaration under Section 564(b)(1) of the Act, 21 U.S.C. section  360bbb-3(b)(1), unless the authorization is terminated or revoked.  Performed at [facility], [address] [place], [place] [address]   Culture, blood (routine x 2) Call MD if unable to obtain prior to antibiotics being given     Status: None (Preliminary result)   Collection Time: 08/04/20  9:21 PM   Specimen: BLOOD  Result Value Ref Range Status   Specimen Description BLOOD  RAC  Final   Special Requests   Final    BOTTLES DRAWN AEROBIC AND ANAEROBIC Blood Culture adequate volume   Culture   Final    NO GROWTH 3 DAYS Performed at [facility], [address] [place], [place] [address]    Report Status PENDING  Incomplete  Culture, blood (routine x 2) Call MD if unable to obtain prior to antibiotics being given     Status: None (Preliminary result)   Collection Time: 08/04/20  9:26 PM   Specimen: BLOOD  Result Value Ref Range Status   Specimen Description BLOOD  LEFT ARM  Final   Special Requests   Final    BOTTLES DRAWN AEROBIC AND ANAEROBIC Blood Culture adequate volume   Culture   Final    NO GROWTH 3 DAYS Performed at [facility], [address] [place],  [address]    Report Status PENDING  Incomplete     Labs: BNP (last 3 results) Recent Labs    08/04/20 1734  BNP 628.3*   Basic Metabolic Panel: Recent Labs  Lab 08/04/20 1734 08/05/20 0439 08/07/20 0545  NA 139 139  --   K 4.3 4.2  --   CL 106 107  --   CO2 22 23  --   GLUCOSE 206* 114*  --   BUN 23 23  --   CREATININE 1.20 1.20 1.06  CALCIUM 8.9 8.5*  --    Liver Function Tests: Recent Labs  Lab 08/04/20 1734  AST 43*  ALT 33  ALKPHOS 62  BILITOT 0.6  PROT 6.9  ALBUMIN 3.9   Recent Labs  Lab 08/04/20 1734  LIPASE 23   No results for input(s): AMMONIA in the last 168 hours. CBC: Recent Labs  Lab 08/04/20 1734 08/05/20 0439 08/07/20 0759  WBC 18.0* 18.0* 13.8*  NEUTROABS  --   --  10.3*  HGB 14.1 12.6* 13.4  HCT 43.1 38.5* 39.9  MCV 88.7  88.7 87.3  PLT 201 181 191   Cardiac Enzymes: No results for input(s): CKTOTAL, CKMB, CKMBINDEX, TROPONINI in the last 168 hours. BNP: Invalid input(s): POCBNP CBG: Recent Labs  Lab 08/06/20 1128 08/06/20 1617 08/06/20 2041 08/07/20 0809 08/07/20 1140  GLUCAP 161* 135* 159* 96 139*   D-Dimer  Recent Labs    08/04/20 1734  DDIMER 0.60*   Hgb A1c No results for input(s): HGBA1C in the last 72 hours. Lipid Profile No results for input(s): CHOL, HDL, LDLCALC, TRIG, CHOLHDL, LDLDIRECT in the last 72 hours. Thyroid function studies No results for input(s): TSH, T4TOTAL, T3FREE, THYROIDAB in the last 72 hours.  Invalid input(s): FREET3 Anemia work up No results for input(s): VITAMINB12, FOLATE, FERRITIN, TIBC, IRON, RETICCTPCT in the last 72 hours. Urinalysis    Component Value Date/Time   COLORURINE YELLOW (A) 05/19/2015 2122   APPEARANCEUR Clear 06/28/2020 1004   LABSPEC 1.024 05/19/2015 2122   PHURINE 5.0 05/19/2015 2122   GLUCOSEU Negative 06/28/2020 1004   HGBUR NEGATIVE 05/19/2015 2122   BILIRUBINUR Negative 06/28/2020 [place] 05/19/2015 2122   PROTEINUR 1+ (A) 06/28/2020 1004   PROTEINUR NEGATIVE 05/19/2015 2122   NITRITE Negative 06/28/2020 1004   NITRITE NEGATIVE 05/19/2015 2122   LEUKOCYTESUR Negative 06/28/2020 1004   Sepsis Labs Invalid input(s): PROCALCITONIN,  WBC,  LACTICIDVEN Microbiology Recent Results (from the past 240 hour(s))  Resp Panel by RT-PCR (Flu A&B, Covid) Nasopharyngeal Swab     Status: None   Collection Time: 08/04/20  5:33 PM   Specimen: Nasopharyngeal Swab; Nasopharyngeal(NP) swabs in vial transport medium  Result Value Ref Range Status   SARS Coronavirus 2 by RT PCR NEGATIVE NEGATIVE Final    Comment: (NOTE) SARS-CoV-2 target nucleic acids are NOT DETECTED.  The SARS-CoV-2 RNA is generally detectable in upper respiratory specimens during the acute phase of infection. The lowest concentration of SARS-CoV-2  viral copies this assay can detect is 138 copies/mL. A negative result does not preclude SARS-Cov-2 infection and should not be used as the sole basis for treatment or other patient management decisions. A negative result may occur with  improper specimen collection/handling, submission of specimen other than nasopharyngeal swab, presence of viral mutation(s) within the areas targeted by this assay, and inadequate number of viral copies(<138 copies/mL). A negative result must be combined with clinical observations, patient history, and epidemiological information. The expected result is Negative.  Fact Sheet for Patients:  EntrepreneurPulse.com.au  Fact Sheet for Healthcare Providers:  IncredibleEmployment.be  This test is no t yet approved or cleared by the [place] FDA and  has been authorized for detection and/or diagnosis of SARS-CoV-2 by FDA under an Emergency Use Authorization (EUA). This EUA will remain  in effect (meaning this test can be used) for the duration of the COVID-19 declaration under Section 564(b)(1) of the Act, 21 U.S.C.section 360bbb-3(b)(1), unless the authorization is terminated  or revoked sooner.       Influenza A by PCR NEGATIVE NEGATIVE Final   Influenza B by PCR NEGATIVE NEGATIVE Final    Comment: (NOTE) The Xpert Xpress SARS-CoV-2/FLU/RSV plus assay is intended as an aid in the diagnosis of influenza from Nasopharyngeal swab specimens and should not be used as a sole basis for treatment. Nasal washings and aspirates are unacceptable for Xpert Xpress SARS-CoV-2/FLU/RSV testing.  Fact Sheet for Patients: EntrepreneurPulse.com.au  Fact Sheet for Healthcare Providers: IncredibleEmployment.be  This test is not yet approved or cleared by the [place] FDA and has been authorized for detection and/or diagnosis of SARS-CoV-2 by FDA under an Emergency Use Authorization  (EUA). This EUA will remain in effect (meaning this test can be used) for the duration of the COVID-19 declaration under Section 564(b)(1) of the Act, 21 U.S.C. section 360bbb-3(b)(1), unless the authorization is terminated or revoked.  Performed at  [facility], [address] [place], [place] [address]   Culture, blood (routine x 2) Call MD if unable to obtain prior to antibiotics being given     Status: None (Preliminary result)   Collection Time: 08/04/20  9:21 PM   Specimen: BLOOD  Result Value Ref Range Status   Specimen Description BLOOD  RAC  Final   Special Requests   Final    BOTTLES DRAWN AEROBIC AND ANAEROBIC Blood Culture adequate volume   Culture   Final    NO GROWTH 3 DAYS Performed at [facility], [address] [place], [place] [address]    Report Status PENDING  Incomplete  Culture, blood (routine x 2) Call MD if unable to obtain prior to antibiotics being given     Status: None (Preliminary result)   Collection Time: 08/04/20  9:26 PM   Specimen: BLOOD  Result Value Ref Range Status   Specimen Description BLOOD  LEFT ARM  Final   Special Requests   Final    BOTTLES DRAWN AEROBIC AND ANAEROBIC Blood Culture adequate volume   Culture   Final    NO GROWTH 3 DAYS Performed at [facility], [address], [place], [place] [address]    Report Status PENDING  Incomplete     Time coordinating discharge: Over 30 minutes  SIGNED:   Sidney Ace, MD  [facility] 08/07/2020, 3:23 PM Pager   If 7PM-7AM, please contact night-coverage

## 2020-08-07 NOTE — Progress Notes (Signed)
Patient stable, VSS. >90% SPO2 on room air with ambulation. Pain has improved in neck with interventions. D/C home order verified. AVS reviewed with patient including d/c instructions, medications, and follow up recommendations; verbalized understanding. Escorted to medical mall entrance by volunteer, daughter picking patient up.

## 2020-08-08 ENCOUNTER — Other Ambulatory Visit: Payer: Medicare Other

## 2020-08-08 LAB — SURGICAL PATHOLOGY

## 2020-08-09 LAB — CULTURE, BLOOD (ROUTINE X 2)
Culture: NO GROWTH
Culture: NO GROWTH
Special Requests: ADEQUATE
Special Requests: ADEQUATE

## 2020-08-18 ENCOUNTER — Ambulatory Visit (INDEPENDENT_AMBULATORY_CARE_PROVIDER_SITE_OTHER): Payer: Medicare Other | Admitting: Physician Assistant

## 2020-08-18 ENCOUNTER — Encounter: Payer: Self-pay | Admitting: Physician Assistant

## 2020-08-18 ENCOUNTER — Other Ambulatory Visit: Payer: Self-pay

## 2020-08-18 VITALS — BP 148/80 | HR 60 | Temp 98.8°F | Ht 69.0 in | Wt 179.0 lb

## 2020-08-18 DIAGNOSIS — K8070 Calculus of gallbladder and bile duct without cholecystitis without obstruction: Secondary | ICD-10-CM

## 2020-08-18 DIAGNOSIS — Z09 Encounter for follow-up examination after completed treatment for conditions other than malignant neoplasm: Secondary | ICD-10-CM

## 2020-08-18 DIAGNOSIS — K8040 Calculus of bile duct with cholecystitis, unspecified, without obstruction: Secondary | ICD-10-CM

## 2020-08-18 NOTE — Patient Instructions (Signed)
Please call our office if you have any questions or concerns.  GENERAL POST-OPERATIVE PATIENT INSTRUCTIONS   WOUND CARE INSTRUCTIONS:  Keep a dry clean dressing on the wound if there is drainage. The initial bandage may be removed after 24 hours.  Once the wound has quit draining you may leave it open to air.  If clothing rubs against the wound or causes irritation and the wound is not draining you may cover it with a dry dressing during the daytime.  Try to keep the wound dry and avoid ointments on the wound unless directed to do so.  If the wound becomes bright red and painful or starts to drain infected material that is not clear, please contact your physician immediately.  If the wound is mildly pink and has a thick firm ridge underneath it, this is normal, and is referred to as a healing ridge.  This will resolve over the next 4-6 weeks.  BATHING: You may shower if you have been informed of this by your surgeon. However, Please do not submerge in a tub, hot tub, or pool until incisions are completely sealed or have been told by your surgeon that you may do so.  DIET:  You may eat any foods that you can tolerate.  It is a good idea to eat a high fiber diet and take in plenty of fluids to prevent constipation.  If you do become constipated you may want to take a mild laxative or take ducolax tablets on a daily basis until your bowel habits are regular.  Constipation can be very uncomfortable, along with straining, after recent surgery.  ACTIVITY:  You are encouraged to cough and deep breath or use your incentive spirometer if you were given one, every 15-30 minutes when awake.  This will help prevent respiratory complications and low grade fevers post-operatively if you had a general anesthetic.  You may want to hug a pillow when coughing and sneezing to add additional support to the surgical area, if you had abdominal or chest surgery, which will decrease pain during these times.  You are encouraged  to walk and engage in light activity for the next two weeks.  You should not lift more than 20 pounds, until 09/15/2020 as it could put you at increased risk for complications.  Twenty pounds is roughly equivalent to a plastic bag of groceries. At that time- Listen to your body when lifting, if you have pain when lifting, stop and then try again in a few days. Soreness after doing exercises or activities of daily living is normal as you get back in to your normal routine.  MEDICATIONS:  Try to take narcotic medications and anti-inflammatory medications, such as tylenol, ibuprofen, naprosyn, etc., with food.  This will minimize stomach upset from the medication.  Should you develop nausea and vomiting from the pain medication, or develop a rash, please discontinue the medication and contact your physician.  You should not drive, make important decisions, or operate machinery when taking narcotic pain medication.  SUNBLOCK Use sun block to incision area over the next year if this area will be exposed to sun. This helps decrease scarring and will allow you avoid a permanent darkened area over your incision.  QUESTIONS:  Please feel free to call our office if you have any questions, and we will be glad to assist you. [phone]

## 2020-08-18 NOTE — Progress Notes (Signed)
[facility] POST-OP OFFICE VISIT  08/18/2020  HPI: Markies Mowatt is a 75 y.o. male 14 days s/p laparoscopic cholecystectomy for choledocholithiasis and cholecystitis with Dr Hampton Abbot which was complicated by post-operative hospitalization for hypoxia.   He has done reasonably well since surgery He did report neck pain and thought it was attributable to gas pain. PCP got an XR which was concerning for arthritis Only other complaint is continued fatigued No fever, chills, nausea, emesis, diarrhea, nor abdominal pain.  No issues with PO intake Ambulating well  Vital signs: BP (!) 148/80   Pulse 60   Temp 98.8 F (37.1 C) (Oral)   Ht 5\' 9"  (1.753 m)   Wt 179 lb (81.2 kg)   SpO2 96%   BMI 26.43 kg/m    Physical Exam: Constitutional: Well appearing male, NAD Abdomen: Soft, non-tender, non-distended, no rebound/guarding Skin: Laparoscopic incisions are CDI, no erythema or drainage  Assessment/Plan: This is a 75 y.o. male 4 days s/p laparoscopic cholecystectomy for choledocholithiasis and cholecystitis which was complicated by post-operative hospitalization for hypoxia   - Pain control prn; OTC medications  - Reviewed wound care  - Reviewed lifting restrictions; 4 weeks total  - Reviewed surgical pathology; [place]  - He will follow up with surgery clinic on an as needed basis   -- Edison Simon, PA-C [facility] 08/18/2020, 10:11 AM [phone] M-F: 7am - 4pm

## 2020-08-18 NOTE — Anesthesia Postprocedure Evaluation (Signed)
Anesthesia Post Note  Patient: Jimmy Mcintyre  Procedure(s) Performed: XI ROBOTIC ASSISTED LAPAROSCOPIC CHOLECYSTECTOMY with Cholangiogram INDOCYANINE GREEN FLUORESCENCE IMAGING (ICG) cholangeogram  Patient location during evaluation: PACU Anesthesia Type: General Level of consciousness: awake and alert Pain management: pain level controlled Vital Signs Assessment: post-procedure vital signs reviewed and stable Respiratory status: spontaneous breathing, nonlabored ventilation, respiratory function stable and patient connected to nasal cannula oxygen Cardiovascular status: blood pressure returned to baseline and stable Postop Assessment: no apparent nausea or vomiting Anesthetic complications: no Comments: Pt initially w lower O2 sats, eventually stable on RA  No notable events documented.   Last Vitals:  Vitals:   08/07/20 0811 08/07/20 1143  BP: (!) 155/71 134/73  Pulse: 78 67  Resp: 18 18  Temp: 37.2 C 36.9 C  SpO2: 91% 92%    Last Pain:  Vitals:   08/07/20 1143  TempSrc: Oral  PainSc:                  Alphonsus Sias

## 2020-11-03 ENCOUNTER — Telehealth: Payer: Self-pay | Admitting: Gastroenterology

## 2020-11-03 NOTE — Telephone Encounter (Signed)
Pt. Requesting call back about a medicine that his insurance doesn't cover

## 2020-11-08 NOTE — Telephone Encounter (Signed)
Pt. Says that he had a procedure and the Dr. Vonzella Nipple him some suppositories. He said that his insurance company will not pay for them because they are over 1000.  Marland Kitchen He said they told him that he needs prior authorization before they can cover it.

## 2020-11-08 NOTE — Telephone Encounter (Signed)
Please find out from patient what medication because we have not prescribed anything or saw patient since may

## 2020-11-09 ENCOUNTER — Other Ambulatory Visit: Payer: Self-pay

## 2020-11-09 NOTE — Telephone Encounter (Signed)
Spoke with pt regarding the suppositories that were administered at the hospital during his ERCP. Advised the patient that I don't have anything to do with the authorization for the medications at the hospital. Pt will contact the billing department to discuss.

## 2021-06-05 ENCOUNTER — Ambulatory Visit: Payer: Medicare Other | Admitting: Urology

## 2021-06-05 ENCOUNTER — Encounter: Payer: Self-pay | Admitting: Urology

## 2022-01-23 ENCOUNTER — Other Ambulatory Visit: Payer: Self-pay | Admitting: Physical Medicine & Rehabilitation

## 2022-01-23 DIAGNOSIS — G8929 Other chronic pain: Secondary | ICD-10-CM

## 2022-02-01 ENCOUNTER — Other Ambulatory Visit: Payer: Self-pay | Admitting: Physical Medicine & Rehabilitation

## 2022-02-01 DIAGNOSIS — G8929 Other chronic pain: Secondary | ICD-10-CM

## 2022-02-02 ENCOUNTER — Ambulatory Visit: Admission: RE | Admit: 2022-02-02 | Payer: Medicare Other | Source: Ambulatory Visit

## 2022-02-12 ENCOUNTER — Ambulatory Visit
Admission: RE | Admit: 2022-02-12 | Discharge: 2022-02-12 | Disposition: A | Discharge status: Home / Self Care | Payer: Medicare Other | Source: Ambulatory Visit | Attending: Physical Medicine & Rehabilitation | Admitting: Physical Medicine & Rehabilitation

## 2022-02-12 DIAGNOSIS — G8929 Other chronic pain: Secondary | ICD-10-CM | POA: Diagnosis present

## 2022-02-12 DIAGNOSIS — M5442 Lumbago with sciatica, left side: Secondary | ICD-10-CM | POA: Diagnosis present

## 2022-02-12 DIAGNOSIS — M5441 Lumbago with sciatica, right side: Secondary | ICD-10-CM | POA: Diagnosis present

## 2022-04-05 ENCOUNTER — Other Ambulatory Visit
Admission: RE | Admit: 2022-04-05 | Discharge: 2022-04-05 | Disposition: A | Discharge status: Home / Self Care | Payer: Medicare Other | Source: Ambulatory Visit | Attending: Internal Medicine | Admitting: Internal Medicine

## 2022-04-05 DIAGNOSIS — Z01818 Encounter for other preprocedural examination: Secondary | ICD-10-CM | POA: Diagnosis present

## 2022-04-05 DIAGNOSIS — I509 Heart failure, unspecified: Secondary | ICD-10-CM | POA: Insufficient documentation

## 2022-04-05 LAB — BRAIN NATRIURETIC PEPTIDE: B Natriuretic Peptide: 34.6 pg/mL (ref 0.0–100.0)

## 2022-04-20 ENCOUNTER — Encounter
Admission: AD | Disposition: A | Discharge status: Home / Self Care | Payer: Self-pay | Source: Home / Self Care | Attending: Internal Medicine

## 2022-04-20 ENCOUNTER — Encounter: Payer: Self-pay | Admitting: Internal Medicine

## 2022-04-20 ENCOUNTER — Inpatient Hospital Stay
Admission: AD | Admit: 2022-04-20 | Discharge: 2022-04-21 | DRG: 251 | Disposition: A | Discharge status: Home / Self Care | Payer: Medicare Other | Attending: Internal Medicine | Admitting: Internal Medicine

## 2022-04-20 ENCOUNTER — Other Ambulatory Visit: Payer: Self-pay

## 2022-04-20 DIAGNOSIS — Z803 Family history of malignant neoplasm of breast: Secondary | ICD-10-CM

## 2022-04-20 DIAGNOSIS — I10 Essential (primary) hypertension: Secondary | ICD-10-CM | POA: Diagnosis present

## 2022-04-20 DIAGNOSIS — Z9861 Coronary angioplasty status: Secondary | ICD-10-CM | POA: Diagnosis present

## 2022-04-20 DIAGNOSIS — T82855A Stenosis of coronary artery stent, initial encounter: Principal | ICD-10-CM | POA: Diagnosis present

## 2022-04-20 DIAGNOSIS — I2510 Atherosclerotic heart disease of native coronary artery without angina pectoris: Secondary | ICD-10-CM | POA: Diagnosis present

## 2022-04-20 DIAGNOSIS — Z8249 Family history of ischemic heart disease and other diseases of the circulatory system: Secondary | ICD-10-CM

## 2022-04-20 DIAGNOSIS — Z955 Presence of coronary angioplasty implant and graft: Secondary | ICD-10-CM | POA: Diagnosis not present

## 2022-04-20 DIAGNOSIS — Z882 Allergy status to sulfonamides status: Secondary | ICD-10-CM | POA: Diagnosis not present

## 2022-04-20 DIAGNOSIS — Z791 Long term (current) use of non-steroidal anti-inflammatories (NSAID): Secondary | ICD-10-CM | POA: Diagnosis not present

## 2022-04-20 DIAGNOSIS — E119 Type 2 diabetes mellitus without complications: Secondary | ICD-10-CM | POA: Diagnosis present

## 2022-04-20 DIAGNOSIS — M47816 Spondylosis without myelopathy or radiculopathy, lumbar region: Secondary | ICD-10-CM | POA: Diagnosis present

## 2022-04-20 DIAGNOSIS — Z79899 Other long term (current) drug therapy: Secondary | ICD-10-CM

## 2022-04-20 DIAGNOSIS — Z96612 Presence of left artificial shoulder joint: Secondary | ICD-10-CM | POA: Diagnosis present

## 2022-04-20 DIAGNOSIS — Z881 Allergy status to other antibiotic agents status: Secondary | ICD-10-CM

## 2022-04-20 DIAGNOSIS — Z96611 Presence of right artificial shoulder joint: Secondary | ICD-10-CM | POA: Diagnosis present

## 2022-04-20 DIAGNOSIS — Z7984 Long term (current) use of oral hypoglycemic drugs: Secondary | ICD-10-CM

## 2022-04-20 DIAGNOSIS — Z807 Family history of other malignant neoplasms of lymphoid, hematopoietic and related tissues: Secondary | ICD-10-CM

## 2022-04-20 DIAGNOSIS — M47812 Spondylosis without myelopathy or radiculopathy, cervical region: Secondary | ICD-10-CM | POA: Diagnosis present

## 2022-04-20 DIAGNOSIS — Y713 Surgical instruments, materials and cardiovascular devices (including sutures) associated with adverse incidents: Secondary | ICD-10-CM | POA: Diagnosis present

## 2022-04-20 DIAGNOSIS — N400 Enlarged prostate without lower urinary tract symptoms: Secondary | ICD-10-CM | POA: Diagnosis present

## 2022-04-20 DIAGNOSIS — I071 Rheumatic tricuspid insufficiency: Secondary | ICD-10-CM | POA: Diagnosis present

## 2022-04-20 DIAGNOSIS — E785 Hyperlipidemia, unspecified: Secondary | ICD-10-CM | POA: Diagnosis present

## 2022-04-20 DIAGNOSIS — R9430 Abnormal result of cardiovascular function study, unspecified: Secondary | ICD-10-CM

## 2022-04-20 DIAGNOSIS — Z833 Family history of diabetes mellitus: Secondary | ICD-10-CM

## 2022-04-20 HISTORY — PX: CORONARY BALLOON ANGIOPLASTY: CATH118233

## 2022-04-20 HISTORY — PX: LEFT HEART CATH AND CORONARY ANGIOGRAPHY: CATH118249

## 2022-04-20 LAB — POCT ACTIVATED CLOTTING TIME
Activated Clotting Time: 233 seconds
Activated Clotting Time: 314 seconds
Activated Clotting Time: 347 seconds

## 2022-04-20 LAB — GLUCOSE, CAPILLARY: Glucose-Capillary: 131 mg/dL — ABNORMAL HIGH (ref 70–99)

## 2022-04-20 SURGERY — LEFT HEART CATH AND CORONARY ANGIOGRAPHY
Anesthesia: Moderate Sedation

## 2022-04-20 MED ORDER — VERAPAMIL HCL 2.5 MG/ML IV SOLN
INTRAVENOUS | Status: DC | PRN
  Administered 2022-04-20: 2.5 mg via INTRA_ARTERIAL

## 2022-04-20 MED ORDER — SODIUM CHLORIDE 0.9% FLUSH
3.0000 mL | Freq: Two times a day (BID) | INTRAVENOUS | Status: DC
  Administered 2022-04-20 – 2022-04-21 (×2): 3 mL via INTRAVENOUS

## 2022-04-20 MED ORDER — HEPARIN SODIUM (PORCINE) 1000 UNIT/ML IJ SOLN
INTRAMUSCULAR | Status: AC
  Filled 2022-04-20: qty 10

## 2022-04-20 MED ORDER — ASPIRIN 81 MG PO CHEW: 81.0000 mg | CHEWABLE_TABLET | ORAL | Status: AC

## 2022-04-20 MED ORDER — GEMFIBROZIL 600 MG PO TABS
600.0000 mg | ORAL_TABLET | Freq: Two times a day (BID) | ORAL | Status: DC
  Administered 2022-04-21: 600 mg via ORAL
  Filled 2022-04-20 (×2): qty 1

## 2022-04-20 MED ORDER — SODIUM CHLORIDE 0.9 % IV SOLN: 250.0000 mL | INTRAVENOUS | Status: DC | PRN

## 2022-04-20 MED ORDER — SODIUM CHLORIDE 0.9 % IV SOLN
INTRAVENOUS | Status: AC | PRN
  Administered 2022-04-20: 250 mL via INTRAVENOUS

## 2022-04-20 MED ORDER — ZOLPIDEM TARTRATE 5 MG PO TABS
5.0000 mg | ORAL_TABLET | Freq: Every evening | ORAL | Status: DC | PRN
  Administered 2022-04-20: 5 mg via ORAL
  Filled 2022-04-20: qty 1

## 2022-04-20 MED ORDER — TICAGRELOR 90 MG PO TABS
ORAL_TABLET | ORAL | Status: AC
  Filled 2022-04-20: qty 2

## 2022-04-20 MED ORDER — ASPIRIN 81 MG PO CHEW
CHEWABLE_TABLET | ORAL | Status: AC
  Administered 2022-04-20: 81 mg via ORAL
  Filled 2022-04-20: qty 1

## 2022-04-20 MED ORDER — TICAGRELOR 90 MG PO TABS
ORAL_TABLET | ORAL | Status: DC | PRN
  Administered 2022-04-20: 180 mg via ORAL

## 2022-04-20 MED ORDER — PANTOPRAZOLE SODIUM 40 MG PO TBEC
40.0000 mg | DELAYED_RELEASE_TABLET | Freq: Every day | ORAL | Status: DC
  Administered 2022-04-21: 40 mg via ORAL
  Filled 2022-04-20: qty 1

## 2022-04-20 MED ORDER — SODIUM CHLORIDE 0.9% FLUSH
3.0000 mL | Freq: Two times a day (BID) | INTRAVENOUS | Status: DC
  Administered 2022-04-21: 3 mL via INTRAVENOUS

## 2022-04-20 MED ORDER — ONDANSETRON HCL 4 MG/2ML IJ SOLN: 4.0000 mg | Freq: Four times a day (QID) | INTRAMUSCULAR | Status: DC | PRN

## 2022-04-20 MED ORDER — VERAPAMIL HCL 2.5 MG/ML IV SOLN
INTRAVENOUS | Status: AC
  Filled 2022-04-20: qty 2

## 2022-04-20 MED ORDER — SODIUM CHLORIDE 0.9 % WEIGHT BASED INFUSION
3.0000 mL/kg/h | INTRAVENOUS | Status: AC
  Administered 2022-04-20: 3 mL/kg/h via INTRAVENOUS

## 2022-04-20 MED ORDER — TICAGRELOR 90 MG PO TABS
90.0000 mg | ORAL_TABLET | Freq: Two times a day (BID) | ORAL | Status: DC
  Administered 2022-04-20 – 2022-04-21 (×2): 90 mg via ORAL
  Filled 2022-04-20 (×2): qty 1

## 2022-04-20 MED ORDER — AMLODIPINE BESYLATE 5 MG PO TABS
5.0000 mg | ORAL_TABLET | Freq: Every day | ORAL | Status: DC
  Administered 2022-04-21: 5 mg via ORAL
  Filled 2022-04-20: qty 1

## 2022-04-20 MED ORDER — MELOXICAM 7.5 MG PO TABS
15.0000 mg | ORAL_TABLET | Freq: Every morning | ORAL | Status: DC
  Administered 2022-04-21: 15 mg via ORAL
  Filled 2022-04-20: qty 2

## 2022-04-20 MED ORDER — FINASTERIDE 5 MG PO TABS
5.0000 mg | ORAL_TABLET | Freq: Every morning | ORAL | Status: DC
  Administered 2022-04-21: 5 mg via ORAL
  Filled 2022-04-20: qty 1

## 2022-04-20 MED ORDER — ASPIRIN 81 MG PO CHEW
81.0000 mg | CHEWABLE_TABLET | Freq: Every day | ORAL | Status: DC
  Administered 2022-04-21: 81 mg via ORAL
  Filled 2022-04-20: qty 1

## 2022-04-20 MED ORDER — FENTANYL CITRATE (PF) 100 MCG/2ML IJ SOLN
INTRAMUSCULAR | Status: DC | PRN
  Administered 2022-04-20: 50 ug via INTRAVENOUS

## 2022-04-20 MED ORDER — ACETAMINOPHEN 325 MG PO TABS
650.0000 mg | ORAL_TABLET | ORAL | Status: DC | PRN
  Administered 2022-04-20: 650 mg via ORAL
  Filled 2022-04-20: qty 2

## 2022-04-20 MED ORDER — ACETAMINOPHEN 500 MG PO TABS: 1000.0000 mg | ORAL_TABLET | Freq: Four times a day (QID) | ORAL | Status: DC | PRN

## 2022-04-20 MED ORDER — FENTANYL CITRATE (PF) 100 MCG/2ML IJ SOLN
INTRAMUSCULAR | Status: AC
  Filled 2022-04-20: qty 2

## 2022-04-20 MED ORDER — HEPARIN (PORCINE) IN NACL 1000-0.9 UT/500ML-% IV SOLN
INTRAVENOUS | Status: AC
  Filled 2022-04-20: qty 1000

## 2022-04-20 MED ORDER — BENAZEPRIL HCL 20 MG PO TABS
20.0000 mg | ORAL_TABLET | Freq: Every day | ORAL | Status: DC
  Administered 2022-04-21: 20 mg via ORAL
  Filled 2022-04-20: qty 1

## 2022-04-20 MED ORDER — LABETALOL HCL 5 MG/ML IV SOLN
INTRAVENOUS | Status: AC
  Administered 2022-04-20: 10 mg via INTRAVENOUS
  Filled 2022-04-20: qty 4

## 2022-04-20 MED ORDER — CYANOCOBALAMIN 1000 MCG/ML IJ SOLN
1000.0000 ug | INTRAMUSCULAR | Status: DC
  Administered 2022-04-21: 1000 ug via INTRAMUSCULAR
  Filled 2022-04-20: qty 1

## 2022-04-20 MED ORDER — MIDAZOLAM HCL 2 MG/2ML IJ SOLN
INTRAMUSCULAR | Status: DC | PRN
  Administered 2022-04-20 (×2): 1 mg via INTRAVENOUS

## 2022-04-20 MED ORDER — LABETALOL HCL 5 MG/ML IV SOLN: 10.0000 mg | INTRAVENOUS | Status: AC | PRN

## 2022-04-20 MED ORDER — MIDAZOLAM HCL 2 MG/2ML IJ SOLN
INTRAMUSCULAR | Status: AC
  Filled 2022-04-20: qty 2

## 2022-04-20 MED ORDER — IOHEXOL 300 MG/ML  SOLN
INTRAMUSCULAR | Status: DC | PRN
  Administered 2022-04-20: 397 mL

## 2022-04-20 MED ORDER — HYDRALAZINE HCL 20 MG/ML IJ SOLN: 10.0000 mg | INTRAMUSCULAR | Status: AC | PRN

## 2022-04-20 MED ORDER — HEPARIN (PORCINE) IN NACL 1000-0.9 UT/500ML-% IV SOLN
INTRAVENOUS | Status: DC | PRN
  Administered 2022-04-20 (×3): 500 mL

## 2022-04-20 MED ORDER — SODIUM CHLORIDE 0.9 % WEIGHT BASED INFUSION
1.0000 mL/kg/h | INTRAVENOUS | Status: DC
  Administered 2022-04-20: 1 mL/kg/h via INTRAVENOUS

## 2022-04-20 MED ORDER — ALLOPURINOL 300 MG PO TABS
300.0000 mg | ORAL_TABLET | Freq: Every evening | ORAL | Status: DC
  Filled 2022-04-20 (×2): qty 1

## 2022-04-20 MED ORDER — ASPIRIN 81 MG PO CHEW
CHEWABLE_TABLET | ORAL | Status: AC
  Filled 2022-04-20: qty 3

## 2022-04-20 MED ORDER — SODIUM CHLORIDE 0.9% FLUSH: 3.0000 mL | INTRAVENOUS | Status: DC | PRN

## 2022-04-20 MED ORDER — SODIUM CHLORIDE 0.9 % WEIGHT BASED INFUSION
1.0000 mL/kg/h | INTRAVENOUS | Status: AC
  Administered 2022-04-20: 1 mL/kg/h via INTRAVENOUS

## 2022-04-20 MED ORDER — HEPARIN SODIUM (PORCINE) 1000 UNIT/ML IJ SOLN
INTRAMUSCULAR | Status: DC | PRN
  Administered 2022-04-20: 5000 [IU] via INTRAVENOUS
  Administered 2022-04-20: 4000 [IU] via INTRAVENOUS
  Administered 2022-04-20: 5000 [IU] via INTRAVENOUS

## 2022-04-20 MED ORDER — ASPIRIN 81 MG PO CHEW
CHEWABLE_TABLET | ORAL | Status: DC | PRN
  Administered 2022-04-20: 243 mg via ORAL

## 2022-04-20 MED ORDER — HEPARIN (PORCINE) IN NACL 1000-0.9 UT/500ML-% IV SOLN
INTRAVENOUS | Status: AC
  Filled 2022-04-20: qty 500

## 2022-04-20 SURGICAL SUPPLY — 26 items
BALLN EUPHORA RX 2.0X12 (BALLOONS) ×2
BALLN EUPHORA RX 2.0X20 (BALLOONS) ×2
BALLN MINITREK RX 2.0X12 (BALLOONS) ×2
BALLN TREK RX 2.5X12 (BALLOONS) ×2
BALLOON EUPHORA RX 2.0X12 (BALLOONS) IMPLANT
BALLOON EUPHORA RX 2.0X20 (BALLOONS) IMPLANT
BALLOON MINITREK RX 2.0X12 (BALLOONS) IMPLANT
BALLOON TREK RX 2.5X12 (BALLOONS) IMPLANT
CATH 5F 110X4 TIG (CATHETERS) IMPLANT
CATH 5FR JL3.5 JR4 ANG PIG MP (CATHETERS) IMPLANT
CATH INFINITI 5 FR 3DRC (CATHETERS) IMPLANT
CATH INFINITI 5 FR MPA2 (CATHETERS) IMPLANT
CATH TELESCOPE 6F GEC (CATHETERS) IMPLANT
CATH VISTA GUIDE 6FR XB3.5 (CATHETERS) IMPLANT
DEVICE RAD TR BAND REGULAR (VASCULAR PRODUCTS) IMPLANT
DRAPE BRACHIAL (DRAPES) IMPLANT
GLIDESHEATH SLEND SS 6F .021 (SHEATH) IMPLANT
GUIDEWIRE INQWIRE 1.5J.035X260 (WIRE) IMPLANT
INQWIRE 1.5J .035X260CM (WIRE) ×4
PACK CARDIAC CATH (CUSTOM PROCEDURE TRAY) ×2 IMPLANT
PROTECTION STATION PRESSURIZED (MISCELLANEOUS) ×2
SET ATX SIMPLICITY (MISCELLANEOUS) IMPLANT
STATION PROTECTION PRESSURIZED (MISCELLANEOUS) IMPLANT
TUBING CIL FLEX 10 FLL-RA (TUBING) IMPLANT
WIRE ASAHI GRAND SLAM 180CM (WIRE) IMPLANT
WIRE G HI TQ BMW 190 (WIRE) IMPLANT

## 2022-04-20 NOTE — CV Procedure (Signed)
Brief cath note Outpatient diagnostic cardiac cath Right radial approach  Left ventriculogram Mild anterior apical hypoejection fraction around 50%  Coronaries Left main Short free of disease LAD large with mid in-stent restenosis of 95% TIMI-3 flow Circumflex large minor irregularities RCA large minor irregularities Right dominant system All vessels with TIMI-3 flow  Intervention Successful PCI of in-stent restenosis lesion reduced from 99 down to 25% in-stent Unsuccessful attempt at stent deployment unable to cross  Patient placed on aspirin Brilinta Transfer to telemetry for overnight stay Anticipated discharge tomorrow  Consider returning for staged procedure as an outpatient preferably from the groin for better support and attempt at stenting the in-stent area  Full note to follow

## 2022-04-20 NOTE — Progress Notes (Signed)
Please advise patient that its a shared room with shared bathroom

## 2022-04-20 NOTE — Progress Notes (Signed)
Pt arrived to room from cath lab in stable condition. A/Ox4. R Rad site level 0. Pt oriented to room. Call bell within reach.

## 2022-04-21 LAB — BASIC METABOLIC PANEL
Anion gap: 7 (ref 5–15)
BUN: 20 mg/dL (ref 8–23)
CO2: 22 mmol/L (ref 22–32)
Calcium: 8.9 mg/dL (ref 8.9–10.3)
Chloride: 110 mmol/L (ref 98–111)
Creatinine, Ser: 0.95 mg/dL (ref 0.61–1.24)
GFR, Estimated: >60 mL/min (ref >60)
Glucose, Bld: 132 mg/dL — ABNORMAL HIGH (ref 70–99)
Potassium: 3.9 mmol/L (ref 3.5–5.1)
Sodium: 139 mmol/L (ref 135–145)

## 2022-04-21 LAB — CBC
HCT: 40 % (ref 39.0–52.0)
Hemoglobin: 13.1 g/dL (ref 13.0–17.0)
MCH: 28.5 pg (ref 26.0–34.0)
MCHC: 32.8 g/dL (ref 30.0–36.0)
MCV: 87.1 fL (ref 80.0–100.0)
Platelets: 224 K/uL (ref 150–400)
RBC: 4.59 MIL/uL (ref 4.22–5.81)
RDW: 14.3 % (ref 11.5–15.5)
WBC: 9.9 K/uL (ref 4.0–10.5)
nRBC: 0 % (ref 0.0–0.2)

## 2022-04-21 MED ORDER — TICAGRELOR 90 MG PO TABS: 90.0000 mg | ORAL_TABLET | Freq: Two times a day (BID) | ORAL | 3 refills | Status: DC

## 2022-04-21 MED ORDER — ASPIRIN 81 MG PO CHEW: 81.0000 mg | CHEWABLE_TABLET | Freq: Every day | ORAL | 11 refills | Status: DC

## 2022-04-21 MED ORDER — ROSUVASTATIN CALCIUM 5 MG PO TABS: 5.0000 mg | ORAL_TABLET | Freq: Every day | ORAL | Status: DC

## 2022-04-21 MED ORDER — ROSUVASTATIN CALCIUM 5 MG PO TABS: 5.0000 mg | ORAL_TABLET | Freq: Every day | ORAL | 6 refills | Status: DC

## 2022-04-21 NOTE — Discharge Summary (Signed)
Physician Discharge Summary      Patient ID: Jimmy Mcintyre MRN: [id number] DOB/AGE: [birth year] 77 y.o.  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  Further evaluation will be considered if symptoms persist will stage repeat intervention of the in-stent restenosis with the hope of placing another stent.  Patient states no further left arm numbness and is ready for discharge   Discharge Exam: Blood pressure (!) 142/69, pulse 66, temperature 97.9 F (36.6 C), temperature source Oral, resp. rate 18, height 5' 9"$  (1.753 m), weight 78.5 kg, SpO2 97 %.   General appearance: appears stated age Neck: no adenopathy, no carotid bruit, no JVD, supple, symmetrical, trachea midline, and thyroid not enlarged, symmetric, no tenderness/mass/nodules Resp: clear to auscultation bilaterally Cardio: regular rate and rhythm, S1, S2 normal, no murmur, click, rub or gallop GI: soft, non-tender; bowel sounds normal; no masses,  no organomegaly Extremities: extremities normal, atraumatic, no cyanosis or edema Pulses: 2+ and symmetric Neurologic: Alert and oriented X 3, normal strength and tone. Normal symmetric reflexes. Normal coordination and gait Labs:   Lab Results  Component Value Date   WBC 9.9 04/21/2022   HGB 13.1 04/21/2022   HCT 40.0 04/21/2022   MCV 87.1 04/21/2022   PLT 224  04/21/2022    Recent Labs  Lab 04/21/22 0600  NA 139  K 3.9  CL 110  CO2 22  BUN 20  CREATININE 0.95  CALCIUM 8.9  GLUCOSE 132*      Radiology:  EKG: Normal sinus rhythm nonspecific ST-T changes rate of 65  FOLLOW UP PLANS AND APPOINTMENTS Discharge Instructions     AMB Referral to Cardiac Rehabilitation - Phase II   Complete by: As directed    Diagnosis: PTCA   After initial evaluation and assessments completed: Virtual Based Care may be provided alone or in conjunction with Phase 2 Cardiac Rehab based on patient barriers.: Yes      Allergies as of 04/21/2022       Reactions   Bactrim [sulfamethoxazole-trimethoprim] Rash   Levofloxacin Rash        Medication List     STOP taking these medications    aspirin 325 MG tablet Replaced by: aspirin 81 MG chewable tablet   atorvastatin 10 MG tablet Commonly known as: LIPITOR   glimepiride 4 MG tablet Commonly known as: AMARYL   lidocaine 5 % Commonly known as: LIDODERM   metoprolol tartrate 25 MG tablet Commonly known as: LOPRESSOR   pioglitazone 30 MG tablet Commonly known as: ACTOS   tamsulosin 0.4 MG Caps capsule Commonly known as: FLOMAX       TAKE these medications    acetaminophen 500 MG tablet Commonly known as: TYLENOL Take 1,000 mg by mouth every 6 (six) hours as needed for moderate pain. What changed: Another medication with the same name was removed. Continue taking this medication, and follow the directions you see here.   allopurinol 300 MG tablet Commonly known as: ZYLOPRIM Take 300 mg  by mouth every evening.   amLODipine-benazepril 5-20 MG capsule Commonly known as: LOTREL Take 1 capsule by mouth in the morning.   aspirin 81 MG chewable tablet Chew 1 tablet (81 mg total) by mouth daily. Start taking on: April 22, 2022 Replaces: aspirin 325 MG tablet   cyanocobalamin 1000 MCG/ML injection Commonly known as: VITAMIN B12 Inject 1,000 mcg into the muscle every 30 (thirty)  days.   finasteride 5 MG tablet Commonly known as: PROSCAR Take 5 mg by mouth in the morning.   gemfibrozil 600 MG tablet Commonly known as: LOPID Take 600 mg by mouth 2 (two) times daily before a meal.   meloxicam 15 MG tablet Commonly known as: MOBIC Take 15 mg by mouth in the morning.   omeprazole 20 MG capsule Commonly known as: PRILOSEC Take 20 mg by mouth in the morning.   SYRINGE/NEEDLE (DISP) 1 ML 25G X 5/8" 1 ML Misc Use 1 Syringe monthly.   Testosterone 20.25 MG/1.25GM (1.62%) Gel Place 1 application onto the skin in the morning. Shoulders   ticagrelor 90 MG Tabs tablet Commonly known as: BRILINTA Take 1 tablet (90 mg total) by mouth 2 (two) times daily.   zolpidem 12.5 MG CR tablet Commonly known as: AMBIEN CR Take 12.5 mg by mouth at bedtime.      Plan is to add crestor 5 mg to help with statin therapy management    BRING ALL MEDICATIONS WITH YOU TO FOLLOW UP APPOINTMENTS  Time spent with patient to include physician time: 35 Signed:  Yolonda Kida MD, 04/21/2022, 12:12 PM

## 2022-04-21 NOTE — Plan of Care (Signed)

## 2022-04-23 ENCOUNTER — Encounter: Payer: Self-pay | Admitting: Internal Medicine

## 2022-04-24 LAB — LIPOPROTEIN A (LPA): Lipoprotein (a): 116.3 nmol/L — ABNORMAL HIGH (ref <75.0)

## 2022-05-21 ENCOUNTER — Encounter: Payer: Self-pay | Admitting: Surgery

## 2022-05-21 ENCOUNTER — Ambulatory Visit (INDEPENDENT_AMBULATORY_CARE_PROVIDER_SITE_OTHER): Payer: Medicare Other | Admitting: Surgery

## 2022-05-21 VITALS — BP 163/91 | HR 73 | Temp 98.7°F | Ht 69.0 in | Wt 173.0 lb

## 2022-05-21 DIAGNOSIS — K4020 Bilateral inguinal hernia, without obstruction or gangrene, not specified as recurrent: Secondary | ICD-10-CM

## 2022-05-21 DIAGNOSIS — I2510 Atherosclerotic heart disease of native coronary artery without angina pectoris: Secondary | ICD-10-CM

## 2022-05-21 DIAGNOSIS — Z9861 Coronary angioplasty status: Secondary | ICD-10-CM | POA: Diagnosis not present

## 2022-05-21 NOTE — Patient Instructions (Addendum)
Return in six months.  

## 2022-05-21 NOTE — Progress Notes (Signed)
05/21/2022  History of Present Illness: Jimmy Mcintyre is a 77 y.o. male s/p robotic assisted cholecystectomy with me on 08/04/20.  He presents today for evaluation of a left inguinal hernia.  On CT scan from 07/15/20, it showed that he had cholelithiasis, choledocholithiasis, and also bilateral inguinal hernias, with the left inguinal hernia being new at the time.  The patient reports that around Thanksgiving of 2023, he felt a tear/popping sensation in the left groin and had a lot of tenderness at the time.  Since then, he feels that the hernia is there and bulges, but denies any pain or burning sensation.  When he lies down, the hernia reduces and it bulges more with exertion.  Denies any issues in the right groin.  He also has a history of a penile implant, and has had three surgeries for this, as well as prostate cancer that required radiation therapy.    More recently, he had a cardiac catheterization on 04/20/22 which showed a mid LAD lesion that was 99% stenosed.  Balloon angioplasty was performed with a residual 25% stenosis, but no new stent was able to be placed.  He was placed on Brilinta and Aspirin, but given that Brilinta was too expensive, he was changed to Aspirin and Plavix.  However, at his pharmacy, he was informed that Plavix would interfere with his esophagus and he has not been taking it.  Past Medical History: Past Medical History:  Diagnosis Date   Abnormal prostate specific antigen 11/16/2013   Arteriosclerosis of coronary artery 10/29/2014   Overview:  pci stent of lad    Arthritis, degenerative 09/30/2013   Overview:   a.  Shoulders severe.   b.  Cervical spine.   c.  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   Diabetes mellitus type 2, uncontrolled 11/16/2013   Essential (primary) hypertension 10/29/2014   GERD (gastroesophageal reflux disease)    Glaucoma    H/O Mobitz type II  block    intermittent   OSA (obstructive sleep apnea) 04/20/2014   does not use nocturnal PAP therapy   SOB (shortness of breath) 09/30/2013   TI (tricuspid incompetence) 04/29/2014     Past Surgical History: Past Surgical History:  Procedure Laterality Date   APPENDECTOMY     CHOLECYSTECTOMY     COLONOSCOPY     CORONARY ANGIOPLASTY WITH STENT PLACEMENT  2001   CORONARY BALLOON ANGIOPLASTY N/A 04/20/2022   Procedure: CORONARY BALLOON ANGIOPLASTY;  Surgeon: Yolonda Kida, MD;  Location: [facility];  Service: Cardiovascular;  Laterality: N/A;   ERCP N/A 08/02/2020   Procedure: ENDOSCOPIC RETROGRADE CHOLANGIOPANCREATOGRAPHY (ERCP);  Surgeon: Lucilla Lame, MD;  Location: [facility];  Service: Endoscopy;  Laterality: N/A;   LEFT HEART CATH AND CORONARY ANGIOGRAPHY Left 04/20/2022   Procedure: LEFT HEART CATH AND CORONARY ANGIOGRAPHY;  Surgeon: Yolonda Kida, MD;  Location: [facility];  Service: Cardiovascular;  Laterality: Left;   PENILE PROSTHESIS IMPLANT  2000   REVERSE SHOULDER ARTHROPLASTY Left 01/14/2020   Procedure: REVERSE SHOULDER ARTHROPLASTY;  Surgeon: Justice Britain, MD;  Location: [facility];  Service: Orthopedics;  Laterality: Left;  175min   SHOULDER SURGERY Right 2014   TONSILLECTOMY     UPPER GI ENDOSCOPY      Home Medications: Prior to Admission medications   Medication Sig Start Date End Date Taking? Authorizing Provider  acetaminophen (TYLENOL) 500  MG tablet Take 1,000 mg by mouth every 6 (six) hours as needed for moderate pain.   Yes [provider]  allopurinol (ZYLOPRIM) 300 MG tablet Take 300 mg by mouth every evening. 11/07/17  Yes [provider]  amLODipine-benazepril (LOTREL) 5-20 MG per capsule Take 1 capsule by mouth in the morning. 11/08/13  Yes [provider]  aspirin 81 MG chewable tablet Chew 1 tablet (81 mg total) by mouth daily. 04/22/22 04/22/23 Yes Callwood, Dwayne D, MD  cyanocobalamin (,VITAMIN  B-12,) 1000 MCG/ML injection Inject 1,000 mcg into the muscle every 30 (thirty) days.  06/23/14  Yes [provider]  finasteride (PROSCAR) 5 MG tablet Take 5 mg by mouth in the morning. 06/23/14  Yes [provider]  gemfibrozil (LOPID) 600 MG tablet Take 600 mg by mouth 2 (two) times daily before a meal.   Yes [provider]  meloxicam (MOBIC) 15 MG tablet Take 15 mg by mouth in the morning. 07/27/20  Yes [provider]  omeprazole (PRILOSEC) 20 MG capsule Take 20 mg by mouth in the morning. 10/05/13  Yes [provider]  rosuvastatin (CRESTOR) 5 MG tablet Take 1 tablet (5 mg total) by mouth daily. 04/21/22  Yes Callwood, Dwayne D, MD  SYRINGE/NEEDLE, DISP, 1 ML 25G X 5/8" 1 ML MISC Use 1 Syringe monthly. 09/26/16  Yes [provider]  Testosterone 20.25 MG/1.25GM (1.62%) GEL Place 1 application onto the skin in the morning. Shoulders 06/11/20  Yes [provider]  ticagrelor (BRILINTA) 90 MG TABS tablet Take 1 tablet (90 mg total) by mouth 2 (two) times daily. 04/21/22 07/20/22 Yes Callwood, Dwayne D, MD  zolpidem (AMBIEN CR) 12.5 MG CR tablet Take 12.5 mg by mouth at bedtime.  04/27/14  Yes [provider]    Allergies: Allergies  Allergen Reactions   Bactrim [Sulfamethoxazole-Trimethoprim] Rash   Levofloxacin Rash    Review of Systems: Review of Systems  Constitutional:  Negative for chills and fever.  Respiratory:  Negative for shortness of breath.   Cardiovascular:  Negative for chest pain.  Gastrointestinal:  Negative for abdominal pain, nausea and vomiting.  Skin:  Negative for rash.    Physical Exam BP (!) 163/91   Pulse 73   Temp 98.7 F (37.1 C) (Oral)   Ht 5\' 9"  (1.753 m)   Wt 173 lb (78.5 kg)   BMI 25.55 kg/m  CONSTITUTIONAL: No acute distress, well nourished. HEENT:  Normocephalic, atraumatic, extraocular motion intact. RESPIRATORY:  Normal respiratory effort without pathologic use of accessory  muscles. CARDIOVASCULAR: Regular rhythm and rate. GI: The abdomen is soft, non-distended, non-tender to palpation.  The patient has a reducible left inguinal hernia, and a more subtle possible right inguinal hernia.  He has penile implant with pump palpable in the right scrotum area, with tubing going towards the pubic tubercle. NEUROLOGIC:  Motor and sensation is grossly normal.  Cranial nerves are grossly intact. PSYCH:  Alert and oriented to person, place and time. Affect is normal.  Labs/Imaging: CT renal study on 07/15/20: FINDINGS: Evaluation of this exam is limited in the absence of intravenous contrast.   Lower chest: Bibasilar linear atelectasis/scarring. The visualized lung bases are otherwise clear. Partially visualized small pericardial effusion measuring approximately 5 mm in thickness.   No intra-abdominal free air or free fluid.   Hepatobiliary: The liver is unremarkable. No intrahepatic biliary ductal dilatation. Several gallstones. No pericholecystic fluid or evidence of acute cholecystitis. There are multiple stones in the central CBD. The  common bile duct is dilated measuring up to 12 mm. MRCP may provide better evaluation of the gallbladder and bile duct.   Pancreas: Unremarkable. No pancreatic ductal dilatation or surrounding inflammatory changes.   Spleen: Normal in size without focal abnormality.   Adrenals/Urinary Tract: The adrenal glands unremarkable. There is no hydronephrosis or nephrolithiasis on either side. There is a 4 cm right renal posterior interpolar cyst. The visualized ureters appear unremarkable. The urinary bladder is decompressed around a Foley catheter.   Stomach/Bowel: There is sigmoid diverticulosis without active inflammatory changes. There is herniation of a short segment of sigmoid colon into the left inguinal canal, new since the prior CT. No evidence of obstruction or inflammation. There is no bowel obstruction. Appendectomy.    Vascular/Lymphatic: Mild aortoiliac atherosclerotic disease. The IVC is unremarkable. No portal venous gas. There is no adenopathy.   Reproductive: The prostate and seminal vesicles are grossly unremarkable. No pelvic mass. Penile implant with reservoir in the pelvis.   Other: Midline vertical anterior pelvic wall incisional scar. There is a fat containing right inguinal hernia as seen on the prior CT.   Musculoskeletal: Degenerative changes of the spine. No acute osseous pathology.   IMPRESSION: 1. No acute intra-abdominal or pelvic pathology. No hydronephrosis or nephrolithiasis. 2. Cholelithiasis and choledocholithiasis with mild dilatation of the CBD. MRCP may provide better evaluation of the gallbladder and bile duct. 3. Sigmoid diverticulosis. No bowel obstruction or inflammation. 4. Left inguinal hernia containing a short segment of sigmoid colon, new since the prior CT. 5. Aortic Atherosclerosis (ICD10-I70.0).  Assessment and Plan: This is a 77 y.o. male with bilateral inguinal hernias.  --Discussed with the patient the findings on his CT scan and on exam.  CT scan shows bilateral inguinal hernias, and on exam, his right inguinal hernia is more subtle, with the left side being more clear on exam.  Discussed that given the multiple surgeries for penile implant as well as radiation therapy for his prostate cancer, minimally invasive or robotic surgery would be more difficult, and as such, he likely would need open repair instead.  He is currently asymptomatic from his hernias and there is no urgent need for surgery. --The main obstacle to doing surgery anytime soon is his heart.  He has a cardiac catheterization last month for a 95% stenosis of LAD stent.  He was placed on Brilinta and Aspirin, but due to cost of Brilinta, he was changed to Plavix.  However, he's not taking his Plavix as he was told it would interfere with his esophagus.  Currently he is only on Aspirin.  I  recommended that he contact his cardiologist so he can be evaluated.  He is not too happy with his current team and after discussing further, we will send referral to [facility] cardiology group. --Follow up in 6 months to re-evaluate how he's doing from the cardiac standpoint and if it's feasible to do surgery.  Recommended avoiding strenuous activity.  I spent 30 minutes dedicated to the care of this patient on the date of this encounter to include pre-visit review of records, face-to-face time with the patient discussing diagnosis and management, and any post-visit coordination of care.   Melvyn Neth, [facility]

## 2022-07-12 ENCOUNTER — Encounter: Payer: Self-pay | Admitting: Cardiology

## 2022-07-12 ENCOUNTER — Ambulatory Visit: Payer: Medicare Other | Attending: Cardiology | Admitting: Cardiology

## 2022-07-12 VITALS — BP 136/76 | HR 54 | Ht 69.0 in | Wt 171.6 lb

## 2022-07-12 DIAGNOSIS — I2510 Atherosclerotic heart disease of native coronary artery without angina pectoris: Secondary | ICD-10-CM | POA: Diagnosis not present

## 2022-07-12 DIAGNOSIS — I10 Essential (primary) hypertension: Secondary | ICD-10-CM

## 2022-07-12 DIAGNOSIS — E785 Hyperlipidemia, unspecified: Secondary | ICD-10-CM | POA: Diagnosis not present

## 2022-07-12 DIAGNOSIS — I441 Atrioventricular block, second degree: Secondary | ICD-10-CM

## 2022-07-12 MED ORDER — ASPIRIN 81 MG PO TBEC: 81.0000 mg | DELAYED_RELEASE_TABLET | Freq: Every day | ORAL | 3 refills | Status: AC

## 2022-07-12 MED ORDER — EZETIMIBE 10 MG PO TABS: 10.0000 mg | ORAL_TABLET | Freq: Every day | ORAL | 3 refills | Status: DC

## 2022-07-12 MED ORDER — CLOPIDOGREL BISULFATE 75 MG PO TABS: 75.0000 mg | ORAL_TABLET | Freq: Every day | ORAL | 3 refills | Status: DC

## 2022-07-12 NOTE — Patient Instructions (Signed)
Medication Instructions:   START Aspirin 81 - take one tablet ( 81mg ) by mouth daily.  START Plavix - Take one tablet ( 75mg ) by mouth daily.  START Zetia - take one tablet ( 10mg ) by mouth daily.   *If you need a refill on your cardiac medications before your next appointment, please call your pharmacy*   Lab Work:  None Ordered  If you have labs (blood work) drawn today and your tests are completely normal, you will receive your results only by: MyChart Message (if you have MyChart) OR A paper copy in the mail If you have any lab test that is abnormal or we need to change your treatment, we will call you to review the results.   Testing/Procedures:  Your physician has recommended that you wear a Zio monitor.   This monitor is a medical device that records the heart's electrical activity. Doctors most often use these monitors to diagnose arrhythmias. Arrhythmias are problems with the speed or rhythm of the heartbeat. The monitor is a small device applied to your chest. You can wear one while you do your normal daily activities. While wearing this monitor if you have any symptoms to push the button and record what you felt. Once you have worn this monitor for the period of time provider prescribed (Usually 14 days), you will return the monitor device in the postage paid box. Once it is returned they will download the data collected and provide [place] with a report which the provider will then review and we will call you with those results. Important tips:  Avoid showering during the first 24 hours of wearing the monitor. Avoid excessive sweating to help maximize wear time. Do not submerge the device, no hot tubs, and no swimming pools. Keep any lotions or oils away from the patch. After 24 hours you may shower with the patch on. Take brief showers with your back facing the shower head.  Do not remove patch once it has been placed because that will interrupt data and decrease adhesive wear  time. Push the button when you have any symptoms and write down what you were feeling. Once you have completed wearing your monitor, remove and place into box which has postage paid and place in your outgoing mailbox.  If for some reason you have misplaced your box then call our office and we can provide another box and/or mail it off for you.      Follow-Up: At [facility], you and your health needs are our priority.  As part of our continuing mission to provide you with exceptional heart care, we have created designated Provider Care Teams.  These Care Teams include your primary Cardiologist (physician) and Advanced Practice Providers (APPs -  Physician Assistants and Nurse Practitioners) who all work together to provide you with the care you need, when you need it.  We recommend signing up for the patient portal called "MyChart".  Sign up information is provided on this After Visit Summary.  MyChart is used to connect with patients for Virtual Visits (Telemedicine).  Patients are able to view lab/test results, encounter notes, upcoming appointments, etc.  Non-urgent messages can be sent to your provider as well.   To learn more about what you can do with MyChart, go to [email or web address].    Your next appointment:   6 week(s)  Provider:   Debbe Odea, MD ONLY

## 2022-07-12 NOTE — Progress Notes (Signed)
Cardiology Office Note:    Date:  07/12/2022   ID:  Jimmy Mcintyre, DOB [birth year], MRN [id number]  PCP:  Marguarite Arbour, MD   [facility] Providers Cardiologist:  Debbe Odea, MD     Referring MD: Marguarite Arbour, MD   Chief Complaint  Patient presents with   New Patient (Initial Visit)    Establish cardiac care from [facility].  Patient had Cath on 04/20/22 results available in chart.  Having daily episodes of feeling "heart working to hard" that last about an hour.  Was prescribed Brilinta but unable to afford.  Then was prescribed Plavix bu was informed by pharmacy he can't take Plavix due to taking Pantoprazole so he is taking ASA 325 mg QD.    History of Present Illness:    Jimmy Mcintyre is a 77 y.o. male with a hx of CAD (s/p PCI mid LAD 1990's in [place], PBA mid LAD stent 2/24- 25% residual stenosis), intermittent Mobitz 2, hypertension, hyperlipidemia who presents to establish care.  Previously seen by [facility] cardiology from a cardiac perspective.  He underwent a stress test/exercise MPI 04/04/2022 due to symptoms of angina, with ETT suggestive of ischemia, perfusion showed apical defect.  Echocardiogram 03/2022 normal EF>55% Recent left heart cath 04/2022 99% mid LAD in-stent stenosis, underwent balloon angioplasty with 25% residual stenosis.  Failure to deploy stent due to being unable to cross lesion.  Staged intervention recommended if patient develops symptoms.  He has occasional dizziness, attributes this to low blood sugars.  Endorses occasional chest pressure.   Past Medical History:  Diagnosis Date   Abnormal prostate specific antigen 11/16/2013   Arteriosclerosis of coronary artery 10/29/2014   Overview:  pci stent of lad    Arthritis, degenerative 09/30/2013   Overview:   a.  Shoulders severe.   b.  Cervical spine.   c.  Lumbar spine    Barrett's esophagus without dysplasia    Benign essential HTN 10/11/2014   Bradycardia    Cancer (HCC)     PROSTATE    Combined fat and carbohydrate induced hyperlipemia 10/29/2014   Coronary artery disease    Degeneration of lumbar or lumbosacral intervertebral disc    Diabetes mellitus type 2, uncontrolled 11/16/2013   Elevated PSA    Essential (primary) hypertension 10/29/2014   GERD (gastroesophageal reflux disease)    Glaucoma    H/O Mobitz type II block    intermittent   Hypertension    Hypotestosteronemia    Leukocytosis    OSA (obstructive sleep apnea) 04/20/2014   does not use nocturnal PAP therapy   Osteoarthritis    SOB (shortness of breath) 09/30/2013   TI (tricuspid incompetence) 04/29/2014    Past Surgical History:  Procedure Laterality Date   APPENDECTOMY     CHOLECYSTECTOMY     COLONOSCOPY     CORONARY ANGIOPLASTY WITH STENT PLACEMENT  2001   CORONARY BALLOON ANGIOPLASTY N/A 04/20/2022   Procedure: CORONARY BALLOON ANGIOPLASTY;  Surgeon: Alwyn Pea, MD;  Location: [facility] INVASIVE CV LAB;  Service: Cardiovascular;  Laterality: N/A;   ERCP N/A 08/02/2020   Procedure: ENDOSCOPIC RETROGRADE CHOLANGIOPANCREATOGRAPHY (ERCP);  Surgeon: Midge Minium, MD;  Location: [facility] ENDOSCOPY;  Service: Endoscopy;  Laterality: N/A;   LEFT HEART CATH AND CORONARY ANGIOGRAPHY Left 04/20/2022   Procedure: LEFT HEART CATH AND CORONARY ANGIOGRAPHY;  Surgeon: Alwyn Pea, MD;  Location: [facility] INVASIVE CV LAB;  Service: Cardiovascular;  Laterality: Left;   PENILE PROSTHESIS IMPLANT  2000   REVERSE  SHOULDER ARTHROPLASTY Left 01/14/2020   Procedure: REVERSE SHOULDER ARTHROPLASTY;  Surgeon: Francena Hanly, MD;  Location: [facility] ORS;  Service: Orthopedics;  Laterality: Left;    SHOULDER SURGERY Right 2014   TONSILLECTOMY     UPPER GI ENDOSCOPY      Current Medications: Current Meds  Medication Sig   acetaminophen (TYLENOL) 500 MG tablet Take 1,000 mg by mouth every 6 (six) hours as needed for moderate pain.   allopurinol (ZYLOPRIM) 300 MG tablet Take 300 mg by mouth every evening.    amLODipine-benazepril (LOTREL) 5-20 MG per capsule Take 1 capsule by mouth in the morning.   aspirin EC 81 MG tablet Take 1 tablet (81 mg total) by mouth daily. Swallow whole.   clopidogrel (PLAVIX) 75 MG tablet Take 1 tablet (75 mg total) by mouth daily.   cyanocobalamin (,VITAMIN B-12,) 1000 MCG/ML injection Inject 1,000 mcg into the muscle every 30 (thirty) days.    ezetimibe (ZETIA) 10 MG tablet Take 1 tablet (10 mg total) by mouth daily.   finasteride (PROSCAR) 5 MG tablet Take 5 mg by mouth in the morning.   gemfibrozil (LOPID) 600 MG tablet Take 600 mg by mouth 2 (two) times daily before a meal.   glimepiride (AMARYL) 4 MG tablet Take 1 tablet by mouth daily with breakfast.   meloxicam (MOBIC) 15 MG tablet Take 15 mg by mouth in the morning.   pioglitazone (ACTOS) 30 MG tablet Take 30 mg by mouth daily.   rosuvastatin (CRESTOR) 5 MG tablet Take 1 tablet (5 mg total) by mouth daily.   SYRINGE/NEEDLE, DISP, 1 ML 25G X 5/8" 1 ML MISC Use 1 Syringe monthly.   Testosterone 20.25 MG/1.25GM (1.62%) GEL Place 1 application onto the skin in the morning. Shoulders   zolpidem (AMBIEN CR) 12.5 MG CR tablet Take 12.5 mg by mouth at bedtime.    [DISCONTINUED] aspirin 325 MG tablet Take 325 mg by mouth daily.     Allergies:   Bactrim [sulfamethoxazole-trimethoprim] and Levofloxacin   Social History   Socioeconomic History   Marital status: Married    Spouse name: Not on file   Number of children: Not on file   Years of education: Not on file   Highest education level: Not on file  Occupational History   Occupation: retired  Tobacco Use   Smoking status: Never   Smokeless tobacco: Never  Vaping Use   Vaping Use: Never used  Substance and Sexual Activity   Alcohol use: Yes    Alcohol/week: 7.0 standard drinks of alcohol    Types: 7 Shots of liquor per week    Comment: drink daily bourban   Drug use: No   Sexual activity: Not on file  Other Topics Concern   Not on file  Social  History Narrative   Not on file   Social Determinants of Health   Financial Resource Strain: Not on file  Food Insecurity: No Food Insecurity (04/21/2022)   Hunger Vital Sign    Worried About Running Out of Food in the Last Year: Never true    Ran Out of Food in the Last Year: Never true  Transportation Needs: No Transportation Needs (04/21/2022)   PRAPARE - Administrator, Civil Service (Medical): No    Lack of Transportation (Non-Medical): No  Physical Activity: Not on file  Stress: Not on file  Social Connections: Not on file     Family History: The patient's family history includes CAD in his father and mother;  Diabetes Mellitus II in his mother. There is no history of Prostate cancer or Bladder Cancer.  ROS:   Please see the history of present illness.     All other systems reviewed and are negative.  EKGs/Labs/Other Studies Reviewed:    The following studies were reviewed today:   EKG:  EKG is  ordered today.  The ekg ordered today demonstrates sinus bradycardia, Mobitz 2 AV block.  Recent Labs: 04/05/2022: B Natriuretic Peptide 34.6 04/21/2022: BUN 20; Creatinine, Ser 0.95; Hemoglobin 13.1; Platelets 224; Potassium 3.9; Sodium 139  Recent Lipid Panel    Component Value Date/Time   CHOL 151 09/07/2016 0435   TRIG 159 (H) 09/07/2016 0435   HDL 38 (L) 09/07/2016 0435   CHOLHDL 4.0 09/07/2016 0435   VLDL 32 09/07/2016 0435   LDLCALC 81 09/07/2016 0435   Lipid panel [facility] 05/15/2022 total cholesterol 167, triglycerides 118, LDL 87, HDL 56.  Risk Assessment/Calculations:             Physical Exam:    VS:  BP 136/76 (BP Location: Left Arm, Patient Position: Sitting, Cuff Size: Normal)   Pulse (!) 54   Ht 5\' 9"  (1.753 m)   Wt 171 lb 9.6 oz (77.8 kg)   SpO2 97%   BMI 25.34 kg/m     Wt Readings from Last 3 Encounters:  07/12/22 171 lb 9.6 oz (77.8 kg)  05/21/22 173 lb (78.5 kg)  04/20/22 173 lb (78.5 kg)     GEN:  Well nourished, well  developed in no acute distress HEENT: Normal NECK: No JVD; No carotid bruits CARDIAC: RRR, no murmurs, rubs, gallops RESPIRATORY:  Clear to auscultation without rales, wheezing or rhonchi  ABDOMEN: Soft, non-tender, non-distended MUSCULOSKELETAL:  No edema; No deformity  SKIN: Warm and dry NEUROLOGIC:  Alert and oriented x 3 PSYCHIATRIC:  Normal affect   ASSESSMENT:    1. Coronary artery disease involving native coronary artery of native heart without angina pectoris   2. AV block, Mobitz 2   3. Primary hypertension   4. Hyperlipidemia LDL goal <70    PLAN:    In order of problems listed above:  CAD, mid LAD in-stent stenosis s/p POBA 04/2022 with 25% residual stenosis.  Occasional chest pain.  Restart Imdur 30 mg daily.  Aspirin 81, Plavix 75.  Zetia 10 mg daily.  Last EF normal over 55%.  Will review left heart cath films with interventional cardiology to see if repeat left heart cath with possible intervention will benefit patient. Mobitz 2 AV block, occasional dizziness which patient attributes to hypoglycemia.  Place cardiac monitor, refer to EP.  Avoid AV nodal agents. Hypertension, BP controlled.  Amlodipine 5, benazepril 20. Hyperlipidemia, not tolerant to statins.  Start Zetia 10 mg daily.  Follow-up in 6 weeks.  Medication Adjustments/Labs and Tests Ordered: Current medicines are reviewed at length with the patient today.  Concerns regarding medicines are outlined above.  Orders Placed This Encounter  Procedures   EKG 12-Lead   Meds ordered this encounter  Medications   aspirin EC 81 MG tablet    Sig: Take 1 tablet (81 mg total) by mouth daily. Swallow whole.    Dispense:  90 tablet    Refill:  3   clopidogrel (PLAVIX) 75 MG tablet    Sig: Take 1 tablet (75 mg total) by mouth daily.    Dispense:  90 tablet    Refill:  3   ezetimibe (ZETIA) 10 MG tablet    Sig:  Take 1 tablet (10 mg total) by mouth daily.    Dispense:  90 tablet    Refill:  3    Patient  Instructions  Medication Instructions:   START Aspirin 81 - take one tablet ( 81mg ) by mouth daily.  START Plavix - Take one tablet ( 75mg ) by mouth daily.  START Zetia - take one tablet ( 10mg ) by mouth daily.   *If you need a refill on your cardiac medications before your next appointment, please call your pharmacy*   Lab Work:  None Ordered  If you have labs (blood work) drawn today and your tests are completely normal, you will receive your results only by: MyChart Message (if you have MyChart) OR A paper copy in the mail If you have any lab test that is abnormal or we need to change your treatment, we will call you to review the results.   Testing/Procedures:  Your physician has recommended that you wear a Zio monitor.   This monitor is a medical device that records the heart's electrical activity. Doctors most often use these monitors to diagnose arrhythmias. Arrhythmias are problems with the speed or rhythm of the heartbeat. The monitor is a small device applied to your chest. You can wear one while you do your normal daily activities. While wearing this monitor if you have any symptoms to push the button and record what you felt. Once you have worn this monitor for the period of time provider prescribed (Usually 14 days), you will return the monitor device in the postage paid box. Once it is returned they will download the data collected and provide [place] with a report which the provider will then review and we will call you with those results. Important tips:  Avoid showering during the first 24 hours of wearing the monitor. Avoid excessive sweating to help maximize wear time. Do not submerge the device, no hot tubs, and no swimming pools. Keep any lotions or oils away from the patch. After 24 hours you may shower with the patch on. Take brief showers with your back facing the shower head.  Do not remove patch once it has been placed because that will interrupt data and decrease  adhesive wear time. Push the button when you have any symptoms and write down what you were feeling. Once you have completed wearing your monitor, remove and place into box which has postage paid and place in your outgoing mailbox.  If for some reason you have misplaced your box then call our office and we can provide another box and/or mail it off for you.      Follow-Up: At [facility], you and your health needs are our priority.  As part of our continuing mission to provide you with exceptional heart care, we have created designated Provider Care Teams.  These Care Teams include your primary Cardiologist (physician) and Advanced Practice Providers (APPs -  Physician Assistants and Nurse Practitioners) who all work together to provide you with the care you need, when you need it.  We recommend signing up for the patient portal called "MyChart".  Sign up information is provided on this After Visit Summary.  MyChart is used to connect with patients for Virtual Visits (Telemedicine).  Patients are able to view lab/test results, encounter notes, upcoming appointments, etc.  Non-urgent messages can be sent to your provider as well.   To learn more about what you can do with MyChart, go to [email or web address].    Your next appointment:  6 week(s)  Provider:   Debbe Odea, MD ONLY       Signed, Debbe Odea, MD  07/12/2022 12:42 PM    [facility]

## 2022-07-24 ENCOUNTER — Telehealth: Payer: Self-pay | Admitting: Cardiology

## 2022-07-24 ENCOUNTER — Ambulatory Visit: Payer: Medicare Other | Attending: Cardiology

## 2022-07-24 ENCOUNTER — Other Ambulatory Visit: Payer: Self-pay

## 2022-07-24 DIAGNOSIS — I2510 Atherosclerotic heart disease of native coronary artery without angina pectoris: Secondary | ICD-10-CM

## 2022-07-24 DIAGNOSIS — I441 Atrioventricular block, second degree: Secondary | ICD-10-CM

## 2022-07-24 NOTE — Telephone Encounter (Signed)
Patient called stating he was suppose to receive a heart monitor and he still hasn't received it.  He also states he has some questions about about it.  He was in the office on 07/12/22.

## 2022-07-24 NOTE — Telephone Encounter (Signed)
Spoke with patient.  Made patient aware that we will reorder the monitor and he should have it by late this week.   Reviewed instructions regarding the monitor.   Patient was thankful for the return call.

## 2022-07-26 DIAGNOSIS — I2510 Atherosclerotic heart disease of native coronary artery without angina pectoris: Secondary | ICD-10-CM | POA: Diagnosis not present

## 2022-07-26 DIAGNOSIS — I441 Atrioventricular block, second degree: Secondary | ICD-10-CM

## 2022-08-31 ENCOUNTER — Ambulatory Visit: Payer: Medicare Other | Attending: Cardiology | Admitting: Cardiology

## 2022-08-31 ENCOUNTER — Encounter: Payer: Self-pay | Admitting: Cardiology

## 2022-08-31 VITALS — BP 124/60 | HR 65 | Ht 69.0 in | Wt 173.4 lb

## 2022-08-31 DIAGNOSIS — I441 Atrioventricular block, second degree: Secondary | ICD-10-CM | POA: Diagnosis not present

## 2022-08-31 DIAGNOSIS — E785 Hyperlipidemia, unspecified: Secondary | ICD-10-CM

## 2022-08-31 DIAGNOSIS — I10 Essential (primary) hypertension: Secondary | ICD-10-CM | POA: Diagnosis not present

## 2022-08-31 DIAGNOSIS — Z01810 Encounter for preprocedural cardiovascular examination: Secondary | ICD-10-CM

## 2022-08-31 DIAGNOSIS — I2510 Atherosclerotic heart disease of native coronary artery without angina pectoris: Secondary | ICD-10-CM

## 2022-08-31 NOTE — Progress Notes (Signed)
Cardiology Office Note:    Date:  08/31/2022   ID:  Jimmy Mcintyre, DOB 12-14-1945, MRN [id number]  PCP:  Marguarite Arbour, MD   [facility] Providers Cardiologist:  Debbe Odea, MD     Referring MD: Marguarite Arbour, MD   Chief Complaint  Patient presents with   Follow-up    Discuss 08/16/22 Zio results.  Patient denies new or acute cardiac problems/concerns today.  Not been seen or scheduled with EP clinic yet.    History of Present Illness:    Jimmy Mcintyre is a 77 y.o. male with a hx of CAD (s/p PCI mid LAD 1990's in [place], PBA mid LAD stent 2/24- 25% residual stenosis), intermittent Mobitz 2 AV block, hypertension, hyperlipidemia who presents for follow-up.    Previously seen due to symptoms of chest pain.  Plavix and Zetia was started.  Cardiac monitor all placed to ascertain degree of Mobitz AV block.  He has inguinal hernia bilaterally, surgical intervention being planned in the near future.  He denies chest pain or shortness of breath.  Denies dizziness, presyncope or syncope.    Prior notes Echocardiogram 03/2022 normal EF>55% Recent left heart cath 04/2022 99% mid LAD in-stent stenosis, underwent balloon angioplasty with 25% residual stenosis.  Failure to deploy stent due to being unable to cross lesion.  Staged intervention recommended if patient develops symptoms.   Past Medical History:  Diagnosis Date   Abnormal prostate specific antigen 11/16/2013   Arteriosclerosis of coronary artery 10/29/2014   Overview:  pci stent of lad    Arthritis, degenerative 09/30/2013   Overview:   a.  Shoulders severe.   b.  Cervical spine.   c.  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   Coronary artery disease    Degeneration of lumbar or lumbosacral intervertebral disc    Diabetes mellitus type 2, uncontrolled  11/16/2013   Elevated PSA    Essential (primary) hypertension 10/29/2014   GERD (gastroesophageal reflux disease)    Glaucoma    H/O Mobitz type II block    intermittent   Hypertension    Hypotestosteronemia    Leukocytosis    OSA (obstructive sleep apnea) 04/20/2014   does not use nocturnal PAP therapy   Osteoarthritis    SOB (shortness of breath) 09/30/2013   TI (tricuspid incompetence) 04/29/2014    Past Surgical History:  Procedure Laterality Date   APPENDECTOMY     CHOLECYSTECTOMY     COLONOSCOPY     CORONARY ANGIOPLASTY WITH STENT PLACEMENT  2001   CORONARY BALLOON ANGIOPLASTY N/A 04/20/2022   Procedure: CORONARY BALLOON ANGIOPLASTY;  Surgeon: Alwyn Pea, MD;  Location: [facility] INVASIVE CV LAB;  Service: Cardiovascular;  Laterality: N/A;   ERCP N/A 08/02/2020   Procedure: ENDOSCOPIC RETROGRADE CHOLANGIOPANCREATOGRAPHY (ERCP);  Surgeon: Midge Minium, MD;  Location: [facility] ENDOSCOPY;  Service: Endoscopy;  Laterality: N/A;   LEFT HEART CATH AND CORONARY ANGIOGRAPHY Left 04/20/2022   Procedure: LEFT HEART CATH AND CORONARY ANGIOGRAPHY;  Surgeon: Alwyn Pea, MD;  Location: [facility] INVASIVE CV LAB;  Service: Cardiovascular;  Laterality: Left;   PENILE PROSTHESIS IMPLANT  2000   REVERSE SHOULDER ARTHROPLASTY Left 01/14/2020   Procedure: REVERSE SHOULDER ARTHROPLASTY;  Surgeon: Francena Hanly, MD;  Location: [facility] ORS;  Service: Orthopedics;  Laterality: Left;    SHOULDER  SURGERY Right 2014   TONSILLECTOMY     UPPER GI ENDOSCOPY      Current Medications: Current Meds  Medication Sig   acetaminophen (TYLENOL) 500 MG tablet Take 1,000 mg by mouth every 6 (six) hours as needed for moderate pain.   allopurinol (ZYLOPRIM) 300 MG tablet Take 300 mg by mouth every evening.   amLODipine-benazepril (LOTREL) 5-20 MG per capsule Take 1 capsule by mouth in the morning.   aspirin EC 81 MG tablet Take 1 tablet (81 mg total) by mouth daily. Swallow whole.   clopidogrel (PLAVIX) 75  MG tablet Take 1 tablet (75 mg total) by mouth daily.   cyanocobalamin (,VITAMIN B-12,) 1000 MCG/ML injection Inject 1,000 mcg into the muscle every 30 (thirty) days.    ezetimibe (ZETIA) 10 MG tablet Take 1 tablet (10 mg total) by mouth daily.   finasteride (PROSCAR) 5 MG tablet Take 5 mg by mouth in the morning.   gemfibrozil (LOPID) 600 MG tablet Take 600 mg by mouth 2 (two) times daily before a meal.   glimepiride (AMARYL) 4 MG tablet Take 1 tablet by mouth daily with breakfast.   meloxicam (MOBIC) 15 MG tablet Take 15 mg by mouth in the morning.   pioglitazone (ACTOS) 30 MG tablet Take 30 mg by mouth daily.   rosuvastatin (CRESTOR) 5 MG tablet Take 1 tablet (5 mg total) by mouth daily.   SYRINGE/NEEDLE, DISP, 1 ML 25G X 5/8" 1 ML MISC Use 1 Syringe monthly.   Testosterone 20.25 MG/1.25GM (1.62%) GEL Place 1 application onto the skin in the morning. Shoulders   zolpidem (AMBIEN CR) 12.5 MG CR tablet Take 12.5 mg by mouth at bedtime.      Allergies:   Bactrim [sulfamethoxazole-trimethoprim] and Levofloxacin   Social History   Socioeconomic History   Marital status: Married    Spouse name: Not on file   Number of children: Not on file   Years of education: Not on file   Highest education level: Not on file  Occupational History   Occupation: retired  Tobacco Use   Smoking status: Never   Smokeless tobacco: Never  Vaping Use   Vaping Use: Never used  Substance and Sexual Activity   Alcohol use: Yes    Alcohol/week: 7.0 standard drinks of alcohol    Types: 7 Shots of liquor per week    Comment: drink daily bourban   Drug use: No   Sexual activity: Not on file  Other Topics Concern   Not on file  Social History Narrative   Not on file   Social Determinants of Health   Financial Resource Strain: Not on file  Food Insecurity: No Food Insecurity (04/21/2022)   Hunger Vital Sign    Worried About Running Out of Food in the Last Year: Never true    Ran Out of Food in the Last  Year: Never true  Transportation Needs: No Transportation Needs (04/21/2022)   PRAPARE - Administrator, Civil Service (Medical): No    Lack of Transportation (Non-Medical): No  Physical Activity: Not on file  Stress: Not on file  Social Connections: Not on file     Family History: The patient's family history includes CAD in his father and mother; Diabetes Mellitus II in his mother. There is no history of Prostate cancer or Bladder Cancer.  ROS:   Please see the history of present illness.     All other systems reviewed and are negative.  EKGs/Labs/Other Studies Reviewed:  The following studies were reviewed today:   EKG:  EKG not ordered today.   Recent Labs: 04/05/2022: B Natriuretic Peptide 34.6 04/21/2022: BUN 20; Creatinine, Ser 0.95; Hemoglobin 13.1; Platelets 224; Potassium 3.9; Sodium 139  Recent Lipid Panel    Component Value Date/Time   CHOL 151 09/07/2016 0435   TRIG 159 (H) 09/07/2016 0435   HDL 38 (L) 09/07/2016 0435   CHOLHDL 4.0 09/07/2016 0435   VLDL 32 09/07/2016 0435   LDLCALC 81 09/07/2016 0435   Lipid panel [facility] 05/15/2022 total cholesterol 167, triglycerides 118, LDL 87, HDL 56.  Risk Assessment/Calculations:             Physical Exam:    VS:  BP 124/60 (BP Location: Left Arm, Patient Position: Sitting, Cuff Size: Large)   Pulse 65   Ht 5\' 9"  (1.753 m)   Wt 173 lb 6.4 oz (78.7 kg)   SpO2 97%   BMI 25.61 kg/m     Wt Readings from Last 3 Encounters:  08/31/22 173 lb 6.4 oz (78.7 kg)  07/12/22 171 lb 9.6 oz (77.8 kg)  05/21/22 173 lb (78.5 kg)     GEN:  Well nourished, well developed in no acute distress HEENT: Normal NECK: No JVD; No carotid bruits CARDIAC: RRR, no murmurs, rubs, gallops RESPIRATORY:  Clear to auscultation without rales, wheezing or rhonchi  ABDOMEN: Soft, non-tender, non-distended MUSCULOSKELETAL:  No edema; No deformity  SKIN: Warm and dry NEUROLOGIC:  Alert and oriented x 3 PSYCHIATRIC:   Normal affect   ASSESSMENT:    1. Coronary artery disease involving native coronary artery of native heart without angina pectoris   2. AV block, Mobitz 2   3. Primary hypertension   4. Hyperlipidemia LDL goal <70   5. Preoperative cardiovascular examination     PLAN:    In order of problems listed above:  CAD PCI mid LAD in 1990s, mid LAD in-stent stenosis s/p POBA 04/2022 with 25% residual stenosis.  Denies chest pain, continue aspirin 81, Plavix 75, Zetia 10 mg daily.  Last EF normal over 55%.  Currently asymptomatic.  If he develops symptoms in the future, we will plan left heart cath with possible drug-coated balloon dilatation.  Discussed with IC. Mobitz 2 AV block, occasional Mobitz 1.  Refer to EP.  Avoid AV nodal agents. Hypertension, BP controlled.  Amlodipine 5, benazepril 20. Hyperlipidemia, not tolerant to statins.  Start Zetia 10 mg daily. Left inguinal hernia procedure being planned in the near future.  Patient has chronic stable cardiac disease.  EP evaluation pending, if pacemaker not indicated, okay to proceed with inguinal surgery from a cardiac perspective.  Last EF was normal, asymptomatic.  Follow-up in 3 months.  Medication Adjustments/Labs and Tests Ordered: Current medicines are reviewed at length with the patient today.  Concerns regarding medicines are outlined above.  Orders Placed This Encounter  Procedures   Ambulatory referral to Cardiac Electrophysiology   No orders of the defined types were placed in this encounter.   Patient Instructions  Medication Instructions:  Your physician recommends that you continue on your current medications as directed. Please refer to the Current Medication list given to you today.  *If you need a refill on your cardiac medications before your next appointment, please call your pharmacy*  Lab Work: -None ordered  Testing/Procedures: -None ordered  Follow-Up: At [facility], you and your health needs  are our priority.  As part of our continuing mission to provide you with exceptional  heart care, we have created designated Provider Care Teams.  These Care Teams include your primary Cardiologist (physician) and Advanced Practice Providers (APPs -  Physician Assistants and Nurse Practitioners) who all work together to provide you with the care you need, when you need it.   Your next appointment:   3 month(s)  Provider:   Debbe Odea, MD    Other Instructions -Ambulatory referral to Cardiac Electrophysiology   Signed, Debbe Odea, MD  08/31/2022 2:29 PM    [facility]

## 2022-08-31 NOTE — Patient Instructions (Signed)
Medication Instructions:  Your physician recommends that you continue on your current medications as directed. Please refer to the Current Medication list given to you today.  *If you need a refill on your cardiac medications before your next appointment, please call your pharmacy*  Lab Work: -None ordered  Testing/Procedures: -None ordered  Follow-Up: At [facility], you and your health needs are our priority.  As part of our continuing mission to provide you with exceptional heart care, we have created designated Provider Care Teams.  These Care Teams include your primary Cardiologist (physician) and Advanced Practice Providers (APPs -  Physician Assistants and Nurse Practitioners) who all work together to provide you with the care you need, when you need it.   Your next appointment:   3 month(s)  Provider:   Debbe Odea, MD    Other Instructions -Ambulatory referral to Cardiac Electrophysiology

## 2022-10-10 ENCOUNTER — Ambulatory Visit: Payer: Medicare Other | Admitting: Cardiology

## 2022-11-14 ENCOUNTER — Ambulatory Visit: Payer: Medicare Other | Admitting: Surgery

## 2022-11-19 ENCOUNTER — Ambulatory Visit: Payer: Medicare Other | Admitting: Cardiology

## 2022-11-26 NOTE — Progress Notes (Unsigned)
Electrophysiology Office Note:    Date:  11/27/2022   ID:  Jimmy Mcintyre, DOB 27-Feb-1946, MRN [id number]  [facility] Cardiologist:  Debbe Odea, MD  [facility] Electrophysiologist:  Lanier Prude, MD   Referring MD: Debbe Odea, MD   Chief Complaint: AV block  History of Present Illness:    Jimmy Mcintyre is a 77 y.o. malewho I am seeing today for an evaluation of AV block at the request of Dr. Azucena Cecil.  The patient was last seen by Dr. Azucena Cecil on August 31, 2022.  The patient has a medical history that includes coronary artery disease with prior PCI, Mobitz 2 AV block (intermittent), hypertension, hyperlipidemia..  The patient has an upcoming left inguinal hernia repair and EP consultation was requested prior to the procedure given his AV conduction disease.  Today he is doing well.  No syncopal history.        Their past medical, social and family history was reveiwed.   ROS:   Please see the history of present illness.    All other systems reviewed and are negative.  EKGs/Labs/Other Studies Reviewed:    The following studies were reviewed today:  August 16, 2022 ZIO monitor personally reviewed Intermittent Mobitz 1 AV block.  Blocked PACs.      EKG Interpretation Date/Time:  Tuesday November 27 2022 13:22:12 EDT Ventricular Rate:  79 PR Interval:  160 QRS Duration:  72 QT Interval:  364 QTC Calculation: 417 R Axis:   75  Text Interpretation: Sinus rhythm with Premature atrial complexes Confirmed by Steffanie Dunn [phone]) on 11/27/2022 1:26:35 PM  07/12/2022 ECG Sinus. PR . QRS duration 84ms. Blocked PAC's.         Physical Exam:    VS:  BP 136/64   Pulse 79   Ht 5\' 9"  (1.753 m)   Wt 169 lb (76.7 kg)   SpO2 97%   BMI 24.96 kg/m     Wt Readings from Last 3 Encounters:  11/27/22 169 lb (76.7 kg)  08/31/22 173 lb 6.4 oz (78.7 kg)  07/12/22 171 lb 9.6 oz (77.8 kg)     GEN:  Well nourished, well developed in  no acute distress CARDIAC: RRR, no murmurs, rubs, gallops RESPIRATORY:  Clear to auscultation without rales, wheezing or rhonchi       ASSESSMENT AND PLAN:    1. Primary hypertension   2. Mobitz (type) I (Wenckebach's) atrioventricular block     #Intermittent Mobitz I AV block #Blocked PAC's Patient with intermittent Mobitz I AV block. There are also ECG's and rhythm strips showing blocked PACs and not Mobitz II or higher degree AV block. He is asymptomatic. Discussed pathophysiology of AV block during today's appointment. At this time, I do not see an indication for permanent pacing.   #Hypertension At goal today.  Recommend checking blood pressures 1-2 times per week at home and recording the values.  Recommend bringing these recordings to the primary care physician.  Follow-up as needed with EP.   Signed, Rossie Muskrat. Lalla Brothers, MD, [facility], [facility] 11/27/2022 1:36 PM    Electrophysiology [facility]

## 2022-11-27 ENCOUNTER — Ambulatory Visit: Payer: Medicare Other | Attending: Cardiology | Admitting: Cardiology

## 2022-11-27 ENCOUNTER — Encounter: Payer: Self-pay | Admitting: Cardiology

## 2022-11-27 VITALS — BP 136/64 | HR 79 | Ht 69.0 in | Wt 169.0 lb

## 2022-11-27 DIAGNOSIS — I10 Essential (primary) hypertension: Secondary | ICD-10-CM

## 2022-11-27 DIAGNOSIS — I441 Atrioventricular block, second degree: Secondary | ICD-10-CM

## 2022-11-27 NOTE — Patient Instructions (Signed)
Medication Instructions:  Your physician recommends that you continue on your current medications as directed. Please refer to the Current Medication list given to you today.  *If you need a refill on your cardiac medications before your next appointment, please call your pharmacy*   Lab Work: None ordered.  If you have labs (blood work) drawn today and your tests are completely normal, you will receive your results only by: MyChart Message (if you have MyChart) OR A paper copy in the mail If you have any lab test that is abnormal or we need to change your treatment, we will call you to review the results.   Testing/Procedures: None ordered.    Follow-Up: At [facility], you and your health needs are our priority.  As part of our continuing mission to provide you with exceptional heart care, we have created designated Provider Care Teams.  These Care Teams include your primary Cardiologist (physician) and Advanced Practice Providers (APPs -  Physician Assistants and Nurse Practitioners) who all work together to provide you with the care you need, when you need it.  We recommend signing up for the patient portal called "MyChart".  Sign up information is provided on this After Visit Summary.  MyChart is used to connect with patients for Virtual Visits (Telemedicine).  Patients are able to view lab/test results, encounter notes, upcoming appointments, etc.  Non-urgent messages can be sent to your provider as well.   To learn more about what you can do with MyChart, go to [email or web address].    Your next appointment:   Follow up with Dr Lalla Brothers as needed.

## 2022-12-06 ENCOUNTER — Ambulatory Visit: Payer: Medicare Other | Admitting: Cardiology

## 2022-12-07 ENCOUNTER — Encounter: Payer: Self-pay | Admitting: Surgery

## 2022-12-07 ENCOUNTER — Telehealth: Payer: Self-pay | Admitting: Cardiology

## 2022-12-07 ENCOUNTER — Ambulatory Visit (INDEPENDENT_AMBULATORY_CARE_PROVIDER_SITE_OTHER): Payer: Medicare Other | Admitting: Surgery

## 2022-12-07 VITALS — BP 154/66 | HR 61 | Temp 98.1°F | Ht 69.0 in | Wt 170.0 lb

## 2022-12-07 DIAGNOSIS — K4020 Bilateral inguinal hernia, without obstruction or gangrene, not specified as recurrent: Secondary | ICD-10-CM | POA: Diagnosis not present

## 2022-12-07 NOTE — Progress Notes (Unsigned)
12/07/2022  History of Present Illness: Jimmy Mcintyre is a 77 y.o. male presenting for follow up of bilateral inguinal hernias.  Past Medical History: Past Medical History:  Diagnosis Date   Abnormal prostate specific antigen 11/16/2013   Arteriosclerosis of coronary artery 10/29/2014   Overview:  pci stent of lad    Arthritis, degenerative 09/30/2013   Overview:   a.  Shoulders severe.   b.  Cervical spine.   c.  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   Coronary artery disease    Degeneration of lumbar or lumbosacral intervertebral disc    Diabetes mellitus type 2, uncontrolled 11/16/2013   Elevated PSA    Essential (primary) hypertension 10/29/2014   GERD (gastroesophageal reflux disease)    Glaucoma    H/O Mobitz type II block    intermittent   Hypertension    Hypotestosteronemia    Leukocytosis    OSA (obstructive sleep apnea) 04/20/2014   does not use nocturnal PAP therapy   Osteoarthritis    SOB (shortness of breath) 09/30/2013   TI (tricuspid incompetence) 04/29/2014     Past Surgical History: Past Surgical History:  Procedure Laterality Date   APPENDECTOMY     CHOLECYSTECTOMY     COLONOSCOPY     CORONARY ANGIOPLASTY WITH STENT PLACEMENT  2001   CORONARY BALLOON ANGIOPLASTY N/A 04/20/2022   Procedure: CORONARY BALLOON ANGIOPLASTY;  Surgeon: Alwyn Pea, MD;  Location: [facility] INVASIVE CV LAB;  Service: Cardiovascular;  Laterality: N/A;   ERCP N/A 08/02/2020   Procedure: ENDOSCOPIC RETROGRADE CHOLANGIOPANCREATOGRAPHY (ERCP);  Surgeon: Midge Minium, MD;  Location: [facility] ENDOSCOPY;  Service: Endoscopy;  Laterality: N/A;   LEFT HEART CATH AND CORONARY ANGIOGRAPHY Left 04/20/2022   Procedure: LEFT HEART CATH AND CORONARY ANGIOGRAPHY;  Surgeon: Alwyn Pea, MD;  Location: [facility] INVASIVE CV LAB;  Service:  Cardiovascular;  Laterality: Left;   PENILE PROSTHESIS IMPLANT  2000   REVERSE SHOULDER ARTHROPLASTY Left 01/14/2020   Procedure: REVERSE SHOULDER ARTHROPLASTY;  Surgeon: Francena Hanly, MD;  Location: [facility] ORS;  Service: Orthopedics;  Laterality: Left;    SHOULDER SURGERY Right 2014   TONSILLECTOMY     UPPER GI ENDOSCOPY      Home Medications: Prior to Admission medications   Medication Sig Start Date End Date Taking? Authorizing Provider  acetaminophen (TYLENOL) 500 MG tablet Take 1,000 mg by mouth every 6 (six) hours as needed for moderate pain.   Yes [provider]  allopurinol (ZYLOPRIM) 300 MG tablet Take 300 mg by mouth every evening. 11/07/17  Yes [provider]  amLODipine-benazepril (LOTREL) 5-20 MG per capsule Take 1 capsule by mouth in the morning. 11/08/13  Yes [provider]  aspirin EC 81 MG tablet Take 1 tablet (81 mg total) by mouth daily. Swallow whole. 07/12/22  Yes Agbor-Etang, Arlys Pacer, MD  cyanocobalamin (,VITAMIN B-12,) 1000 MCG/ML injection Inject 1,000 mcg into the muscle every 30 (thirty) days.  06/23/14  Yes [provider]  finasteride (PROSCAR) 5 MG tablet Take 5 mg by mouth in the morning. 06/23/14  Yes [provider]  gemfibrozil (LOPID) 600 MG tablet Take 600 mg by mouth 2 (two) times daily before a meal.   Yes [provider]  glimepiride (AMARYL) 4 MG tablet Take 1 tablet by mouth daily with breakfast. 06/10/22  Yes  [provider]  latanoprost (XALATAN) 0.005 % ophthalmic solution SMARTSIG:In Eye(s) 11/11/22  Yes [provider]  meloxicam (MOBIC) 15 MG tablet Take 15 mg by mouth in the morning. 07/27/20  Yes [provider]  pantoprazole (PROTONIX) 40 MG tablet Take 40 mg by mouth daily. 11/11/22  Yes [provider]  pioglitazone (ACTOS) 30 MG tablet Take 30 mg by mouth daily.   Yes [provider]  SYRINGE/NEEDLE, DISP, 1 ML 25G X 5/8" 1 ML MISC Use 1 Syringe monthly.  09/26/16  Yes [provider]  Testosterone 20.25 MG/1.25GM (1.62%) GEL Place 1 application onto the skin in the morning. Shoulders 06/11/20  Yes [provider]  zolpidem (AMBIEN CR) 12.5 MG CR tablet Take 12.5 mg by mouth at bedtime.  04/27/14  Yes [provider]  ezetimibe (ZETIA) 10 MG tablet Take 1 tablet (10 mg total) by mouth daily. 07/12/22 10/10/22  Debbe Odea, MD    Allergies: Allergies  Allergen Reactions   Bactrim [Sulfamethoxazole-Trimethoprim] Rash   Levofloxacin Rash    Review of Systems: Review of Systems  Constitutional:  Negative for chills and fever.  Respiratory:  Negative for shortness of breath.   Cardiovascular:  Negative for chest pain.  Gastrointestinal:  Negative for abdominal pain, nausea and vomiting.  Skin:  Negative for rash.    Physical Exam BP (!) 154/66   Pulse 61   Temp 98.1 F (36.7 C)   Ht 5\' 9"  (1.753 m)   Wt 170 lb (77.1 kg)   SpO2 98%   BMI 25.10 kg/m  CONSTITUTIONAL: No acute distress, well-nourished HEENT:  Normocephalic, atraumatic, extraocular motion intact. RESPIRATORY:  Lungs are clear, and breath sounds are equal bilaterally. Normal respiratory effort without pathologic use of accessory muscles. CARDIOVASCULAR: Heart is regular without murmurs, gallops, or rubs. GI: Exam deferred today.  Has otherwise bilateral inguinal hernias. NEUROLOGIC:  Motor and sensation is grossly normal.  Cranial nerves are grossly intact. PSYCH:  Alert and oriented to person, place and time. Affect is normal.  Labs/Imaging: Labs from 11/29/22: Sodium 140, potassium 4.6, chloride 105, CO2 26, BUN 28, creatinine 1.1.  LFTs within normal limits.  WBC 5.0, hemoglobin 13.7, hematocrit 42.1, platelets 226.  Hemoglobin A1c 6.3.  CT renal study on 07/15/20: FINDINGS: Evaluation of this exam is limited in the absence of intravenous contrast.   Lower chest: Bibasilar linear atelectasis/scarring. The visualized lung bases are  otherwise clear. Partially visualized small pericardial effusion measuring approximately 5 mm in thickness.   No intra-abdominal free air or free fluid.   Hepatobiliary: The liver is unremarkable. No intrahepatic biliary ductal dilatation. Several gallstones. No pericholecystic fluid or evidence of acute cholecystitis. There are multiple stones in the central CBD. The common bile duct is dilated measuring up to 12 mm. MRCP may provide better evaluation of the gallbladder and bile duct.   Pancreas: Unremarkable. No pancreatic ductal dilatation or surrounding inflammatory changes.   Spleen: Normal in size without focal abnormality.   Adrenals/Urinary Tract: The adrenal glands unremarkable. There is no hydronephrosis or nephrolithiasis on either side. There is a 4 cm right renal posterior interpolar cyst. The visualized ureters appear unremarkable. The urinary bladder is decompressed around a Foley catheter.   Stomach/Bowel: There is sigmoid diverticulosis without active inflammatory changes. There is herniation of a short segment of sigmoid colon into the left inguinal canal, new since the prior CT. No evidence of obstruction or inflammation. There is no bowel obstruction. Appendectomy.   Vascular/Lymphatic: Mild aortoiliac atherosclerotic  disease. The IVC is unremarkable. No portal venous gas. There is no adenopathy.   Reproductive: The prostate and seminal vesicles are grossly unremarkable. No pelvic mass. Penile implant with reservoir in the pelvis.   Other: Midline vertical anterior pelvic wall incisional scar. There is a fat containing right inguinal hernia as seen on the prior CT.   Musculoskeletal: Degenerative changes of the spine. No acute osseous pathology.   IMPRESSION: 1. No acute intra-abdominal or pelvic pathology. No hydronephrosis or nephrolithiasis. 2. Cholelithiasis and choledocholithiasis with mild dilatation of the CBD. MRCP may provide better evaluation of  the gallbladder and bile duct. 3. Sigmoid diverticulosis. No bowel obstruction or inflammation. 4. Left inguinal hernia containing a short segment of sigmoid colon, new since the prior CT. 5. Aortic Atherosclerosis (ICD10-I70.0).  Assessment and Plan: This is a 77 y.o. male ***  I spent 30 minutes dedicated to the care of this patient on the date of this encounter to include pre-visit review of records, face-to-face time with the patient discussing diagnosis and management, and any post-visit coordination of care.   Howie Ill, MD [facility]

## 2022-12-07 NOTE — Progress Notes (Signed)
Request for Cardiology clearance has been faxed to Dr Azucena Cecil.

## 2022-12-07 NOTE — Patient Instructions (Signed)
You have chose to have your hernia repaired. This will be done by Dr. Aleen Campi at [facility].  We will request Clearance from your Cardiology doctor about your surgery and if you can stop your Aspirin.   Please see your (blue) Pre-care information that you have been given today. Our surgery scheduler will call you to verify surgery date and to go over information.   You will need to arrange to be out of work for approximately 1-2 weeks and then you may return with a lifting restriction for 4 more weeks. If you have FMLA or Disability paperwork that needs to be filled out, please have your company fax your paperwork to [phone] or you may drop this by either office. This paperwork will be filled out within 3 days after your surgery has been completed.  You may have a bruise in your groin and also swelling and brusing in your testicle area. You may use ice 4-5 times daily for 15-20 minutes each time. Make sure that you place a barrier between you and the ice pack. To decrease the swelling, you may roll up a bath towel and place it vertically in between your thighs with your testicles resting on the towel. You will want to keep this area elevated as much as possible for several days following surgery.    Inguinal Hernia, Adult Muscles help keep everything in the body in its proper place. But if a weak spot in the muscles develops, something can poke through. That is called a hernia. When this happens in the lower part of the belly (abdomen), it is called an inguinal hernia. (It takes its name from a part of the body in this region called the inguinal canal.) A weak spot in the wall of muscles lets some fat or part of the small intestine bulge through. An inguinal hernia can develop at any age. Men get them more often than women. CAUSES  In adults, an inguinal hernia develops over time. It can be triggered by: Suddenly straining the muscles of the lower abdomen. Lifting heavy objects. Straining to have  a bowel movement. Difficult bowel movements (constipation) can lead to this. Constant coughing. This may be caused by smoking or lung disease. Being overweight. Being pregnant. Working at a job that requires long periods of standing or heavy lifting. Having had an inguinal hernia before. One type can be an emergency situation. It is called a strangulated inguinal hernia. It develops if part of the small intestine slips through the weak spot and cannot get back into the abdomen. The blood supply can be cut off. If that happens, part of the intestine may die. This situation requires emergency surgery. SYMPTOMS  Often, a small inguinal hernia has no symptoms. It is found when a healthcare provider does a physical exam. Larger hernias usually have symptoms.  In adults, symptoms may include: A lump in the groin. This is easier to see when the person is standing. It might disappear when lying down. In men, a lump in the scrotum. Pain or burning in the groin. This occurs especially when lifting, straining or coughing. A dull ache or feeling of pressure in the groin. Signs of a strangulated hernia can include: A bulge in the groin that becomes very painful and tender to the touch. A bulge that turns red or purple. Fever, nausea and vomiting. Inability to have a bowel movement or to pass gas. DIAGNOSIS  To decide if you have an inguinal hernia, a healthcare provider will probably  do a physical examination. This will include asking questions about any symptoms you have noticed. The healthcare provider might feel the groin area and ask you to cough. If an inguinal hernia is felt, the healthcare provider may try to slide it back into the abdomen. Usually no other tests are needed. TREATMENT  Treatments can vary. The size of the hernia makes a difference. Options include: Watchful waiting. This is often suggested if the hernia is small and you have had no symptoms. No medical procedure will be done  unless symptoms develop. You will need to watch closely for symptoms. If any occur, contact your healthcare provider right away. Surgery. This is used if the hernia is larger or you have symptoms. Open surgery. This is usually an outpatient procedure (you will not stay overnight in a hospital). An cut (incision) is made through the skin in the groin. The hernia is put back inside the abdomen. The weak area in the muscles is then repaired by herniorrhaphy or hernioplasty. Herniorrhaphy: in this type of surgery, the weak muscles are sewn back together. Hernioplasty: a patch or mesh is used to close the weak area in the abdominal wall. Laparoscopy. In this procedure, a surgeon makes small incisions. A thin tube with a tiny video camera (called a laparoscope) is put into the abdomen. The surgeon repairs the hernia with mesh by looking with the video camera and using two long instruments. HOME CARE INSTRUCTIONS  After surgery to repair an inguinal hernia: You will need to take pain medicine prescribed by your healthcare provider. Follow all directions carefully. You will need to take care of the wound from the incision. Your activity will be restricted for awhile. This will probably include no heavy lifting for several weeks. You also should not do anything too active for a few weeks. When you can return to work will depend on the type of job that you have. During "watchful waiting" periods, you should: Maintain a healthy weight. Eat a diet high in fiber (fruits, vegetables and whole grains). Drink plenty of fluids to avoid constipation. This means drinking enough water and other liquids to keep your urine clear or pale yellow. Do not lift heavy objects. Do not stand for long periods of time. Quit smoking. This should keep you from developing a frequent cough. SEEK MEDICAL CARE IF:  A bulge develops in your groin area. You feel pain, a burning sensation or pressure in the groin. This might be worse if  you are lifting or straining. You develop a fever of more than 100.5 F (38.1 C). SEEK IMMEDIATE MEDICAL CARE IF:  Pain in the groin increases suddenly. A bulge in the groin gets bigger suddenly and does not go down. For men, there is sudden pain in the scrotum. Or, the size of the scrotum increases. A bulge in the groin area becomes red or purple and is painful to touch. You have nausea or vomiting that does not go away. You feel your heart beating much faster than normal. You cannot have a bowel movement or pass gas. You develop a fever of more than 102.0 F (38.9 C).   This information is not intended to replace advice given to you by your health care provider. Make sure you discuss any questions you have with your health care provider.   Document Released: 07/08/2008 Document Revised: 05/14/2011 Document Reviewed: 08/23/2014 Elsevier Interactive Patient Education [employer]

## 2022-12-07 NOTE — Telephone Encounter (Signed)
Patient Name: Jimmy Mcintyre  DOB: [birth year] MRN: [id number]  Primary Cardiologist: Debbe Odea, MD  Chart reviewed as part of pre-operative protocol coverage. Given past medical history and time since last visit, based on ACC/AHA guidelines, [name] is at acceptable risk for the planned procedure without further cardiovascular testing.   Per Dr. Azucena Cecil note 08/31/2022 and will carry over to this clearance   "Left inguinal hernia procedure being planned in the near future. Patient has chronic stable cardiac disease. EP evaluation pending, if pacemaker not indicated, okay to proceed with inguinal surgery from a cardiac perspective. Last EF was normal, asymptomatic."   Per Dr. Lalla Brothers note 11/27/2022 "Patient with intermittent Mobitz I AV block. There are also ECG's and rhythm strips showing blocked PACs and not Mobitz II or higher degree AV block. He is asymptomatic. Discussed pathophysiology of AV block during today's appointment. At this time, I do not see an indication for permanent pacing."  Per office protocol, if patient is without any new symptoms or concerns at the time of their virtual visit, he/she may hold ASA for 7 days prior to procedure. Please resume ASA as soon as possible postprocedure, at the discretion of the surgeon.    The patient was advised that if he develops new symptoms prior to surgery to contact our office to arrange for a follow-up visit, and he verbalized understanding.  I will route this recommendation to the requesting party via Epic fax function and remove from pre-op pool.  Please call with questions.  Joni Reining, NP 12/07/2022, 12:48 PM

## 2022-12-07 NOTE — Telephone Encounter (Signed)
Pre-operative Risk Assessment    Patient Name: Jimmy Mcintyre  DOB: 12-Aug-1945 MRN: [id number]      Request for Surgical Clearance    Procedure:   open bilateral inguinal hernia repair   Date of Surgery:  Clearance 01/10/23                                 Surgeon:  Dr Henrene Dodge Surgeon's Group or Practice Name:  [facility] Phone number:  [phone] Fax number:  [phone]   Type of Clearance Requested:   - Pharmacy:  Hold Aspirin stop aspirin prior   Type of Anesthesia:  General    Additional requests/questions:    SignedNorman Herrlich   12/07/2022, 11:57 AM

## 2022-12-10 ENCOUNTER — Telehealth: Payer: Self-pay | Admitting: Surgery

## 2022-12-10 NOTE — Telephone Encounter (Signed)
Patient has been advised of Pre-Admission date/time, and Surgery date at [facility].  Surgery Date: 01/10/23 Preadmission Testing Date: 01/02/23 (phone 1p-4p)  Patient has been made aware to call [phone], between 1-3:00pm the day before surgery, to find out what time to arrive for surgery.

## 2022-12-10 NOTE — Progress Notes (Addendum)
Cardiology clearance has been received from Joni Reining, NP. The patient is at acceptable risk for surgery. He may hold his Aspirin for 7 days prior. All notes are in Epic.  Patient is aware to hold his aspirin for 7 days prior to surgery.

## 2023-01-02 ENCOUNTER — Encounter
Admission: RE | Admit: 2023-01-02 | Discharge: 2023-01-02 | Disposition: A | Discharge status: Home / Self Care | Payer: Medicare Other | Source: Ambulatory Visit | Attending: Surgery | Admitting: Surgery

## 2023-01-02 VITALS — Ht 69.0 in | Wt 170.0 lb

## 2023-01-02 DIAGNOSIS — Z01818 Encounter for other preprocedural examination: Secondary | ICD-10-CM

## 2023-01-02 HISTORY — DX: Benign prostatic hyperplasia without lower urinary tract symptoms: N40.0

## 2023-01-02 HISTORY — DX: Malignant neoplasm of prostate: C61

## 2023-01-02 HISTORY — DX: Spinal stenosis, lumbar region without neurogenic claudication: M48.061

## 2023-01-02 HISTORY — DX: Anemia, unspecified: D64.9

## 2023-01-02 HISTORY — DX: Chronic kidney disease, stage 3 unspecified: N18.30

## 2023-01-02 HISTORY — DX: Male erectile dysfunction, unspecified: N52.9

## 2023-01-02 HISTORY — DX: Alcohol use, unspecified, uncomplicated: F10.90

## 2023-01-02 HISTORY — DX: Presence of urogenital implants: Z96.0

## 2023-01-02 HISTORY — DX: Diverticulosis of large intestine without perforation or abscess without bleeding: K57.30

## 2023-01-02 HISTORY — DX: Gout, unspecified: M10.9

## 2023-01-02 HISTORY — DX: Acute respiratory failure, unspecified whether with hypoxia or hypercapnia: J96.00

## 2023-01-02 HISTORY — DX: Other specified health status: Z78.9

## 2023-01-02 HISTORY — DX: Calculus of gallbladder without cholecystitis without obstruction: K80.20

## 2023-01-02 HISTORY — DX: Scoliosis, unspecified: M41.9

## 2023-01-02 HISTORY — DX: Personal history of other diseases of the digestive system: Z87.19

## 2023-01-02 HISTORY — DX: Calculus of bile duct without cholangitis or cholecystitis without obstruction: K80.50

## 2023-01-02 HISTORY — DX: Atrioventricular block, second degree: I44.1

## 2023-01-02 NOTE — Patient Instructions (Signed)
Your procedure is scheduled on:01-10-23 Thursday Report to the Registration Desk on the 1st floor of the [facility].Then proceed to the 2nd floor Surgery Desk To find out your arrival time, please call [phone] between 1PM - 3PM on:01-09-23 Wednesday If your arrival time is 6:00 am, do not arrive before that time as the [facility] entrance doors do not open until 6:00 am.  REMEMBER: Instructions that are not followed completely may result in serious medical risk, up to and including death; or upon the discretion of your surgeon and anesthesiologist your surgery may need to be rescheduled.  Do not eat food after midnight the night before surgery.  No gum chewing or hard candies.  You may however, drink Water up to 2 hours before you are scheduled to arrive for your surgery. Do not drink anything within 2 hours of your scheduled arrival time.  One week prior to surgery: Stop Anti-inflammatories (NSAIDS) such as Advil, Aleve, Ibuprofen, Motrin, Naproxen, Naprosyn and Aspirin based products such as Excedrin, Goody's Powder, BC Powder. Stop ANY OVER THE COUNTER supplements until after surgery.  You may however, continue to take Tylenol if needed for pain up until the day of surgery.  Stop 81 mg Aspirin 7 days prior to surgery-Last dose will be on 01-02-23 Wednesday  -Stop your meloxicam [facility]) NOW 01-02-23 Wednesday  Continue taking all of your other prescription medications up until the day of surgery.  ON THE DAY OF SURGERY ONLY TAKE THESE MEDICATIONS WITH SIPS OF WATER: -finasteride (PROSCAR)  -pantoprazole (PROTONIX)   No Alcohol for 24 hours before or after surgery.  No Smoking including e-cigarettes for 24 hours before surgery.  No chewable tobacco products for at least 6 hours before surgery.  No nicotine patches on the day of surgery.  Do not use any "recreational" drugs for at least a week (preferably 2 weeks) before your surgery.  Please be advised that the  combination of cocaine and anesthesia may have negative outcomes, up to and including death. If you test positive for cocaine, your surgery will be cancelled.  On the morning of surgery brush your teeth with toothpaste and water, you may rinse your mouth with mouthwash if you wish. Do not swallow any toothpaste or mouthwash.  Use CHG Soap as directed on instruction sheet.  Do not wear jewelry, make-up, hairpins, clips or nail polish.  For welded (permanent) jewelry: bracelets, anklets, waist bands, etc.  Please have this removed prior to surgery.  If it is not removed, there is a chance that hospital personnel will need to cut it off on the day of surgery.  Do not wear lotions, powders, or perfumes.   Do not shave body hair from the neck down 48 hours before surgery.  Contact lenses, hearing aids and dentures may not be worn into surgery.  Do not bring valuables to the hospital. [facility] is not responsible for any missing/lost belongings or valuables.   Notify your doctor if there is any change in your medical condition (cold, fever, infection).  Wear comfortable clothing (specific to your surgery type) to the hospital.  After surgery, you can help prevent lung complications by doing breathing exercises.  Take deep breaths and cough every 1-2 hours. Your doctor may order a device called an Incentive Spirometer to help you take deep breaths. When coughing or sneezing, hold a pillow firmly against your incision with both hands. This is called "splinting." Doing this helps protect your incision. It also decreases belly discomfort.  If you are being admitted to the hospital overnight, leave your suitcase in the car. After surgery it may be brought to your room.  In case of increased patient census, it may be necessary for you, the patient, to continue your postoperative care in the Same Day Surgery department.  If you are being discharged the day of surgery, you will not be allowed to  drive home. You will need a responsible individual to drive you home and stay with you for 24 hours after surgery.   If you are taking public transportation, you will need to have a responsible individual with you.  Please call the Pre-admissions Testing Dept. at [phone] if you have any questions about these instructions.  Surgery Visitation Policy:  Patients having surgery or a procedure may have two visitors.  Children under the age of 20 must have an adult with them who is not the patient.     Preparing for Surgery with CHLORHEXIDINE GLUCONATE (CHG) Soap  Chlorhexidine Gluconate (CHG) Soap  o An antiseptic cleaner that kills germs and bonds with the skin to continue killing germs even after washing  o Used for showering the night before surgery and morning of surgery  Before surgery, you can play an important role by reducing the number of germs on your skin.  CHG (Chlorhexidine gluconate) soap is an antiseptic cleanser which kills germs and bonds with the skin to continue killing germs even after washing.  Please do not use if you have an allergy to CHG or antibacterial soaps. If your skin becomes reddened/irritated stop using the CHG.  1. Shower the NIGHT BEFORE SURGERY and the MORNING OF SURGERY with CHG soap.  2. If you choose to wash your hair, wash your hair first as usual with your normal shampoo.  3. After shampooing, rinse your hair and body thoroughly to remove the shampoo.  4. Use CHG as you would any other liquid soap. You can apply CHG directly to the skin and wash gently with a scrungie or a clean washcloth.  5. Apply the CHG soap to your body only from the neck down. Do not use on open wounds or open sores. Avoid contact with your eyes, ears, mouth, and genitals (private parts). Wash face and genitals (private parts) with your normal soap.  6. Wash thoroughly, paying special attention to the area where your surgery will be performed.  7. Thoroughly  rinse your body with warm water.  8. Do not shower/wash with your normal soap after using and rinsing off the CHG soap.  9. Pat yourself dry with a clean towel.  10. Wear clean pajamas to bed the night before surgery.  12. Place clean sheets on your bed the night of your first shower and do not sleep with pets.  13. Shower again with the CHG soap on the day of surgery prior to arriving at the hospital.  14. Do not apply any deodorants/lotions/powders.  15. Please wear clean clothes to the hospital.

## 2023-01-08 ENCOUNTER — Encounter: Payer: Self-pay | Admitting: Surgery

## 2023-01-08 NOTE — Progress Notes (Signed)
Perioperative / Anesthesia Services  Pre-Admission Testing Clinical Review / Pre-Operative Anesthesia Consult  Date: 01/08/23  Patient Demographics:  Name: Jimmy Mcintyre DOB:   23-Dec-1945 MRN:   [id number]  Planned Surgical Procedure(s):    Case: [id number] Date/Time: 01/10/23 0715   Procedure: HERNIA REPAIR INGUINAL ADULT, open (Bilateral)   Anesthesia type: General   Pre-op diagnosis: bilateral inguinal hernia, non-recurrent   Location: [facility] OR ROOM 04 / [facility]   Surgeons: Henrene Dodge, MD     NOTE: Available PAT nursing documentation and vital signs have been reviewed. Clinical nursing staff has updated patient's PMH/PSHx, current medication list, and drug allergies/intolerances to ensure comprehensive history available to assist in medical decision making as it pertains to the aforementioned surgical procedure and anticipated anesthetic course. Extensive review of available clinical information personally performed. Ava PMH and PSHx updated with any diagnoses/procedures that  may have been inadvertently omitted during his intake with the pre-admission testing department's nursing staff.  Clinical Discussion:  Jimmy Mcintyre is a 77 y.o. male who is submitted for pre-surgical anesthesia review and clearance prior to him undergoing the above procedure. Patient has never been a smoker. 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  Patient is followed by cardiology Azucena Cecil, MD). He was last seen in the cardiology clinic on 08/31/2022; notes reviewed. At the time of his clinic visit, patient doing well overall from a cardiovascular perspective. Patient  denied any chest pain, shortness of breath, PND, orthopnea, palpitations, significant peripheral edema, weakness, fatigue, vertiginous symptoms, or presyncope/syncope. Patient with a past medical history significant for cardiovascular diagnoses. Documented physical exam was grossly benign, providing no evidence of acute exacerbation and/or decompensation of the patient's known cardiovascular conditions.  Of note, patient has received care outside of the state of [place].  Complete records regarding his cardiovascular history unavailable for review at time of consult.  Information gathered from patient report and from notes provided by his local cardiologist.  Patient reported to have undergone PCI of the LAD back in the 1990s.  Procedure was performed in [place].  Unknown type stent was placed to the LAD.  Most recent TTE was performed on 04/04/2022 revealing a normal left ventricular systolic function with an EF of >55%.  There were no regional wall motion abnormalities.  Mild concentric LVH noted.  The left atrium was mildly enlarged.  Aortic, mitral, and tricuspid valve all with thickened leaflets.  There was mitral annular calcification.  Trivial to mild pan valvular regurgitation noted.  RVSP = 36.5 mmHg.  All transvalvular gradients were noted to be normal providing no evidence suggestive of valvular stenosis.  Aorta normal in size with no evidence of aneurysmal dilatation.  Most recent myocardial perfusion imaging study was performed on 04/04/2022 revealing a normal left ventricular systolic function with an EF of 62%.  There were no regional wall motion abnormalities.  Stress images demonstrated a small reversible perfusion abnormality of moderate intensity present in the apical region.  Findings consistent with mild to moderate apical ischemia.  Further evaluation was recommended.  Patient underwent diagnostic LEFT heart catheterization on 04/20/2022 revealing 99% ISR of the previously placed stent to the mid  LAD.  LM, LCx, and RCA all with mild luminal irregularities.  PCI was attempted, however interventional cardiologist was unable to cross the lesion.  POBA performed reducing 99% mid LAD lesion down to 25%.  Following angioplasty, TIMI-3 flow was observed.  Long-term cardiac event monitor study performed on 08/16/2022 revealing a predominant underlying sinus rhythm.  Monitor demonstrated occasional episodes of both Mobitz 1 and Mobitz 2 AV block.  Patient was referred to electrophysiology for further evaluation.  Patient was seen in consult on 11/27/2022 by Dr. Steffanie Dunn; notes reviewed.  MD felt as if Mobitz 1 AV block was present, however rhythm strips showed blocked PACs rather than Mobitz 2 or higher degree block.  Patient asymptomatic.  No indication for permanent pacemaker placement at that time.  Blood pressure well controlled at 124/60 mmHg on currently prescribed CCB (amlodipine) and ACEi (benazepril therapies.  Due to statin intolerance, patient was not noted to be on any type of lipid-lowering therapies for his HLD diagnosis and further ASCVD prevention. in the setting of known cardiovascular disease, it is important note that patient is on exogenous testosterone gel for hypogonadism/erectile dysfunction diagnosis.  T2DM reasonably controlled on currently prescribed regimen; last HgbA1c at the time of his cardiology visit was 7.3 when checked on 08/23/2022.  Of note, hemoglobin A1c has been rechecked since patient was last seen by his cardiologist with further improvement down to 6.3% when checked on 11/29/2022.  He does have an OSAH diagnosis, however he does not utilize nocturnal PAP therapy.  Functional capacity limited by patient's age and multiple medical comorbidities.  With that said, patient is able to complete his ADLs/IADLs without cardiovascular limitation.  Per the [facility], patient is able to achieve at least 4 METS of physical activity without experiencing any significant degrees of  angina/anginal equivalent symptoms.  No other changes were made to his medication regimen.  Patient scheduled to follow-up with outpatient cardiology in 3 months or sooner if needed.  Dallon Dacosta is scheduled for an elective HERNIA REPAIR INGUINAL ADULT, open (Bilateral) on 01/10/2023 with Dr. Ernesto Rutherford, MD.  Given patient's past medical history significant for cardiovascular diagnoses, presurgical cardiac clearance was sought by the PAT team.  Per cardiology, "inguinal hernia procedure being planned in the near future. Patient has chronic stable cardiac disease. EP evaluation pending, if pacemaker not indicated, okay to proceed with inguinal surgery from a cardiac perspective at an ACCEPTABLE risk".  In review of his medication reconciliation, it is noted that patient is currently on prescribed daily antithrombotic therapy. He has been instructed on recommendations for holding his daily low-dose ASA for 7 days prior to his procedure with plans to restart as soon as postoperative bleeding risk felt to be minimized by his attending surgeon. The patient has been instructed that his last dose of his ASA should be on 01/02/2023.  Patient denies previous perioperative complications with anesthesia in the past. In review of the available records, it is noted that patient underwent a general anesthetic course here at [facility] ([facility]) in 08/2020 without documented complications.      01/02/2023    1:00 PM 12/07/2022   10:42 AM 11/27/2022    1:23 PM  Vitals with BMI  Height 5\' 9"  5\' 9"  5\' 9"   Weight 170 lbs 170 lbs 169 lbs  BMI 25.09 25.09 24.95  Systolic  154 136  Diastolic  66 64  Pulse  61 79    Providers/Specialists:   NOTE: Primary physician provider listed below. Patient may have been seen by APP or partner  within same practice.   PROVIDER ROLE / SPECIALTY LAST Eligha Bridegroom, MD General Surgery (Surgeon) 12/07/2022  Marguarite Arbour, MD Primary  Care Provider 11/29/2022  Debbe Odea, MD Cardiology 08/31/2022  Steffanie Dunn, MD Electrophysiology 11/27/2022   Allergies:  Bactrim [sulfamethoxazole-trimethoprim] and Levofloxacin  Current Home Medications:   No current facility-administered medications for this encounter.    acetaminophen (TYLENOL) 500 MG tablet   allopurinol (ZYLOPRIM) 300 MG tablet   amLODipine-benazepril (LOTREL) 5-20 MG per capsule   aspirin EC 81 MG tablet   cyanocobalamin (,VITAMIN B-12,) 1000 MCG/ML injection   ezetimibe (ZETIA) 10 MG tablet   finasteride (PROSCAR) 5 MG tablet   glimepiride (AMARYL) 4 MG tablet   latanoprost (XALATAN) 0.005 % ophthalmic solution   meloxicam (MOBIC) 15 MG tablet   pantoprazole (PROTONIX) 40 MG tablet   pioglitazone (ACTOS) 30 MG tablet   Testosterone 20.25 MG/1.25GM (1.62%) GEL   zolpidem (AMBIEN CR) 12.5 MG CR tablet   SYRINGE/NEEDLE, DISP, 1 ML 25G X 5/8" 1 ML MISC   History:   Past Medical History:  Diagnosis Date   Alcohol use    Anemia    Barrett's esophagus without dysplasia    Benign essential HTN 10/11/2014   Bilateral inguinal hernia    BPH (benign prostatic hyperplasia)    Bradycardia    Choledocholithiasis    Cholelithiasis    CKD (chronic kidney disease), stage III (HCC)    Combined fat and carbohydrate induced hyperlipemia 10/29/2014   Coronary artery disease    a.) PCI of the LAD in the 1990s done in [place]; b.) MV 10/25/2016: no ischemia; c.) MV 04/04/2022: small, moderate intensity, apical perfusion defect c/w mild-mod ischemia; d.) LHC 04/20/2022: 95-99% ISR mLAD --> unable to cross lesion --> POBA reducing lesion to 25%   Degeneration of lumbar or lumbosacral intervertebral disc    Diabetes mellitus type 2, uncontrolled 11/16/2013   ED (erectile dysfunction)    a.) s/p penile implant in ~ 2000; b.) on exogenous TRT (gel)   Elevated PSA    GERD (gastroesophageal reflux disease)    Glaucoma    Gout    History of hiatal hernia     Hypertension    Hypotestosteronemia    a.) on exogenous TRT (testosterone gel)   Insomnia    a,) on hypnotic PRN (zolpidem)   Leukocytosis    Long term current use of aspirin    Lumbar stenosis    Occassional Mobitz 1 AV block    a.) Zio patch study 08/2022   Occassional Mobitz 2 AV block    a.) Zio patch study 08/2022   OSA (obstructive sleep apnea) 04/20/2014   a.) does not use nocturnal PAP therapy   Osteoarthritis    Prostate cancer (HCC)    Scoliosis of thoracic spine    Sigmoid diverticulosis    SOB (shortness of breath) 09/30/2013   Past Surgical History:  Procedure Laterality Date   APPENDECTOMY     CHOLECYSTECTOMY     COLONOSCOPY     CORONARY ANGIOPLASTY WITH STENT PLACEMENT     CORONARY BALLOON ANGIOPLASTY N/A 04/20/2022   Procedure: CORONARY BALLOON ANGIOPLASTY;  Surgeon: Alwyn Pea, MD;  Location: [facility] INVASIVE CV LAB;  Service: Cardiovascular;  Laterality: N/A;   ERCP N/A 08/02/2020   Procedure: ENDOSCOPIC RETROGRADE CHOLANGIOPANCREATOGRAPHY (ERCP);  Surgeon: Midge Minium, MD;  Location: [facility] ENDOSCOPY;  Service: Endoscopy;  Laterality: N/A;   LEFT HEART CATH AND CORONARY ANGIOGRAPHY Left 04/20/2022   Procedure: LEFT HEART  CATH AND CORONARY ANGIOGRAPHY;  Surgeon: Alwyn Pea, MD;  Location: [facility] INVASIVE CV LAB;  Service: Cardiovascular;  Laterality: Left;   PENILE PROSTHESIS IMPLANT  2000   REVERSE SHOULDER ARTHROPLASTY Left 01/14/2020   Procedure: REVERSE SHOULDER ARTHROPLASTY;  Surgeon: Francena Hanly, MD;  Location: [facility] ORS;  Service: Orthopedics;  Laterality: Left;    TONSILLECTOMY     TOTAL SHOULDER ARTHROPLASTY Right    UPPER GI ENDOSCOPY     Family History  Problem Relation Age of Onset   CAD Mother    Diabetes Mellitus II Mother    CAD Father    Prostate cancer Neg Hx    Bladder Cancer Neg Hx    Social History   Tobacco Use   Smoking status: Never    Passive exposure: Never   Smokeless tobacco: Never  Vaping Use   Vaping  status: Never Used  Substance Use Topics   Alcohol use: Not Currently    Alcohol/week: 7.0 standard drinks of alcohol    Types: 7 Shots of liquor per week    Comment: drink daily bourban-pt denies any drinking   Drug use: No    Pertinent Clinical Results:  LABS:   No visits with results within 3 Day(s) from this visit.  Latest known visit with results is:  Admission on 04/20/2022, Discharged on 04/21/2022  Component Date Value Ref Range Status   Glucose-Capillary 04/20/2022 131 (H)  70 - 99 mg/dL Final   Glucose reference range applies only to samples taken after fasting for at least 8 hours.   Activated Clotting Time 04/20/2022 233  seconds Final   Reference range 74-137 seconds for patients not on anticoagulant therapy.   Activated Clotting Time 04/20/2022 314  seconds Final   Reference range 74-137 seconds for patients not on anticoagulant therapy.   Activated Clotting Time 04/20/2022 347  seconds Final   Reference range 74-137 seconds for patients not on anticoagulant therapy.   Sodium 04/21/2022 139  135 - 145 mmol/L Final   Potassium 04/21/2022 3.9  3.5 - 5.1 mmol/L Final   Chloride 04/21/2022 110  98 - 111 mmol/L Final   CO2 04/21/2022 22  22 - 32 mmol/L Final   Glucose, Bld 04/21/2022 132 (H)  70 - 99 mg/dL Final   Glucose reference range applies only to samples taken after fasting for at least 8 hours.   BUN 04/21/2022 20  8 - 23 mg/dL Final   Creatinine, Ser 04/21/2022 0.95  0.61 - 1.24 mg/dL Final   Calcium 96/29/5284 8.9  8.9 - 10.3 mg/dL Final   GFR, Estimated 04/21/2022 >60  >60 mL/min Final   Comment: (NOTE) Calculated using the CKD-EPI Creatinine Equation (2021)    Anion gap 04/21/2022 7  5 - 15 Final   Performed at [facility], [address] [place], [place] [address]   WBC 04/21/2022 9.9  4.0 - 10.5 K/uL Final   RBC 04/21/2022 4.59  4.22 - 5.81 MIL/uL Final   Hemoglobin 04/21/2022 13.1  13.0 - 17.0 g/dL Final   HCT 03/07/7251 40.0  39.0 - 52.0  % Final   MCV 04/21/2022 87.1  80.0 - 100.0 fL Final   MCH 04/21/2022 28.5  26.0 - 34.0 pg Final   MCHC 04/21/2022 32.8  30.0 - 36.0 g/dL Final   RDW 66/44/0347 14.3  11.5 - 15.5 % Final   Platelets 04/21/2022 224  150 - 400 K/uL Final   nRBC 04/21/2022 0.0  0.0 - 0.2 % Final  Performed at [facility], [address] [place], [place] [address]   Lipoprotein (a) 04/21/2022 116.3 (H)  <75.0 nmol/L Final   Comment: (NOTE) Note:  Values greater than or equal to 75.0 nmol/L may       indicate an independent risk factor for CHD,       but must be evaluated with caution when applied       to non-Caucasian populations due to the       influence of genetic factors on Lp(a) across       ethnicities. Performed At: [facility] [address] [place], [place] [id number] Jolene Schimke MD OZ:[phone]     ECG: Date: 11/24/2022 Time ECG obtained: 1322 PM Rate: 79 bpm Rhythm:  Sinus rhythm with PACs Axis (leads I and aVF): Normal Intervals: PR 160 ms. QRS 72 ms. QTc 417 ms. ST segment and T wave changes: No evidence of acute ST segment elevation or depression.   Comparison: Previous tracing obtained on 07/12/2022 showed intermittent Mobitz 2 AV block at a rate of 54 bpm    IMAGING / PROCEDURES: LONG TERM CARDIAC EVENT MONITOR STUDY performed on 08/16/2022 Patch Wear Time:  12 days and 20 hours (2024-05-23T12:07:47-0400 to 2024-06-05T09:02:50-0400) Patient had a min HR of 24 bpm, max HR of 135 bpm, and avg HR of 59 bpm. Predominant underlying rhythm was Sinus Rhythm.  Cardiac monitor showed occasional Mobitz 2 AV block and Mobitz 1 AV block Plan to refer patient to EP for additional input  LEFT HEART CATHETERIZATION AND CORONARY ANGIOGRAPHY performed on 04/20/2022 Normal left ventricular systolic function with an EF of 55 to 65% LVEDP normal 95-99% ISR of the previously placed stent to the mid LAD Minor luminal irregularities noted in the LM, LCx, and RCA. All  vessels have TIMI-3 flow Stenting of the lesion attempted, however unable to cross wire.  POBA performed reducing lesion to 25%. Consider staged intervention for possible stent deployment within the stent at a later date if clinically warranted.   MYOCARDIAL PERFUSION IMAGING STUDY (LEXISCAN) performed on 04/04/2022 Normal left ventricular systolic function with an EF of 62% No regional wall motion abnormalities No artifact SPECT images demonstrate a small reversible perfusion abnormality of moderate intensity present in the apical region on stress.   Findings consistent with mild to moderate apical ischemia.  TRANSTHORACIC ECHOCARDIOGRAM performed on 04/04/2022 Normal left ventricular systolic function with an EF of >55% No regional wall motion abnormalities Mild concentric LVH Thickened mitral valve leaflets with annular calcification Aortic valve sclerosis with no evidence of stenosis Thickened tricuspid valve leaflets Trivial AR and PR Mild MR and TR RVSP 36.5 mmHg Normal gradients; no valvular stenosis No pericardial effusion  MR LUMBAR SPINE WO CONTRAST performed on 02/12/2022 A transitional lumbosacral vertebra is assumed to represent the S1 level. Careful correlation with this numbering strategy prior to any procedural intervention would be recommended. This matches numbering used on the 07/15/2019 exam. Lumbar spondylosis, degenerative disc disease, and congenitally short pedicles causing moderate to prominent impingement at L5-S1; moderate impingement at L3-4 and L4-5; mild impingement at L2-3, as noted above. Impingement at L2-3 and L5-S1 mildly worsened from prior.  Impression and Plan:  Jimmy Mcintyre has been referred for pre-anesthesia review and clearance prior to him undergoing the planned anesthetic and procedural courses. Available labs, pertinent testing, and imaging results were personally reviewed by me in preparation for upcoming operative/procedural course. [facility]  Health medical record has been updated following extensive record review and patient interview with  PAT staff.   This patient has been appropriately cleared by cardiology with an overall ACCEPTABLE risk of experiencing significant perioperative cardiovascular complications. Based on clinical review performed today (01/08/23), barring any significant acute changes in the patient's overall condition, it is anticipated that he will be able to proceed with the planned surgical intervention. Any acute changes in clinical condition may necessitate his procedure being postponed and/or cancelled. Patient will meet with anesthesia team (MD and/or CRNA) on the day of his procedure for preoperative evaluation/assessment. Questions regarding anesthetic course will be fielded at that time.   Pre-surgical instructions were reviewed with the patient during his PAT appointment, and questions were fielded to satisfaction by PAT clinical staff. He has been instructed on which medications that he will need to hold prior to surgery, as well as the ones that have been deemed safe/appropriate to take on the day of his procedure. As part of the general education provided by PAT, patient made aware both verbally and in writing, that he would need to abstain from the use of any illegal substances during his perioperative course.  He was advised that failure to follow the provided instructions could necessitate case cancellation or result in serious perioperative complications up to and including death. Patient encouraged to contact PAT and/or his surgeon's office to discuss any questions or concerns that may arise prior to surgery; verbalized understanding.   Quentin Mulling, MSN, APRN, FNP-C, CEN [facility] [place] [facility]  Perioperative Services Nurse Practitioner Phone: [phone] Fax: [phone] 01/08/23 10:16 AM  NOTE: This note has been prepared using Dragon dictation software. Despite my best ability to proofread,  there is always the potential that unintentional transcriptional errors may still occur from this process.

## 2023-01-09 ENCOUNTER — Encounter: Payer: Self-pay | Admitting: Surgery

## 2023-01-09 MED ORDER — CHLORHEXIDINE GLUCONATE CLOTH 2 % EX PADS
6.0000 | MEDICATED_PAD | Freq: Once | CUTANEOUS | Status: AC
  Administered 2023-01-10: 6 via TOPICAL

## 2023-01-09 MED ORDER — ACETAMINOPHEN 500 MG PO TABS
1000.0000 mg | ORAL_TABLET | ORAL | Status: AC
  Administered 2023-01-10: 1000 mg via ORAL

## 2023-01-09 MED ORDER — SODIUM CHLORIDE 0.9 % IV SOLN: INTRAVENOUS | Status: DC

## 2023-01-09 MED ORDER — CEFAZOLIN SODIUM-DEXTROSE 2-4 GM/100ML-% IV SOLN
2.0000 g | INTRAVENOUS | Status: AC
  Administered 2023-01-10: 2 g via INTRAVENOUS

## 2023-01-09 MED ORDER — BUPIVACAINE LIPOSOME 1.3 % IJ SUSP: 20.0000 mL | Freq: Once | INTRAMUSCULAR | Status: DC

## 2023-01-09 MED ORDER — ORAL CARE MOUTH RINSE: 15.0000 mL | Freq: Once | OROMUCOSAL | Status: AC

## 2023-01-09 MED ORDER — CHLORHEXIDINE GLUCONATE 0.12 % MT SOLN
15.0000 mL | Freq: Once | OROMUCOSAL | Status: AC
  Administered 2023-01-10: 15 mL via OROMUCOSAL

## 2023-01-09 MED ORDER — GABAPENTIN 300 MG PO CAPS
300.0000 mg | ORAL_CAPSULE | ORAL | Status: AC
  Administered 2023-01-10: 300 mg via ORAL

## 2023-01-10 ENCOUNTER — Encounter: Payer: Self-pay | Admitting: Surgery

## 2023-01-10 ENCOUNTER — Ambulatory Visit: Payer: Medicare Other | Admitting: Urgent Care

## 2023-01-10 ENCOUNTER — Encounter
Admission: RE | Disposition: A | Discharge status: Home / Self Care | Payer: Self-pay | Source: Home / Self Care | Attending: Surgery

## 2023-01-10 ENCOUNTER — Other Ambulatory Visit: Payer: Self-pay

## 2023-01-10 ENCOUNTER — Ambulatory Visit
Admission: RE | Admit: 2023-01-10 | Discharge: 2023-01-10 | Disposition: A | Discharge status: Home / Self Care | Payer: Medicare Other | Attending: Surgery | Admitting: Surgery

## 2023-01-10 DIAGNOSIS — G4733 Obstructive sleep apnea (adult) (pediatric): Secondary | ICD-10-CM | POA: Insufficient documentation

## 2023-01-10 DIAGNOSIS — Z8546 Personal history of malignant neoplasm of prostate: Secondary | ICD-10-CM | POA: Diagnosis not present

## 2023-01-10 DIAGNOSIS — I129 Hypertensive chronic kidney disease with stage 1 through stage 4 chronic kidney disease, or unspecified chronic kidney disease: Secondary | ICD-10-CM | POA: Insufficient documentation

## 2023-01-10 DIAGNOSIS — K5730 Diverticulosis of large intestine without perforation or abscess without bleeding: Secondary | ICD-10-CM | POA: Insufficient documentation

## 2023-01-10 DIAGNOSIS — K4030 Unilateral inguinal hernia, with obstruction, without gangrene, not specified as recurrent: Secondary | ICD-10-CM | POA: Diagnosis not present

## 2023-01-10 DIAGNOSIS — K219 Gastro-esophageal reflux disease without esophagitis: Secondary | ICD-10-CM | POA: Insufficient documentation

## 2023-01-10 DIAGNOSIS — K4000 Bilateral inguinal hernia, with obstruction, without gangrene, not specified as recurrent: Secondary | ICD-10-CM | POA: Insufficient documentation

## 2023-01-10 DIAGNOSIS — I441 Atrioventricular block, second degree: Secondary | ICD-10-CM | POA: Diagnosis not present

## 2023-01-10 DIAGNOSIS — I2510 Atherosclerotic heart disease of native coronary artery without angina pectoris: Secondary | ICD-10-CM | POA: Insufficient documentation

## 2023-01-10 DIAGNOSIS — G709 Myoneural disorder, unspecified: Secondary | ICD-10-CM | POA: Insufficient documentation

## 2023-01-10 DIAGNOSIS — K4020 Bilateral inguinal hernia, without obstruction or gangrene, not specified as recurrent: Secondary | ICD-10-CM | POA: Diagnosis present

## 2023-01-10 DIAGNOSIS — N183 Chronic kidney disease, stage 3 unspecified: Secondary | ICD-10-CM | POA: Diagnosis not present

## 2023-01-10 DIAGNOSIS — K449 Diaphragmatic hernia without obstruction or gangrene: Secondary | ICD-10-CM | POA: Diagnosis not present

## 2023-01-10 DIAGNOSIS — Z955 Presence of coronary angioplasty implant and graft: Secondary | ICD-10-CM | POA: Diagnosis not present

## 2023-01-10 DIAGNOSIS — Z01818 Encounter for other preprocedural examination: Secondary | ICD-10-CM

## 2023-01-10 DIAGNOSIS — Z7982 Long term (current) use of aspirin: Secondary | ICD-10-CM | POA: Diagnosis not present

## 2023-01-10 DIAGNOSIS — K4090 Unilateral inguinal hernia, without obstruction or gangrene, not specified as recurrent: Secondary | ICD-10-CM | POA: Diagnosis not present

## 2023-01-10 DIAGNOSIS — E1122 Type 2 diabetes mellitus with diabetic chronic kidney disease: Secondary | ICD-10-CM | POA: Insufficient documentation

## 2023-01-10 DIAGNOSIS — M1990 Unspecified osteoarthritis, unspecified site: Secondary | ICD-10-CM | POA: Insufficient documentation

## 2023-01-10 DIAGNOSIS — Z923 Personal history of irradiation: Secondary | ICD-10-CM | POA: Insufficient documentation

## 2023-01-10 HISTORY — DX: Insomnia, unspecified: G47.00

## 2023-01-10 HISTORY — DX: Long term (current) use of aspirin: Z79.82

## 2023-01-10 HISTORY — PX: INGUINAL HERNIA REPAIR: SHX194

## 2023-01-10 HISTORY — DX: Bilateral inguinal hernia, without obstruction or gangrene, not specified as recurrent: K40.20

## 2023-01-10 HISTORY — DX: Atrioventricular block, second degree: I44.1

## 2023-01-10 HISTORY — DX: Other specified health status: Z78.9

## 2023-01-10 HISTORY — PX: INSERTION OF MESH: SHX5868

## 2023-01-10 LAB — GLUCOSE, CAPILLARY
Glucose-Capillary: 107 mg/dL — ABNORMAL HIGH (ref 70–99)
Glucose-Capillary: 140 mg/dL — ABNORMAL HIGH (ref 70–99)

## 2023-01-10 SURGERY — REPAIR, HERNIA, INGUINAL, ADULT
Anesthesia: General | Site: Inguinal | Laterality: Bilateral

## 2023-01-10 MED ORDER — PROPOFOL 10 MG/ML IV BOLUS
INTRAVENOUS | Status: AC
  Filled 2023-01-10: qty 20

## 2023-01-10 MED ORDER — ONDANSETRON HCL 4 MG/2ML IJ SOLN
INTRAMUSCULAR | Status: DC | PRN
  Administered 2023-01-10: 4 mg via INTRAVENOUS

## 2023-01-10 MED ORDER — ACETAMINOPHEN 500 MG PO TABS: 1000.0000 mg | ORAL_TABLET | Freq: Four times a day (QID) | ORAL | Status: AC | PRN

## 2023-01-10 MED ORDER — ROCURONIUM BROMIDE 10 MG/ML (PF) SYRINGE
PREFILLED_SYRINGE | INTRAVENOUS | Status: AC
  Filled 2023-01-10: qty 10

## 2023-01-10 MED ORDER — DEXAMETHASONE SODIUM PHOSPHATE 10 MG/ML IJ SOLN
INTRAMUSCULAR | Status: DC | PRN
  Administered 2023-01-10: 6 mg via INTRAVENOUS

## 2023-01-10 MED ORDER — PROPOFOL 10 MG/ML IV BOLUS
INTRAVENOUS | Status: DC | PRN
  Administered 2023-01-10: 150 mg via INTRAVENOUS
  Administered 2023-01-10: 50 mg via INTRAVENOUS

## 2023-01-10 MED ORDER — KETAMINE HCL 50 MG/5ML IJ SOSY
PREFILLED_SYRINGE | INTRAMUSCULAR | Status: AC
  Filled 2023-01-10: qty 5

## 2023-01-10 MED ORDER — FENTANYL CITRATE (PF) 100 MCG/2ML IJ SOLN
INTRAMUSCULAR | Status: DC | PRN
  Administered 2023-01-10 (×2): 50 ug via INTRAVENOUS

## 2023-01-10 MED ORDER — LIDOCAINE HCL (CARDIAC) PF 100 MG/5ML IV SOSY
PREFILLED_SYRINGE | INTRAVENOUS | Status: DC | PRN
  Administered 2023-01-10: 100 mg via INTRAVENOUS

## 2023-01-10 MED ORDER — LACTATED RINGERS IV SOLN: INTRAVENOUS | Status: DC | PRN

## 2023-01-10 MED ORDER — ACETAMINOPHEN 500 MG PO TABS
ORAL_TABLET | ORAL | Status: AC
  Filled 2023-01-10: qty 2

## 2023-01-10 MED ORDER — BUPIVACAINE LIPOSOME 1.3 % IJ SUSP
INTRAMUSCULAR | Status: AC
  Filled 2023-01-10: qty 20

## 2023-01-10 MED ORDER — SODIUM CHLORIDE (PF) 0.9 % IJ SOLN
INTRAMUSCULAR | Status: DC | PRN
  Administered 2023-01-10: 60 mL

## 2023-01-10 MED ORDER — CHLORHEXIDINE GLUCONATE 0.12 % MT SOLN
OROMUCOSAL | Status: AC
  Filled 2023-01-10: qty 15

## 2023-01-10 MED ORDER — OXYCODONE HCL 5 MG PO TABS: 5.0000 mg | ORAL_TABLET | ORAL | 0 refills | Status: DC | PRN

## 2023-01-10 MED ORDER — ONDANSETRON HCL 4 MG/2ML IJ SOLN
INTRAMUSCULAR | Status: AC
  Filled 2023-01-10: qty 2

## 2023-01-10 MED ORDER — KETOROLAC TROMETHAMINE 30 MG/ML IJ SOLN
INTRAMUSCULAR | Status: AC
  Filled 2023-01-10: qty 1

## 2023-01-10 MED ORDER — OXYCODONE HCL 5 MG PO TABS: 5.0000 mg | ORAL_TABLET | Freq: Once | ORAL | Status: DC | PRN

## 2023-01-10 MED ORDER — MIDAZOLAM HCL 2 MG/2ML IJ SOLN
INTRAMUSCULAR | Status: DC | PRN
  Administered 2023-01-10: 2 mg via INTRAVENOUS

## 2023-01-10 MED ORDER — LIDOCAINE HCL (PF) 2 % IJ SOLN
INTRAMUSCULAR | Status: AC
  Filled 2023-01-10: qty 5

## 2023-01-10 MED ORDER — KETAMINE HCL 50 MG/5ML IJ SOSY
PREFILLED_SYRINGE | INTRAMUSCULAR | Status: DC | PRN
  Administered 2023-01-10 (×2): 10 mg via INTRAVENOUS
  Administered 2023-01-10: 20 mg via INTRAVENOUS
  Administered 2023-01-10: 10 mg via INTRAVENOUS

## 2023-01-10 MED ORDER — CEFAZOLIN SODIUM-DEXTROSE 2-4 GM/100ML-% IV SOLN
INTRAVENOUS | Status: AC
  Filled 2023-01-10: qty 100

## 2023-01-10 MED ORDER — OXYCODONE HCL 5 MG/5ML PO SOLN: 5.0000 mg | Freq: Once | ORAL | Status: DC | PRN

## 2023-01-10 MED ORDER — ACETAMINOPHEN 10 MG/ML IV SOLN: 1000.0000 mg | Freq: Once | INTRAVENOUS | Status: DC | PRN

## 2023-01-10 MED ORDER — DEXAMETHASONE SODIUM PHOSPHATE 10 MG/ML IJ SOLN
INTRAMUSCULAR | Status: AC
Start: 2023-01-10 — End: ?
  Filled 2023-01-10: qty 1

## 2023-01-10 MED ORDER — BUPIVACAINE HCL (PF) 0.25 % IJ SOLN
INTRAMUSCULAR | Status: AC
  Filled 2023-01-10: qty 30

## 2023-01-10 MED ORDER — IBUPROFEN 600 MG PO TABS: 600.0000 mg | ORAL_TABLET | Freq: Three times a day (TID) | ORAL | 1 refills | Status: AC | PRN

## 2023-01-10 MED ORDER — FENTANYL CITRATE (PF) 100 MCG/2ML IJ SOLN: 25.0000 ug | INTRAMUSCULAR | Status: DC | PRN

## 2023-01-10 MED ORDER — KETOROLAC TROMETHAMINE 30 MG/ML IJ SOLN
INTRAMUSCULAR | Status: DC | PRN
  Administered 2023-01-10: 30 mg via INTRAVENOUS

## 2023-01-10 MED ORDER — DROPERIDOL 2.5 MG/ML IJ SOLN: 0.6250 mg | Freq: Once | INTRAMUSCULAR | Status: DC | PRN

## 2023-01-10 MED ORDER — FENTANYL CITRATE (PF) 100 MCG/2ML IJ SOLN
INTRAMUSCULAR | Status: AC
  Filled 2023-01-10: qty 2

## 2023-01-10 MED ORDER — GABAPENTIN 300 MG PO CAPS
ORAL_CAPSULE | ORAL | Status: AC
  Filled 2023-01-10: qty 1

## 2023-01-10 MED ORDER — SUGAMMADEX SODIUM 200 MG/2ML IV SOLN
INTRAVENOUS | Status: DC | PRN
  Administered 2023-01-10: 154.2 mg via INTRAVENOUS

## 2023-01-10 MED ORDER — 0.9 % SODIUM CHLORIDE (POUR BTL) OPTIME
TOPICAL | Status: DC | PRN
  Administered 2023-01-10: 500 mL

## 2023-01-10 MED ORDER — MIDAZOLAM HCL 2 MG/2ML IJ SOLN
INTRAMUSCULAR | Status: AC
Start: 2023-01-10 — End: ?
  Filled 2023-01-10: qty 2

## 2023-01-10 MED ORDER — ROCURONIUM BROMIDE 100 MG/10ML IV SOLN
INTRAVENOUS | Status: DC | PRN
  Administered 2023-01-10: 10 mg via INTRAVENOUS
  Administered 2023-01-10: 50 mg via INTRAVENOUS

## 2023-01-10 SURGICAL SUPPLY — 42 items
ADH SKN CLS APL DERMABOND .7 (GAUZE/BANDAGES/DRESSINGS) ×1
APL PRP STRL LF DISP 70% ISPRP (MISCELLANEOUS) ×1
BLADE SURG 15 STRL LF DISP TIS (BLADE) ×1 IMPLANT
BLADE SURG 15 STRL SS (BLADE) ×1
CHLORAPREP W/TINT 26 (MISCELLANEOUS) ×1 IMPLANT
DERMABOND ADVANCED .7 DNX12 (GAUZE/BANDAGES/DRESSINGS) ×1 IMPLANT
DRAIN PENROSE 12X.25 LTX STRL (MISCELLANEOUS) ×1 IMPLANT
DRAPE LAPAROTOMY 100X77 ABD (DRAPES) ×1 IMPLANT
ELECT CAUTERY BLADE TIP 2.5 (TIP) ×1
ELECT REM PT RETURN 9FT ADLT (ELECTROSURGICAL) ×1
ELECTRODE CAUTERY BLDE TIP 2.5 (TIP) ×1 IMPLANT
ELECTRODE REM PT RTRN 9FT ADLT (ELECTROSURGICAL) ×1 IMPLANT
GAUZE 4X4 16PLY ~~LOC~~+RFID DBL (SPONGE) ×1 IMPLANT
GLOVE SURG SYN 7.0 (GLOVE) ×1
GLOVE SURG SYN 7.0 PF PI (GLOVE) ×1 IMPLANT
GLOVE SURG SYN 7.5 E (GLOVE) ×1
GLOVE SURG SYN 7.5 PF PI (GLOVE) ×1 IMPLANT
GOWN STRL REUS W/ TWL LRG LVL3 (GOWN DISPOSABLE) ×2 IMPLANT
GOWN STRL REUS W/TWL LRG LVL3 (GOWN DISPOSABLE) ×2
LABEL OR SOLS (LABEL) ×1 IMPLANT
MANIFOLD NEPTUNE II (INSTRUMENTS) ×1 IMPLANT
MESH HERNIA 1.6X1.9 PLUG LRG (Mesh General) IMPLANT
MESH MARLEX PLUG MEDIUM (Mesh General) IMPLANT
NDL HYPO 22X1.5 SAFETY MO (MISCELLANEOUS) ×1 IMPLANT
NEEDLE HYPO 22X1.5 SAFETY MO (MISCELLANEOUS) ×1
NS IRRIG 500ML POUR BTL (IV SOLUTION) ×1 IMPLANT
PACK BASIN MINOR ARMC (MISCELLANEOUS) ×1 IMPLANT
SLEEVE SCD COMPRESS KNEE MED (STOCKING) IMPLANT
SPONGE T-LAP 18X18 ~~LOC~~+RFID (SPONGE) ×1 IMPLANT
SUT MNCRL 4-0 (SUTURE) ×1
SUT MNCRL 4-0 27XMFL (SUTURE) ×1
SUT PROLENE 2 0 SH DA (SUTURE) ×2 IMPLANT
SUT SILK 2 0 (SUTURE) ×1
SUT SILK 2-0 30XBRD TIE 12 (SUTURE) IMPLANT
SUT VIC AB 2-0 CT1 (SUTURE) ×1 IMPLANT
SUT VIC AB 3-0 SH 27 (SUTURE) ×3
SUT VIC AB 3-0 SH 27X BRD (SUTURE) ×1 IMPLANT
SUTURE MNCRL 4-0 27XMF (SUTURE) ×1 IMPLANT
SYR 10ML LL (SYRINGE) ×1 IMPLANT
SYR 30ML LL (SYRINGE) ×1 IMPLANT
TRAP FLUID SMOKE EVACUATOR (MISCELLANEOUS) ×1 IMPLANT
WATER STERILE IRR 500ML POUR (IV SOLUTION) ×1 IMPLANT

## 2023-01-10 NOTE — Op Note (Addendum)
Procedure Date:  01/10/2023  Pre-operative Diagnosis:  Bilateral inguinal hernias, left reducible, right incarcerated.  Post-operative Diagnosis: Bilateral inguinal hernias, left reducible, right incarcerated.  Procedure:  Bilateral Open Inguinal Hernia Repair  Surgeon:  Howie Ill, MD  Anesthesia:  General endotracheal  Estimated Blood Loss:  20 ml  Specimens:  None  Complications:  None  Indications for Procedure:  This is a 77 y.o. male who presents with bilateral inguinal hernias.  He has a history of prostate cancer s/p radiation and also of a penile implant.  As such, we decided to proceed with open surgery instead of minimally invasive.  The options of surgery versus observation were reviewed with the patient and/or family. The risks of bleeding, abscess or infection, recurrence of symptoms, potential for an open procedure, injury to surrounding structures, and chronic pain were all discussed with the patient and was willing to proceed.  Description of Procedure: The patient was correctly identified in the preoperative area and brought into the operating room.  The patient was placed supine with VTE prophylaxis in place.  Appropriate time-outs were performed.  Anesthesia was induced and the patient was intubated.  Appropriate antibiotics were infused.  The lower abdomen including bilateral groin areas were prepped and draped in a sterile fashion. We started on the left side.  An oblique incision was made between the pubic symphysis extending laterally toward the ASIS. Using electrocautery, the subcutaneous tissues were dissected, assuring adequate hemostasis, until reaching the external oblique aponeurosis.  A 1 cm incision was made over the aponeurosis and extended laterally and medially toward the external inguinal ring avoiding injury to the ilioinguinal nerve.  The cord was encircled using a Penrose drain and using medial and lateral retraction, the cord structures were  identified.  The patient had both a direct and indirect hernia.  Both hernia sacs were identified and dissected free.  The sacs were entered and freed of any contents carefully and then ligated with 2-0 Silk tie and resected.  There was also a cord lipoma that was dissected, ligated at its base, and resected.  A large Bard plug was the placed into the direct space and secured using 2-0 Prolene sutures.  Then, a Bard mesh was then placed and sutured to the pubic tubercle, shelving edge, and conjoined tendon with interrupted 2-0 Prolene sutures.  The tails of the mesh were crossed behind the cord and sutured together creating a new internal ring.  The external oblique was then closed in running fashion with 2-0 Vicryl, creating a new external ring.  Exparel solution mixed with 0.5% bupivacaine was infiltrated onto the fascia, subcutaneous tissue, and as a left ilioinguinal block.  The wound was irrigated and the incision was closed in three layers with 3-0 Vicryl and 4-0 Monocryl.  The wound was cleaned and sealed with DermaBond.  We then moved to the right side.  An oblique incision was made between the pubic symphysis extending laterally toward the ASIS. Using electrocautery, the subcutaneous tissues were dissected, assuring adequate hemostasis, until reaching the external oblique aponeurosis.  A 1 cm incision was made over the aponeurosis and extended laterally and medially toward the external inguinal ring avoiding injury to the ilioinguinal nerve.  The cord was encircled using a Penrose drain and using medial and lateral retraction, the cord structures were identified.  The patient had both a direct and indirect hernia.  Both hernia sacs were identified and dissected free.  The direct component was incarcerated, and the fascial defect had  to be opened more.  The sacs were entered and freed of any contents carefully and then ligated with 2-0 Silk tie and resected.  There was also a cord lipoma that was  dissected, ligated at its base, and resected.  A medium Bard plug was the placed into the direct space and secured using 2-0 Prolene sutures.  Then, a Bard mesh was then placed and sutured to the pubic tubercle, shelving edge, and conjoined tendon with interrupted 2-0 Prolene sutures.  The tails of the mesh were crossed behind the cord and sutured together creating a new internal ring.  The external oblique was then closed in running fashion with 2-0 Vicryl, creating a new external ring.  Exparel solution mixed with 0.5% bupivacaine was infiltrated onto the fascia, subcutaneous tissue, and as a right ilioinguinal block. The wound was irrigated and the incision was closed in three layers with 3-0 Vicryl and 4-0 Monocryl.  The wound was cleaned and sealed with DermaBond.  The patient was emerged from anesthesia and extubated and brought to the recovery room for further management.  The patient tolerated the procedure well and all counts were correct at the end of the case.   Howie Ill, MD

## 2023-01-10 NOTE — Transfer of Care (Signed)
Immediate Anesthesia Transfer of Care Note  Patient: Kazden Largo  Procedure(s) Performed: HERNIA REPAIR INGUINAL ADULT, open (Bilateral: Inguinal) INSERTION OF MESH (Bilateral: Inguinal)  Patient Location: PACU  Anesthesia Type:General  Level of Consciousness: drowsy  Airway & Oxygen Therapy: Patient Spontanous Breathing and Patient connected to face mask oxygen  Post-op Assessment: Report given to RN and Post -op Vital signs reviewed and stable  Post vital signs: stable  Last Vitals:  Vitals Value Taken Time  BP 164/79 01/10/23 1053  Temp    Pulse 76 01/10/23 1058  Resp 24 01/10/23 1058  SpO2 99 % 01/10/23 1058  Vitals shown include unfiled device data.  Last Pain:  Vitals:   01/10/23 0652  TempSrc: Temporal  PainSc: 3          Complications: No notable events documented.

## 2023-01-10 NOTE — Discharge Instructions (Signed)
Discharge Instructions: 1.  Patient may shower, but do not scrub wounds heavily and dab dry only. 2.  Do not submerge wounds in pool/tub until fully healed. 3.  Do not apply ointments or hydrogen peroxide to the wounds. 4.  May apply ice packs to the wounds for comfort. 5.  It is normal for there to be bruising or swelling in the groin and scrotal area after surgery.  This will improve on its own. 6.  May resume your Aspirin on 01/12/23. 7.  Do not drive while taking narcotics for pain control.  Prior to driving, make sure you are able to rotate right and left to look at blindspots without significant pain or discomfort. 8.  No heavy lifting or pushing of more than 10-15 lbs for 4 weeks.  May start slowly ramping up activity level after that, and at 6 weeks, may resume all activity without restrictions.

## 2023-01-10 NOTE — Anesthesia Postprocedure Evaluation (Signed)
Anesthesia Post Note  Patient: Jimmy Mcintyre  Procedure(s) Performed: HERNIA REPAIR INGUINAL ADULT, open (Bilateral: Inguinal) INSERTION OF MESH (Bilateral: Inguinal)  Patient location during evaluation: PACU Anesthesia Type: General Level of consciousness: awake and alert Pain management: pain level controlled Vital Signs Assessment: post-procedure vital signs reviewed and stable Respiratory status: spontaneous breathing, nonlabored ventilation and respiratory function stable Cardiovascular status: blood pressure returned to baseline and stable Postop Assessment: no apparent nausea or vomiting Anesthetic complications: no   No notable events documented.   Last Vitals:  Vitals:   01/10/23 1130 01/10/23 1142  BP: (!) 158/75 (!) 157/76  Pulse: 71 69  Resp: (!) 24 20  Temp:  36.6 C  SpO2: 93% 93%    Last Pain:  Vitals:   01/10/23 1142  TempSrc: Temporal  PainSc: 0-No pain                 Foye Deer

## 2023-01-10 NOTE — Progress Notes (Signed)
Patient assisted to bathroom to attempt to void. Patient unable to void at this time. Call bell within reach.

## 2023-01-10 NOTE — H&P (Signed)
01/10/23  History of Present Illness: Jimmy Mcintyre is a 77 y.o. male presenting for follow up of bilateral inguinal hernias.  The patient was last seen on 05/21/2022.  At that time, he had recently undergone POBA of stenosed LAD stent on 04/20/2022 and given that, we wanted to wait a few months before proceeding with any surgical intervention.  Per cardiology, he was supposed to be on aspirin and Plavix but the patient reports that he never took his Plavix as he did not like some of the side effects from it.  As such, he has only been taking aspirin 81 mg daily.  He has not discussed it with his cardiology team.  He last saw his cardiology team on 08/23/2022 and he was referred to electrophysiology for consideration of a pacemaker given Mobitz 2 AV block.  The EP team did not feel that he required a pacemaker at this point.  Otherwise he has been overall cleared for surgery for his inguinal hernias.  The patient denies any worsening symptoms but does feel that the left-sided hernia may have become a little bit bigger.  Still otherwise without any significant pain.  He does report that she feels that the penile implant is no longer working.  He has contacted urology at [facility] but they would not be able to see him until early next year.  For now otherwise he is ready to schedule surgery and would rather try to do it this year given that he has met his deductible for the year.   Past Medical History:     Past Medical History:  Diagnosis Date   Abnormal prostate specific antigen 11/16/2013   Arteriosclerosis of coronary artery 10/29/2014    Overview:  pci stent of lad    Arthritis, degenerative 09/30/2013    Overview:   a.  Shoulders severe.   b.  Cervical spine.   c.  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   Coronary artery disease      Degeneration of lumbar or lumbosacral intervertebral disc     Diabetes mellitus type 2, uncontrolled 11/16/2013   Elevated PSA     Essential (primary) hypertension 10/29/2014   GERD (gastroesophageal reflux disease)     Glaucoma     H/O Mobitz type II block      intermittent   Hypertension     Hypotestosteronemia     Leukocytosis     OSA (obstructive sleep apnea) 04/20/2014    does not use nocturnal PAP therapy   Osteoarthritis     SOB (shortness of breath) 09/30/2013   TI (tricuspid incompetence) 04/29/2014          Past Surgical History:      Past Surgical History:  Procedure Laterality Date   APPENDECTOMY       CHOLECYSTECTOMY       COLONOSCOPY       CORONARY ANGIOPLASTY WITH STENT PLACEMENT   2001   CORONARY BALLOON ANGIOPLASTY N/A 04/20/2022    Procedure: CORONARY BALLOON ANGIOPLASTY;  Surgeon: Alwyn Pea, MD;  Location: [facility] INVASIVE CV LAB;  Service: Cardiovascular;  Laterality: N/A;   ERCP N/A 08/02/2020    Procedure: ENDOSCOPIC RETROGRADE CHOLANGIOPANCREATOGRAPHY (ERCP);  Surgeon: Midge Minium, MD;  Location: [facility] ENDOSCOPY;  Service: Endoscopy;  Laterality: N/A;   LEFT HEART  CATH AND CORONARY ANGIOGRAPHY Left 04/20/2022    Procedure: LEFT HEART CATH AND CORONARY ANGIOGRAPHY;  Surgeon: Alwyn Pea, MD;  Location: [facility] INVASIVE CV LAB;  Service: Cardiovascular;  Laterality: Left;   PENILE PROSTHESIS IMPLANT   2000   REVERSE SHOULDER ARTHROPLASTY Left 01/14/2020    Procedure: REVERSE SHOULDER ARTHROPLASTY;  Surgeon: Francena Hanly, MD;  Location: [facility] ORS;  Service: Orthopedics;  Laterality: Left;    SHOULDER SURGERY Right 2014   TONSILLECTOMY       UPPER GI ENDOSCOPY              Home Medications:        Prior to Admission medications   Medication Sig Start Date End Date Taking? Authorizing Provider  acetaminophen (TYLENOL) 500 MG tablet Take 1,000 mg by mouth every 6 (six) hours as needed for moderate pain.     Yes [provider]   allopurinol (ZYLOPRIM) 300 MG tablet Take 300 mg by mouth every evening. 11/07/17   Yes [provider]  amLODipine-benazepril (LOTREL) 5-20 MG per capsule Take 1 capsule by mouth in the morning. 11/08/13   Yes [provider]  aspirin EC 81 MG tablet Take 1 tablet (81 mg total) by mouth daily. Swallow whole. 07/12/22   Yes Agbor-Etang, Arlys Harshan, MD  cyanocobalamin (,VITAMIN B-12,) 1000 MCG/ML injection Inject 1,000 mcg into the muscle every 30 (thirty) days.  06/23/14   Yes [provider]  finasteride (PROSCAR) 5 MG tablet Take 5 mg by mouth in the morning. 06/23/14   Yes [provider]  gemfibrozil (LOPID) 600 MG tablet Take 600 mg by mouth 2 (two) times daily before a meal.     Yes [provider]  glimepiride (AMARYL) 4 MG tablet Take 1 tablet by mouth daily with breakfast. 06/10/22   Yes [provider]  latanoprost (XALATAN) 0.005 % ophthalmic solution SMARTSIG:In Eye(s) 11/11/22   Yes [provider]  meloxicam (MOBIC) 15 MG tablet Take 15 mg by mouth in the morning. 07/27/20   Yes [provider]  pantoprazole (PROTONIX) 40 MG tablet Take 40 mg by mouth daily. 11/11/22   Yes [provider]  pioglitazone (ACTOS) 30 MG tablet Take 30 mg by mouth daily.     Yes [provider]  SYRINGE/NEEDLE, DISP, 1 ML 25G X 5/8" 1 ML MISC Use 1 Syringe monthly. 09/26/16   Yes [provider]  Testosterone 20.25 MG/1.25GM (1.62%) GEL Place 1 application onto the skin in the morning. Shoulders 06/11/20   Yes [provider]  zolpidem (AMBIEN CR) 12.5 MG CR tablet Take 12.5 mg by mouth at bedtime.  04/27/14   Yes [provider]  ezetimibe (ZETIA) 10 MG tablet Take 1 tablet (10 mg total) by mouth daily. 07/12/22 10/10/22   Debbe Odea, MD      Allergies: Allergies      Allergies  Allergen Reactions   Bactrim [Sulfamethoxazole-Trimethoprim] Rash   Levofloxacin Rash        Review of Systems: Review of  Systems  Constitutional:  Negative for chills and fever.  Respiratory:  Negative for shortness of breath.   Cardiovascular:  Negative for chest pain.  Gastrointestinal:  Negative for abdominal pain, nausea and vomiting.  Skin:  Negative for rash.      Physical Exam BP (!) 154/66   Pulse 61   Temp 98.1 F (36.7 C)   Ht 5\' 9"  (1.753 m)   Wt 170 lb (77.1 kg)   SpO2 98%  BMI 25.10 kg/m  CONSTITUTIONAL: No acute distress, well-nourished HEENT:  Normocephalic, atraumatic, extraocular motion intact. RESPIRATORY:  Lungs are clear, and breath sounds are equal bilaterally. Normal respiratory effort without pathologic use of accessory muscles. CARDIOVASCULAR: Heart is regular without murmurs, gallops, or rubs. GI: Exam deferred today.  Has otherwise bilateral inguinal hernias. NEUROLOGIC:  Motor and sensation is grossly normal.  Cranial nerves are grossly intact. PSYCH:  Alert and oriented to person, place and time. Affect is normal.   Labs/Imaging: Labs from 11/29/22: Sodium 140, potassium 4.6, chloride 105, CO2 26, BUN 28, creatinine 1.1.  LFTs within normal limits.  WBC 5.0, hemoglobin 13.7, hematocrit 42.1, platelets 226.  Hemoglobin A1c 6.3.   CT renal study on 07/15/20: FINDINGS: Evaluation of this exam is limited in the absence of intravenous contrast.   Lower chest: Bibasilar linear atelectasis/scarring. The visualized lung bases are otherwise clear. Partially visualized small pericardial effusion measuring approximately 5 mm in thickness.   No intra-abdominal free air or free fluid.   Hepatobiliary: The liver is unremarkable. No intrahepatic biliary ductal dilatation. Several gallstones. No pericholecystic fluid or evidence of acute cholecystitis. There are multiple stones in the central CBD. The common bile duct is dilated measuring up to 12 mm. MRCP may provide better evaluation of the gallbladder and bile duct.   Pancreas: Unremarkable. No pancreatic ductal dilatation or  surrounding inflammatory changes.   Spleen: Normal in size without focal abnormality.   Adrenals/Urinary Tract: The adrenal glands unremarkable. There is no hydronephrosis or nephrolithiasis on either side. There is a 4 cm right renal posterior interpolar cyst. The visualized ureters appear unremarkable. The urinary bladder is decompressed around a Foley catheter.   Stomach/Bowel: There is sigmoid diverticulosis without active inflammatory changes. There is herniation of a short segment of sigmoid colon into the left inguinal canal, new since the prior CT. No evidence of obstruction or inflammation. There is no bowel obstruction. Appendectomy.   Vascular/Lymphatic: Mild aortoiliac atherosclerotic disease. The IVC is unremarkable. No portal venous gas. There is no adenopathy.   Reproductive: The prostate and seminal vesicles are grossly unremarkable. No pelvic mass. Penile implant with reservoir in the pelvis.   Other: Midline vertical anterior pelvic wall incisional scar. There is a fat containing right inguinal hernia as seen on the prior CT.   Musculoskeletal: Degenerative changes of the spine. No acute osseous pathology.   IMPRESSION: 1. No acute intra-abdominal or pelvic pathology. No hydronephrosis or nephrolithiasis. 2. Cholelithiasis and choledocholithiasis with mild dilatation of the CBD. MRCP may provide better evaluation of the gallbladder and bile duct. 3. Sigmoid diverticulosis. No bowel obstruction or inflammation. 4. Left inguinal hernia containing a short segment of sigmoid colon, new since the prior CT. 5. Aortic Atherosclerosis (ICD10-I70.0).   Assessment and Plan: This is a 77 y.o. male with bilateral inguinal hernias.   - Discussed with patient the rationale for recommending open bilateral inguinal hernia repair.  He has a history of prostate cancer and had radiation therapy and also has had aggressive or placed for his penile implant in the pelvic preperitoneal area.   As such, my concern is that medial area is going to be very scarred down and also the tubing from the reservoir is going to be in the way on the right side.  As such, he could just be best to proceed with open repair on both sides.  Patient is in agreement. - Discussed with him then the plan for an open bilateral inguinal hernia repair and reviewed the  surgery at length with him including the planned incisions, the risks of bleeding, infection, injury to surrounding structures, injury to the tubing for his implant, that this would be an outpatient procedure, postoperative activity restrictions, pain control, and he is willing to proceed. - We will send for cardiology clearance to see if it is possible to pause his aspirin before surgery.  Thankfully the patient should have been on both aspirin and Plavix but for now we will check on clearance for surgery and if it is possible to hold his aspirin. - We will schedule the patient for surgery on 01/10/2023.  All of his questions have been answered.    Howie Ill, MD [facility]

## 2023-01-10 NOTE — Anesthesia Procedure Notes (Signed)
Procedure Name: Intubation Date/Time: 01/10/2023 7:38 AM  Performed by: Emeterio Reeve, CRNAPre-anesthesia Checklist: Patient identified, Emergency Drugs available, Suction available and Patient being monitored Patient Re-evaluated:Patient Re-evaluated prior to induction Oxygen Delivery Method: Circle system utilized Preoxygenation: Pre-oxygenation with 100% oxygen Induction Type: IV induction Ventilation: Mask ventilation without difficulty Laryngoscope Size: Mac and 4 Grade View: Grade I Tube type: Oral Tube size: 7.5 mm Number of attempts: 1 Airway Equipment and Method: Stylet and Oral airway Placement Confirmation: ETT inserted through vocal cords under direct vision, positive ETCO2 and breath sounds checked- equal and bilateral Secured at: 23 cm Tube secured with: Tape Dental Injury: Teeth and Oropharynx as per pre-operative assessment  Comments: Cords clear. CA

## 2023-01-10 NOTE — Anesthesia Preprocedure Evaluation (Signed)
Anesthesia Evaluation  Patient identified by MRN, date of birth, ID band Patient awake    Reviewed: Allergy & Precautions, H&P , NPO status , Patient's Chart, lab work & pertinent test results  Airway Mallampati: II  TM Distance: >3 FB Neck ROM: full    Dental no notable dental hx.    Pulmonary sleep apnea (OSAH)    Pulmonary exam normal        Cardiovascular hypertension, + CAD  Normal cardiovascular exam+ dysrhythmias (Mobitz 1/2 AV block, bradycardia,)    Most recent TTE was performed on 04/04/2022 revealing a normal left ventricular systolic function with an EF of >55%.  There were no regional wall motion abnormalities.  Mild concentric LVH noted.  The left atrium was mildly enlarged.  Aortic, mitral, and tricuspid valve all with thickened leaflets.  There was mitral annular calcification.  Trivial to mild pan valvular regurgitation noted.  RVSP = 36.5 mmHg.  All transvalvular gradients were noted to be normal providing no evidence suggestive of valvular stenosis.  Aorta normal in size with no evidence of aneurysmal dilatation.    Most recent myocardial perfusion imaging study was performed on 04/04/2022 revealing a normal left ventricular systolic function with an EF of 62%.  There were no regional wall motion abnormalities.  Stress images demonstrated a small reversible perfusion abnormality of moderate intensity present in the apical region.  Findings consistent with mild to moderate apical ischemia.  Further evaluation was recommended.    Patient underwent diagnostic LEFT heart catheterization on 04/20/2022 revealing 99% ISR of the previously placed stent to the mid LAD.  LM, LCx, and RCA all with mild luminal irregularities.  PCI was attempted, however interventional cardiologist was unable to cross the lesion.  POBA performed reducing 99% mid LAD lesion down to 25%.  Following angioplasty, TIMI-3 flow was observed.    Long-term  cardiac event monitor study performed on 08/16/2022 revealing a predominant underlying sinus rhythm.  Monitor demonstrated occasional episodes of both Mobitz 1 and Mobitz 2 AV block.  Patient was referred to electrophysiology for further evaluation.  Patient was seen in consult on 11/27/2022 by Dr. Steffanie Dunn; notes reviewed.  MD felt as if Mobitz 1 AV block was present, however rhythm strips showed blocked PACs rather than Mobitz 2 or higher degree block.  Patient asymptomatic.  No indication for permanent pacemaker placement at that time.    Neuro/Psych  Neuromuscular disease (lumbar herniated disc)  negative psych ROS   GI/Hepatic Neg liver ROS, hiatal hernia,GERD  ,,  Endo/Other  diabetes, Well Controlled, Type 2    Renal/GU Renal InsufficiencyRenal disease     Musculoskeletal  (+) Arthritis ,    Abdominal Normal abdominal exam  (+)   Peds  Hematology negative hematology ROS (+)   Anesthesia Other Findings bilateral inguinal hernia, non-recurrent  Past Medical History: No date: Alcohol use No date: Anemia No date: Barrett's esophagus without dysplasia 10/11/2014: Benign essential HTN No date: Bilateral inguinal hernia No date: BPH (benign prostatic hyperplasia) No date: Bradycardia No date: Choledocholithiasis No date: Cholelithiasis No date: CKD (chronic kidney disease), stage III (HCC) 10/29/2014: Combined fat and carbohydrate induced hyperlipemia No date: Coronary artery disease     Comment:  a.) PCI of the LAD in the 1990s done in [place]; b.) MV               10/25/2016: no ischemia; c.) MV 04/04/2022: small,               moderate intensity,  apical perfusion defect c/w mild-mod               ischemia; d.) LHC 04/20/2022: 95-99% ISR mLAD --> unable               to cross lesion --> POBA reducing lesion to 25% No date: Degeneration of lumbar or lumbosacral intervertebral disc 11/16/2013: Diabetes mellitus type 2, uncontrolled No date: ED (erectile dysfunction)      Comment:  a.) s/p penile implant in ~ 2000; b.) on exogenous TRT               (gel) No date: Elevated PSA No date: GERD (gastroesophageal reflux disease) No date: Glaucoma No date: Gout No date: History of hiatal hernia No date: Hypertension No date: Hypotestosteronemia     Comment:  a.) on exogenous TRT (testosterone gel) No date: Insomnia     Comment:  a,) on hypnotic PRN (zolpidem) No date: Leukocytosis No date: Long term current use of aspirin No date: Lumbar stenosis No date: Occassional Mobitz 1 AV block     Comment:  a.) Zio patch study 08/2022 No date: Occassional Mobitz 2 AV block     Comment:  a.) Zio patch study 08/2022; b.) EP consulted 11/2022               --> reviewed tracings and noted blocked PACs rather than               Mobitz 2 or higher degree block; no PPM placement               recommended 04/20/2014: OSA (obstructive sleep apnea)     Comment:  a.) does not use nocturnal PAP therapy No date: Osteoarthritis No date: Prostate cancer (HCC) No date: Scoliosis of thoracic spine No date: Sigmoid diverticulosis 09/30/2013: SOB (shortness of breath) No date: Statin intolerance  Past Surgical History: No date: APPENDECTOMY No date: CHOLECYSTECTOMY No date: COLONOSCOPY No date: CORONARY ANGIOPLASTY WITH STENT PLACEMENT 04/20/2022: CORONARY BALLOON ANGIOPLASTY; N/A     Comment:  Procedure: CORONARY BALLOON ANGIOPLASTY;  Surgeon:               Alwyn Pea, MD;  Location: [facility] INVASIVE CV LAB;               Service: Cardiovascular;  Laterality: N/A; 08/02/2020: ERCP; N/A     Comment:  Procedure: ENDOSCOPIC RETROGRADE               CHOLANGIOPANCREATOGRAPHY (ERCP);  Surgeon: Midge Minium,               MD;  Location: [facility] ENDOSCOPY;  Service: Endoscopy;                Laterality: N/A; 04/20/2022: LEFT HEART CATH AND CORONARY ANGIOGRAPHY; Left     Comment:  Procedure: LEFT HEART CATH AND CORONARY ANGIOGRAPHY;                Surgeon: Alwyn Pea,  MD;  Location: [facility] INVASIVE              CV LAB;  Service: Cardiovascular;  Laterality: Left; 2000: PENILE PROSTHESIS IMPLANT 01/14/2020: REVERSE SHOULDER ARTHROPLASTY; Left     Comment:  Procedure: REVERSE SHOULDER ARTHROPLASTY;  Surgeon:               Francena Hanly, MD;  Location: [facility] ORS;  Service:               Orthopedics;  Laterality: Left;  No date: TONSILLECTOMY No date: TOTAL SHOULDER ARTHROPLASTY; Right No date: UPPER GI ENDOSCOPY     Reproductive/Obstetrics negative OB ROS                              Anesthesia Physical Anesthesia Plan  ASA: 3  Anesthesia Plan: General ETT   Post-op Pain Management: Ofirmev IV (intra-op)* and Regional block*   Induction: Intravenous  PONV Risk Score and Plan: 2 and Ondansetron and Dexamethasone  Airway Management Planned: Oral ETT  Additional Equipment:   Intra-op Plan:   Post-operative Plan: Extubation in OR  Informed Consent: I have reviewed the patients History and Physical, chart, labs and discussed the procedure including the risks, benefits and alternatives for the proposed anesthesia with the patient or authorized representative who has indicated his/her understanding and acceptance.     Dental Advisory Given  Plan Discussed with: CRNA and Surgeon  Anesthesia Plan Comments:          Anesthesia Quick Evaluation

## 2023-01-11 ENCOUNTER — Encounter: Payer: Self-pay | Admitting: Surgery

## 2023-01-23 ENCOUNTER — Ambulatory Visit (INDEPENDENT_AMBULATORY_CARE_PROVIDER_SITE_OTHER): Payer: Medicare Other | Admitting: Surgery

## 2023-01-23 ENCOUNTER — Encounter: Payer: Self-pay | Admitting: Surgery

## 2023-01-23 VITALS — BP 138/68 | HR 63 | Temp 98.0°F | Ht 69.0 in | Wt 171.0 lb

## 2023-01-23 DIAGNOSIS — Z09 Encounter for follow-up examination after completed treatment for conditions other than malignant neoplasm: Secondary | ICD-10-CM

## 2023-01-23 DIAGNOSIS — K4020 Bilateral inguinal hernia, without obstruction or gangrene, not specified as recurrent: Secondary | ICD-10-CM

## 2023-01-23 NOTE — Progress Notes (Signed)
01/23/2023  HPI: Jimmy Mcintyre is a 77 y.o. male s/p open bilateral inguinal hernia repair on 01/10/2023.  He had a reducible left inguinal hernia, both direct and indirect and a right incarcerated inguinal hernia, also both direct and indirect.  Patient presents today for follow-up.  Overall reports expected soreness after surgery which has been improving.  Denies otherwise any nausea or vomiting or difficulty voiding.  He does report some swelling and firmness in both groin areas.  Vital signs: BP 138/68   Pulse 63   Temp 98 F (36.7 C)   Ht 5\' 9"  (1.753 m)   Wt 171 lb (77.6 kg)   SpO2 98%   BMI 25.25 kg/m    Physical Exam: Constitutional: No acute distress Abdomen: Soft, nondistended, with appropriate soreness to palpation.  Bilateral incisions are healing well and are clean, dry, intact with palpable firmness underneath each incision consistent with scar tissue as expected.  The patient also has swelling in bilateral groin areas at the site of the hernias also as expected from the empty space created after reducing both sides and also from post-op inflammatory changes.  No evidence of any hernia recurrence bilaterally.  Assessment/Plan: This is a 77 y.o. male s/p open bilateral inguinal hernia repair.  - Discussed with patient again the findings in his surgery, with him having both direct and indirect hernias bilaterally.  Discussed with him that the swelling and firmness palpable is not expected based on the size of the hernias and also the inflammatory changes after surgery and the scarring formation.  This should be improving with time. - Discussed with him again activity restrictions. - Follow-up as needed or if the swelling and firmness have not improved over the next 4 to 6 weeks.   Howie Ill, MD [facility]

## 2023-01-23 NOTE — Patient Instructions (Signed)

## 2024-02-05 ENCOUNTER — Ambulatory Visit: Admission: RE | Admit: 2024-02-05 | Discharge: 2024-02-05 | Disposition: A | Discharge status: Home / Self Care

## 2024-02-05 ENCOUNTER — Other Ambulatory Visit: Payer: Self-pay

## 2024-02-05 ENCOUNTER — Encounter: Admission: RE | Disposition: A | Discharge status: Home / Self Care | Payer: Self-pay | Source: Home / Self Care

## 2024-02-05 DIAGNOSIS — I200 Unstable angina: Secondary | ICD-10-CM

## 2024-02-05 HISTORY — PX: LEFT HEART CATH AND CORONARY ANGIOGRAPHY: CATH118249

## 2024-02-05 SURGERY — LEFT HEART CATH AND CORONARY ANGIOGRAPHY
Anesthesia: Moderate Sedation | Laterality: Left

## 2024-02-05 MED ORDER — VERAPAMIL HCL 2.5 MG/ML IV SOLN
INTRAVENOUS | Status: AC
  Filled 2024-02-05: qty 2

## 2024-02-05 MED ORDER — ASPIRIN 81 MG PO CHEW
CHEWABLE_TABLET | ORAL | Status: DC
  Filled 2024-02-05: qty 1

## 2024-02-05 MED ORDER — HYDRALAZINE HCL 20 MG/ML IJ SOLN: 10.0000 mg | INTRAMUSCULAR | Status: DC | PRN

## 2024-02-05 MED ORDER — HEPARIN (PORCINE) IN NACL 1000-0.9 UT/500ML-% IV SOLN
INTRAVENOUS | Status: DC | PRN
  Administered 2024-02-05: 1000 mL

## 2024-02-05 MED ORDER — ONDANSETRON HCL 4 MG/2ML IJ SOLN: 4.0000 mg | Freq: Four times a day (QID) | INTRAMUSCULAR | Status: DC | PRN

## 2024-02-05 MED ORDER — LIDOCAINE HCL 1 % IJ SOLN
INTRAMUSCULAR | Status: AC
  Filled 2024-02-05: qty 20

## 2024-02-05 MED ORDER — FREE WATER
500.0000 mL | Freq: Once | Status: AC
  Administered 2024-02-05: 500 mL via ORAL

## 2024-02-05 MED ORDER — IOHEXOL 300 MG/ML  SOLN
INTRAMUSCULAR | Status: DC | PRN
  Administered 2024-02-05: 48 mL

## 2024-02-05 MED ORDER — SODIUM CHLORIDE 0.9 % IV SOLN
250.0000 mL | INTRAVENOUS | Status: DC | PRN
  Administered 2024-02-05: 10 mL via INTRAVENOUS

## 2024-02-05 MED ORDER — HEPARIN SODIUM (PORCINE) 1000 UNIT/ML IJ SOLN
INTRAMUSCULAR | Status: AC
  Filled 2024-02-05: qty 10

## 2024-02-05 MED ORDER — SODIUM CHLORIDE 0.9% FLUSH: 3.0000 mL | Freq: Two times a day (BID) | INTRAVENOUS | Status: DC

## 2024-02-05 MED ORDER — SODIUM CHLORIDE 0.9% FLUSH: 3.0000 mL | INTRAVENOUS | Status: DC | PRN

## 2024-02-05 MED ORDER — MIDAZOLAM HCL 2 MG/2ML IJ SOLN
INTRAMUSCULAR | Status: AC
  Filled 2024-02-05: qty 2

## 2024-02-05 MED ORDER — SODIUM CHLORIDE 0.9 % IV SOLN: 250.0000 mL | INTRAVENOUS | Status: DC | PRN

## 2024-02-05 MED ORDER — ASPIRIN 81 MG PO CHEW
81.0000 mg | CHEWABLE_TABLET | ORAL | Status: AC
  Administered 2024-02-05: 81 mg via ORAL

## 2024-02-05 MED ORDER — FREE WATER: 500.0000 mL | Freq: Once | Status: DC

## 2024-02-05 MED ORDER — HEPARIN SODIUM (PORCINE) 1000 UNIT/ML IJ SOLN
INTRAMUSCULAR | Status: DC | PRN
  Administered 2024-02-05: 5000 [IU] via INTRAVENOUS

## 2024-02-05 MED ORDER — FENTANYL CITRATE (PF) 100 MCG/2ML IJ SOLN
INTRAMUSCULAR | Status: DC | PRN
  Administered 2024-02-05: 50 ug via INTRAVENOUS

## 2024-02-05 MED ORDER — VERAPAMIL HCL 2.5 MG/ML IV SOLN
INTRAVENOUS | Status: DC | PRN
  Administered 2024-02-05: 2.5 mg via INTRA_ARTERIAL

## 2024-02-05 MED ORDER — LIDOCAINE HCL (PF) 1 % IJ SOLN
INTRAMUSCULAR | Status: DC | PRN
  Administered 2024-02-05: 2 mL

## 2024-02-05 MED ORDER — FENTANYL CITRATE (PF) 100 MCG/2ML IJ SOLN
INTRAMUSCULAR | Status: AC
  Filled 2024-02-05: qty 2

## 2024-02-05 MED ORDER — MIDAZOLAM HCL (PF) 2 MG/2ML IJ SOLN
INTRAMUSCULAR | Status: DC | PRN
  Administered 2024-02-05: 1 mg via INTRAVENOUS

## 2024-02-05 MED ORDER — ACETAMINOPHEN 325 MG PO TABS: 650.0000 mg | ORAL_TABLET | ORAL | Status: DC | PRN

## 2024-02-05 MED ORDER — HEPARIN (PORCINE) IN NACL 1000-0.9 UT/500ML-% IV SOLN
INTRAVENOUS | Status: AC
  Filled 2024-02-05: qty 1000

## 2024-02-05 SURGICAL SUPPLY — 9 items
CATH INFINITI 5 FR JL3.5 (CATHETERS) IMPLANT
CATH INFINITI JR4 5F (CATHETERS) IMPLANT
DEVICE RAD TR BAND REGULAR (VASCULAR PRODUCTS) IMPLANT
DRAPE BRACHIAL (DRAPES) IMPLANT
GLIDESHEATH SLEND A-KIT 6F 22G (SHEATH) IMPLANT
GUIDEWIRE INQWIRE 1.5J.035X260 (WIRE) IMPLANT
PACK CARDIAC CATH (CUSTOM PROCEDURE TRAY) ×1 IMPLANT
SET ATX-X65L (MISCELLANEOUS) IMPLANT
STATION PROTECTION PRESSURIZED (MISCELLANEOUS) IMPLANT

## 2024-02-05 NOTE — Discharge Instructions (Signed)
 Radial Site Care Refer to this sheet in the next few weeks. These instructions provide you with information about caring for yourself after your procedure. Your health care provider may also give you more specific instructions. Your treatment has been planned according to current medical practices, but problems sometimes occur. Call your health care provider if you have any problems or questions after your procedure. What can I expect after the procedure? After your procedure, it is typical to have the following: Bruising at the radial site that usually fades within 1-2 weeks. Blood collecting in the tissue (hematoma) that may be painful to the touch. It should usually decrease in size and tenderness within 1-2 weeks.  Follow these instructions at home: Take medicines only as directed by your health care provider. If you are on a medication called Metformin please do not take for 48 hours after your procedure. Over the next 48hrs please increase your fluid intake of water and non caffeine beverages to flush the contrast dye out of your system.  You may shower 24 hours after the procedure  Leave your bandage on and gently wash the site with plain soap and water. Pat the area dry with a clean towel. Do not rub the site, because this may cause bleeding.  Remove your dressing 48hrs after your procedure and leave open to air.  Do not submerge your site in water for 7 days. This includes swimming and washing dishes.  Check your insertion site every day for redness, swelling, or drainage. Do not apply powder or lotion to the site. Do not flex or bend the affected arm for 24 hours or as directed by your health care provider. Do not push or pull heavy objects with the affected arm for 24 hours or as directed by your health care provider. Do not lift over 10 lb (4.5 kg) for 5 days after your procedure or as directed by your health care provider. Ask your health care provider when it is okay to: Return to  work or school. Resume usual physical activities or sports. Resume sexual activity. Do not drive home if you are discharged the same day as the procedure. Have someone else drive you. You may drive 48 hours after the procedure Do not operate machinery or power tools for 24 hours after the procedure. If your procedure was done as an outpatient procedure, which means that you went home the same day as your procedure, a responsible adult should be with you for the first 24 hours after you arrive home. Keep all follow-up visits as directed by your health care provider. This is important. Contact a health care provider if: You have a fever. You have chills. You have increased bleeding from the radial site. Hold pressure on the site. Get help right away if: You have unusual pain at the radial site. You have redness, warmth, or swelling at the radial site. You have drainage (other than a small amount of blood on the dressing) from the radial site. The radial site is bleeding, and the bleeding does not stop after 15 minutes of holding steady pressure on the site. Your arm or hand becomes pale, cool, tingly, or numb. This information is not intended to replace advice given to you by your health care provider. Make sure you discuss any questions you have with your health care provider. Document Released: 03/24/2010 Document Revised: 07/28/2015 Document Reviewed: 09/07/2013 Elsevier Interactive Patient Education  2018 [employer].
